# Patient Record
Sex: Male | Born: 1955 | Race: White | Hispanic: No | Smoking: Current every day smoker
Health system: Southern US, Community
[De-identification: ages and names within clinical notes are randomized; demographics above are authoritative.]

## PROBLEM LIST (undated history)

## (undated) DIAGNOSIS — R0600 Dyspnea, unspecified: Secondary | ICD-10-CM

## (undated) DIAGNOSIS — E785 Hyperlipidemia, unspecified: Secondary | ICD-10-CM

## (undated) DIAGNOSIS — R519 Headache, unspecified: Secondary | ICD-10-CM

## (undated) DIAGNOSIS — I10 Essential (primary) hypertension: Secondary | ICD-10-CM

## (undated) DIAGNOSIS — F32A Depression, unspecified: Secondary | ICD-10-CM

## (undated) DIAGNOSIS — N400 Enlarged prostate without lower urinary tract symptoms: Secondary | ICD-10-CM

## (undated) DIAGNOSIS — M6289 Other specified disorders of muscle: Secondary | ICD-10-CM

## (undated) DIAGNOSIS — Z87891 Personal history of nicotine dependence: Secondary | ICD-10-CM

## (undated) DIAGNOSIS — I639 Cerebral infarction, unspecified: Secondary | ICD-10-CM

## (undated) HISTORY — DX: Hyperlipidemia, unspecified: E78.5

## (undated) HISTORY — DX: Personal history of nicotine dependence: Z87.891

## (undated) HISTORY — PX: OTHER SURGICAL HISTORY: SHX169

## (undated) HISTORY — DX: Cerebral infarction, unspecified: I63.9

---

## 2010-03-01 ENCOUNTER — Emergency Department: Payer: Self-pay | Admitting: Unknown Physician Specialty

## 2012-08-20 DIAGNOSIS — I639 Cerebral infarction, unspecified: Secondary | ICD-10-CM

## 2012-08-20 HISTORY — DX: Cerebral infarction, unspecified: I63.9

## 2013-07-08 ENCOUNTER — Observation Stay: Payer: Self-pay | Admitting: Internal Medicine

## 2013-07-08 LAB — CBC WITH DIFFERENTIAL/PLATELET
Basophil #: 0.1 10*3/uL (ref 0.0–0.1)
Basophil %: 0.5 %
Eosinophil #: 0.2 10*3/uL (ref 0.0–0.7)
Eosinophil %: 1.7 %
HCT: 51.8 % (ref 40.0–52.0)
MCH: 33.3 pg (ref 26.0–34.0)
MCHC: 34.5 g/dL (ref 32.0–36.0)
Monocyte #: 1.1 x10 3/mm — ABNORMAL HIGH (ref 0.2–1.0)
Monocyte %: 8.9 %
Neutrophil #: 7.3 10*3/uL — ABNORMAL HIGH (ref 1.4–6.5)
Neutrophil %: 57.6 %
Platelet: 214 10*3/uL (ref 150–440)
RBC: 5.38 10*6/uL (ref 4.40–5.90)
RDW: 13.3 % (ref 11.5–14.5)
WBC: 12.6 10*3/uL — ABNORMAL HIGH (ref 3.8–10.6)

## 2013-07-08 LAB — URINALYSIS, COMPLETE
Bacteria: NONE SEEN
Bilirubin,UR: NEGATIVE
Blood: NEGATIVE
Glucose,UR: NEGATIVE mg/dL (ref 0–75)
Ketone: NEGATIVE
Nitrite: NEGATIVE
Ph: 6 (ref 4.5–8.0)
RBC,UR: <1 /HPF (ref 0–5)
Specific Gravity: 1.014 (ref 1.003–1.030)
Squamous Epithelial: NONE SEEN

## 2013-07-08 LAB — COMPREHENSIVE METABOLIC PANEL
Anion Gap: 5 — ABNORMAL LOW (ref 7–16)
BUN: 9 mg/dL (ref 7–18)
Calcium, Total: 10.4 mg/dL — ABNORMAL HIGH (ref 8.5–10.1)
Co2: 28 mmol/L (ref 21–32)
EGFR (African American): >60
Glucose: 96 mg/dL (ref 65–99)
SGOT(AST): 24 U/L (ref 15–37)
SGPT (ALT): 23 U/L (ref 12–78)
Sodium: 139 mmol/L (ref 136–145)
Total Protein: 7.9 g/dL (ref 6.4–8.2)

## 2013-07-08 LAB — PROTIME-INR: Prothrombin Time: 13.4 secs (ref 11.5–14.7)

## 2013-07-09 DIAGNOSIS — I6789 Other cerebrovascular disease: Secondary | ICD-10-CM

## 2013-07-09 LAB — LIPID PANEL
Cholesterol: 233 mg/dL — ABNORMAL HIGH (ref 0–200)
HDL Cholesterol: 35 mg/dL — ABNORMAL LOW (ref 40–60)
VLDL Cholesterol, Calc: 19 mg/dL (ref 5–40)

## 2013-08-20 HISTORY — PX: COLONOSCOPY: SHX174

## 2014-04-28 ENCOUNTER — Encounter: Payer: Self-pay | Admitting: General Surgery

## 2014-05-14 ENCOUNTER — Ambulatory Visit: Payer: Self-pay | Admitting: Internal Medicine

## 2014-05-14 LAB — CBC CANCER CENTER
Basophil #: 0.1 x10 3/mm (ref 0.0–0.1)
Basophil %: 0.5 %
Eosinophil #: 0.2 x10 3/mm (ref 0.0–0.7)
Eosinophil %: 1.5 %
HCT: 47.9 % (ref 40.0–52.0)
HGB: 15.8 g/dL (ref 13.0–18.0)
Lymphocyte #: 2.8 x10 3/mm (ref 1.0–3.6)
Lymphocyte %: 20.4 %
MCH: 32.9 pg (ref 26.0–34.0)
MCHC: 33 g/dL (ref 32.0–36.0)
MCV: 100 fL (ref 80–100)
MONOS PCT: 9.8 %
Monocyte #: 1.4 x10 3/mm — ABNORMAL HIGH (ref 0.2–1.0)
Neutrophil #: 9.4 x10 3/mm — ABNORMAL HIGH (ref 1.4–6.5)
Neutrophil %: 67.8 %
PLATELETS: 248 x10 3/mm (ref 150–440)
RBC: 4.81 10*6/uL (ref 4.40–5.90)
RDW: 12.9 % (ref 11.5–14.5)
WBC: 13.9 x10 3/mm — ABNORMAL HIGH (ref 3.8–10.6)

## 2014-05-17 ENCOUNTER — Encounter: Payer: Self-pay | Admitting: General Surgery

## 2014-05-17 ENCOUNTER — Ambulatory Visit (INDEPENDENT_AMBULATORY_CARE_PROVIDER_SITE_OTHER): Payer: Medicaid Other | Admitting: General Surgery

## 2014-05-17 VITALS — BP 120/70 | HR 68 | Resp 14 | Ht 68.0 in | Wt 151.0 lb

## 2014-05-17 DIAGNOSIS — Z1211 Encounter for screening for malignant neoplasm of colon: Secondary | ICD-10-CM

## 2014-05-17 DIAGNOSIS — K4090 Unilateral inguinal hernia, without obstruction or gangrene, not specified as recurrent: Secondary | ICD-10-CM

## 2014-05-17 MED ORDER — POLYETHYLENE GLYCOL 3350 17 GM/SCOOP PO POWD: ORAL | Status: DC

## 2014-05-17 NOTE — Progress Notes (Signed)
Patient ID: Alex Bowers, male   DOB: [birth year], 57 y.o.   MRN: [id number]  Chief Complaint  Patient presents with  . Other    right inguinal and discuss colonoscopy    HPI Alex Bowers is a 58 y.o. male here today for a evalaution of a right inguinal hernia and to discuss a colonoscopy screening. Patient states he has no Gi problems at this time. He noticed the knot in his right inguinal area since 2005 . He states it has got bigger in the last ten years. No pain with activity. No family his of colon cancer.  HPI  Past Medical History  Diagnosis Date  . Hyperlipidemia   . Stroke 2014    Past Surgical History  Procedure Laterality Date  . Teeth removal       Family History  Problem Relation Age of Onset  . Cancer - Other Brother     phageal  . Heart attack Father   . Colon polyps Mother     Social History History  Substance Use Topics  . Smoking status: Current Every Day Smoker -- 2.00 packs/day for 37 years    Types: Cigarettes  . Smokeless tobacco: Never Used  . Alcohol Use: Not on file    No Known Allergies  Current Outpatient Prescriptions  Medication Sig Dispense Refill  . amphetamine-dextroamphetamine (ADDERALL XR) 10 MG 24 hr capsule Take 10 mg by mouth daily.      . Ascorbic Acid (VITAMIN C) 1000 MG tablet Take 1,000 mg by mouth 2 (two) times daily.      Marland Kitchen aspirin EC 81 MG tablet Take 81 mg by mouth daily.      . clopidogrel (PLAVIX) 75 MG tablet Take 75 mg by mouth daily.      . Fish Oil-Cholecalciferol (FISH OIL + D3 PO) Take by mouth.      Marland Kitchen lisinopril (PRINIVIL,ZESTRIL) 10 MG tablet Take 10 mg by mouth daily.      . simvastatin (ZOCOR) 40 MG tablet Take 40 mg by mouth daily.      . polyethylene glycol powder (GLYCOLAX/MIRALAX) powder 255 grams one bottle for colonoscopy prep  255 g  0   No current facility-administered medications for this visit.    Review of Systems Review of Systems  Constitutional: Negative.   Respiratory:  Negative.   Cardiovascular: Negative.   Gastrointestinal: Negative.     Blood pressure 120/70, pulse 68, resp. rate 14, height 5\' 8"  (1.727 m), weight 151 lb (68.493 kg).  Physical Exam Physical Exam  Constitutional: He is oriented to person, place, and time. He appears well-developed and well-nourished.  Eyes: Conjunctivae are normal.  Neck: Neck supple.  Cardiovascular: Normal rate, regular rhythm and normal heart sounds.   Pulmonary/Chest: Effort normal and breath sounds normal.  Abdominal: Soft. Normal appearance and bowel sounds are normal. There is no hepatosplenomegaly or hepatomegaly. There is no tenderness. No hernia ( sizeable right inguinal hernia reducing spontaneously with the patient is in the supine position). Hernia confirmed negative in the left inguinal area.  Neurological: He is alert and oriented to person, place, and time.  Skin: Skin is dry.    Data Reviewed PCP Notes.  Laboratory studies is March 23, 2014 showed white blood cell count 11,900 with normal differential, hemoglobin 15.6. Platelet count 227,000. Creatinine 0.7 with an estimated GFR of 103. Normal electrolytes. Normal liver function studies. Normal B12 and folate. Normal serum iron and ferritin.  Assessment    1) large right inguinal hernia,  reducible. 2) candidate for screening colonoscopy.     Plan    Patient has been scheduled for a colonoscopy and right inguinal hernia repair on 06-09-14 at [facility].  This patient has been asked to stop Plavix one week prior to procedure. He will also stop fish oil one week prior. It is okay for patient to continue 81 mg aspirin.   PCP: Lonna Duval 05/18/2014, 8:35 PM

## 2014-05-17 NOTE — Patient Instructions (Addendum)
Inguinal Hernia, Adult Muscles help keep everything in the body in its proper place. But if a weak spot in the muscles develops, something can poke through. That is called a hernia. When this happens in the lower part of the belly (abdomen), it is called an inguinal hernia. (It takes its name from a part of the body in this region called the inguinal canal.) A weak spot in the wall of muscles lets some fat or part of the small intestine bulge through. An inguinal hernia can develop at any age. Men get them more often than women. CAUSES  In adults, an inguinal hernia develops over time.  It can be triggered by:  Suddenly straining the muscles of the lower abdomen.  Lifting heavy objects.  Straining to have a bowel movement. Difficult bowel movements (constipation) can lead to this.  Constant coughing. This may be caused by smoking or lung disease.  Being overweight.  Being pregnant.  Working at a job that requires long periods of standing or heavy lifting.  Having had an inguinal hernia before. One type can be an emergency situation. It is called a strangulated inguinal hernia. It develops if part of the small intestine slips through the weak spot and cannot get back into the abdomen. The blood supply can be cut off. If that happens, part of the intestine may die. This situation requires emergency surgery. SYMPTOMS  Often, a small inguinal hernia has no symptoms. It is found when a healthcare provider does a physical exam. Larger hernias usually have symptoms.   In adults, symptoms may include:  A lump in the groin. This is easier to see when the person is standing. It might disappear when lying down.  In men, a lump in the scrotum.  Pain or burning in the groin. This occurs especially when lifting, straining or coughing.  A dull ache or feeling of pressure in the groin.  Signs of a strangulated hernia can include:  A bulge in the groin that becomes very painful and tender to the  touch.  A bulge that turns red or purple.  Fever, nausea and vomiting.  Inability to have a bowel movement or to pass gas. DIAGNOSIS  To decide if you have an inguinal hernia, a healthcare provider will probably do a physical examination.  This will include asking questions about any symptoms you have noticed.  The healthcare provider might feel the groin area and ask you to cough. If an inguinal hernia is felt, the healthcare provider may try to slide it back into the abdomen.  Usually no other tests are needed. TREATMENT  Treatments can vary. The size of the hernia makes a difference. Options include:  Watchful waiting. This is often suggested if the hernia is small and you have had no symptoms.  No medical procedure will be done unless symptoms develop.  You will need to watch closely for symptoms. If any occur, contact your healthcare provider right away.  Surgery. This is used if the hernia is larger or you have symptoms.  Open surgery. This is usually an outpatient procedure (you will not stay overnight in a hospital). An cut (incision) is made through the skin in the groin. The hernia is put back inside the abdomen. The weak area in the muscles is then repaired by herniorrhaphy or hernioplasty. Herniorrhaphy: in this type of surgery, the weak muscles are sewn back together. Hernioplasty: a patch or mesh is used to close the weak area in the abdominal wall.  Laparoscopy.   In this procedure, a surgeon makes small incisions. A thin tube with a tiny video camera (called a laparoscope) is put into the abdomen. The surgeon repairs the hernia with mesh by looking with the video camera and using two long instruments. HOME CARE INSTRUCTIONS   After surgery to repair an inguinal hernia:  You will need to take pain medicine prescribed by your healthcare provider. Follow all directions carefully.  You will need to take care of the wound from the incision.  Your activity will be  restricted for awhile. This will probably include no heavy lifting for several weeks. You also should not do anything too active for a few weeks. When you can return to work will depend on the type of job that you have.  During "watchful waiting" periods, you should:  Maintain a healthy weight.  Eat a diet high in fiber (fruits, vegetables and whole grains).  Drink plenty of fluids to avoid constipation. This means drinking enough water and other liquids to keep your urine clear or pale yellow.  Do not lift heavy objects.  Do not stand for long periods of time.  Quit smoking. This should keep you from developing a frequent cough. SEEK MEDICAL CARE IF:   A bulge develops in your groin area.  You feel pain, a burning sensation or pressure in the groin. This might be worse if you are lifting or straining.  You develop a fever of more than 100.5 F (38.1 C). SEEK IMMEDIATE MEDICAL CARE IF:   Pain in the groin increases suddenly.  A bulge in the groin gets bigger suddenly and does not go down.  For men, there is sudden pain in the scrotum. Or, the size of the scrotum increases.  A bulge in the groin area becomes red or purple and is painful to touch.  You have nausea or vomiting that does not go away.  You feel your heart beating much faster than normal.  You cannot have a bowel movement or pass gas.  You develop a fever of more than 102.0 F (38.9 C). Document Released: 12/23/2008 Document Revised: 10/29/2011 Document Reviewed: 12/23/2008 [facility] Patient Information 2015 [place], [place]. This information is not intended to replace advice given to you by your health care provider. Make sure you discuss any questions you have with your health care provider. Colonoscopy A colonoscopy is an exam to look at the entire large intestine (colon). This exam can help find problems such as tumors, polyps, inflammation, and areas of bleeding. The exam takes about 1 hour.  LET [facility]  CARE PROVIDER KNOW ABOUT:   Any allergies you have.  All medicines you are taking, including vitamins, herbs, eye drops, creams, and over-the-counter medicines.  Previous problems you or members of your family have had with the use of anesthetics.  Any blood disorders you have.  Previous surgeries you have had.  Medical conditions you have. RISKS AND COMPLICATIONS  Generally, this is a safe procedure. However, as with any procedure, complications can occur. Possible complications include:  Bleeding.  Tearing or rupture of the colon wall.  Reaction to medicines given during the exam.  Infection (rare). BEFORE THE PROCEDURE   Ask your health care provider about changing or stopping your regular medicines.  You may be prescribed an oral bowel prep. This involves drinking a large amount of medicated liquid, starting the day before your procedure. The liquid will cause you to have multiple loose stools until your stool is almost clear or light green.  This cleans out your colon in preparation for the procedure.  Do not eat or drink anything else once you have started the bowel prep, unless your health care provider tells you it is safe to do so.  Arrange for someone to drive you home after the procedure. PROCEDURE   You will be given medicine to help you relax (sedative).  You will lie on your side with your knees bent.  A long, flexible tube with a light and camera on the end (colonoscope) will be inserted through the rectum and into the colon. The camera sends video back to a computer screen as it moves through the colon. The colonoscope also releases carbon dioxide gas to inflate the colon. This helps your health care provider see the area better.  During the exam, your health care provider may take a small tissue sample (biopsy) to be examined under a microscope if any abnormalities are found.  The exam is finished when the entire colon has been viewed. AFTER THE PROCEDURE     Do not drive for 24 hours after the exam.  You may have a small amount of blood in your stool.  You may pass moderate amounts of gas and have mild abdominal cramping or bloating. This is caused by the gas used to inflate your colon during the exam.  Ask when your test results will be ready and how you will get your results. Make sure you get your test results. Document Released: 08/03/2000 Document Revised: 05/27/2013 Document Reviewed: 04/13/2013 [facility] Patient Information 2015 [place], [place]. This information is not intended to replace advice given to you by your health care provider. Make sure you discuss any questions you have with your health care provider.  Patient has been scheduled for a colonoscopy and right inguinal hernia repair on 06-09-14 at [facility].  This patient has been asked to stop Plavix one week prior to procedure. He will also stop fish oil one week prior. It is okay for patient to continue 81 mg aspirin.

## 2014-05-18 ENCOUNTER — Other Ambulatory Visit: Payer: Self-pay | Admitting: General Surgery

## 2014-05-18 DIAGNOSIS — Z0001 Encounter for general adult medical examination with abnormal findings: Secondary | ICD-10-CM | POA: Insufficient documentation

## 2014-05-18 DIAGNOSIS — K4090 Unilateral inguinal hernia, without obstruction or gangrene, not specified as recurrent: Secondary | ICD-10-CM

## 2014-05-18 DIAGNOSIS — Z1211 Encounter for screening for malignant neoplasm of colon: Secondary | ICD-10-CM

## 2014-05-20 ENCOUNTER — Ambulatory Visit: Payer: Self-pay | Admitting: General Surgery

## 2014-05-20 ENCOUNTER — Ambulatory Visit: Payer: Self-pay | Admitting: Internal Medicine

## 2014-05-26 LAB — CBC CANCER CENTER
Basophil #: 0.1 x10 3/mm (ref 0.0–0.1)
Basophil %: 0.6 %
EOS ABS: 0.3 x10 3/mm (ref 0.0–0.7)
EOS PCT: 2.7 %
HCT: 45 % (ref 40.0–52.0)
HGB: 15.1 g/dL (ref 13.0–18.0)
LYMPHS PCT: 28.5 %
Lymphocyte #: 2.9 x10 3/mm (ref 1.0–3.6)
MCH: 32.9 pg (ref 26.0–34.0)
MCHC: 33.5 g/dL (ref 32.0–36.0)
MCV: 98 fL (ref 80–100)
MONOS PCT: 9.5 %
Monocyte #: 1 x10 3/mm (ref 0.2–1.0)
Neutrophil #: 5.9 x10 3/mm (ref 1.4–6.5)
Neutrophil %: 58.7 %
Platelet: 218 x10 3/mm (ref 150–440)
RBC: 4.58 10*6/uL (ref 4.40–5.90)
RDW: 12.1 % (ref 11.5–14.5)
WBC: 10.1 x10 3/mm (ref 3.8–10.6)

## 2014-06-09 ENCOUNTER — Ambulatory Visit: Payer: Self-pay | Admitting: General Surgery

## 2014-06-09 DIAGNOSIS — K4090 Unilateral inguinal hernia, without obstruction or gangrene, not specified as recurrent: Secondary | ICD-10-CM

## 2014-06-09 DIAGNOSIS — D122 Benign neoplasm of ascending colon: Secondary | ICD-10-CM

## 2014-06-09 HISTORY — PX: HERNIA REPAIR: SHX51

## 2014-06-10 ENCOUNTER — Encounter: Payer: Self-pay | Admitting: General Surgery

## 2014-06-14 ENCOUNTER — Encounter: Payer: Self-pay | Admitting: General Surgery

## 2014-06-16 ENCOUNTER — Ambulatory Visit (INDEPENDENT_AMBULATORY_CARE_PROVIDER_SITE_OTHER): Payer: Self-pay | Admitting: General Surgery

## 2014-06-16 ENCOUNTER — Encounter: Payer: Self-pay | Admitting: General Surgery

## 2014-06-16 VITALS — BP 130/78 | HR 74 | Resp 12 | Ht 68.0 in | Wt 150.0 lb

## 2014-06-16 DIAGNOSIS — K4090 Unilateral inguinal hernia, without obstruction or gangrene, not specified as recurrent: Secondary | ICD-10-CM

## 2014-06-16 NOTE — Patient Instructions (Signed)
Proper lifting techniques reviewed. Patient to return as needed.  

## 2014-06-16 NOTE — Progress Notes (Signed)
Patient ID: Alex Bowers, male   DOB: 02/21/1956, 58 y.o.   MRN: [id number]  Chief Complaint  Patient presents with  . Routine Post Op    inguinal hernia repair    HPI Alex Bowers is a 58 y.o. male. here today for his post op right inguinal hernia repair and colonoscopy done on 06/09/14. Patient states he is doing well .HPI  Past Medical History  Diagnosis Date  . Hyperlipidemia   . Stroke 2014    Past Surgical History  Procedure Laterality Date  . Teeth removal     . Hernia repair Right 06/09/14    inguinal   . Colonoscopy  2015    Family History  Problem Relation Age of Onset  . Cancer - Other Brother     phageal  . Heart attack Father   . Colon polyps Mother     Social History History  Substance Use Topics  . Smoking status: Current Every Day Smoker -- 2.00 packs/day for 37 years    Types: Cigarettes  . Smokeless tobacco: Never Used  . Alcohol Use: Not on file    No Known Allergies  Current Outpatient Prescriptions  Medication Sig Dispense Refill  . amphetamine-dextroamphetamine (ADDERALL XR) 10 MG 24 hr capsule Take 10 mg by mouth daily.      . Ascorbic Acid (VITAMIN C) 1000 MG tablet Take 1,000 mg by mouth 2 (two) times daily.      Marland Kitchen aspirin EC 81 MG tablet Take 81 mg by mouth daily.      . clopidogrel (PLAVIX) 75 MG tablet Take 75 mg by mouth daily.      . Fish Oil-Cholecalciferol (FISH OIL + D3 PO) Take by mouth.      Marland Kitchen lisinopril (PRINIVIL,ZESTRIL) 10 MG tablet Take 10 mg by mouth daily.      . polyethylene glycol powder (GLYCOLAX/MIRALAX) powder 255 grams one bottle for colonoscopy prep  255 g  0  . simvastatin (ZOCOR) 40 MG tablet Take 40 mg by mouth daily.       No current facility-administered medications for this visit.    Review of Systems Review of Systems  Constitutional: Negative.   Respiratory: Negative.   Cardiovascular: Negative.     Blood pressure 130/78, pulse 74, resp. rate 12, height 5\' 8"  (1.727 m), weight  150 lb (68.04 kg).  Physical Exam Physical Exam  Constitutional: He is oriented to person, place, and time. He appears well-developed and well-nourished.  Abdominal:  Incision looks clean and healing well.   Neurological: He is alert and oriented to person, place, and time.  Skin: Skin is warm and dry.    Data Reviewed Polyps removed from the descending colon and from the sigmoid at 40 cm were both tubular adenomas. No high-grade dysplasia.    Assessment    Doing well post hernia repair. Tubular adenomas of the colon.      Plan    Patient to return as needed regarding his hernia. A repeat exam in 5 years will be recommended.      PCP: Vanetta Shawl 06/17/2014, 8:12 PM

## 2014-06-20 ENCOUNTER — Ambulatory Visit: Payer: Self-pay | Admitting: Internal Medicine

## 2014-06-23 ENCOUNTER — Ambulatory Visit: Payer: Self-pay | Admitting: Hospice and Palliative Medicine

## 2014-06-23 LAB — CBC CANCER CENTER
BASOS ABS: 0.1 x10 3/mm (ref 0.0–0.1)
Basophil %: 0.5 %
EOS ABS: 0.3 x10 3/mm (ref 0.0–0.7)
Eosinophil %: 2.3 %
HCT: 47.6 % (ref 40.0–52.0)
HGB: 15.9 g/dL (ref 13.0–18.0)
LYMPHS ABS: 3.2 x10 3/mm (ref 1.0–3.6)
LYMPHS PCT: 26.4 %
MCH: 33.2 pg (ref 26.0–34.0)
MCHC: 33.3 g/dL (ref 32.0–36.0)
MCV: 100 fL (ref 80–100)
MONO ABS: 1.1 x10 3/mm — AB (ref 0.2–1.0)
Monocyte %: 9.1 %
Neutrophil #: 7.4 x10 3/mm — ABNORMAL HIGH (ref 1.4–6.5)
Neutrophil %: 61.7 %
PLATELETS: 228 x10 3/mm (ref 150–440)
RBC: 4.78 10*6/uL (ref 4.40–5.90)
RDW: 12.2 % (ref 11.5–14.5)
WBC: 12.1 x10 3/mm — ABNORMAL HIGH (ref 3.8–10.6)

## 2014-07-08 DIAGNOSIS — D239 Other benign neoplasm of skin, unspecified: Secondary | ICD-10-CM

## 2014-07-08 HISTORY — DX: Other benign neoplasm of skin, unspecified: D23.9

## 2014-07-19 LAB — CBC CANCER CENTER
Basophil #: 0 x10 3/mm (ref 0.0–0.1)
Basophil %: 0.3 %
Eosinophil #: 0.2 x10 3/mm (ref 0.0–0.7)
Eosinophil %: 1.4 %
HCT: 49.1 % (ref 40.0–52.0)
HGB: 16.4 g/dL (ref 13.0–18.0)
Lymphocyte #: 3 x10 3/mm (ref 1.0–3.6)
Lymphocyte %: 23.8 %
MCH: 33 pg (ref 26.0–34.0)
MCHC: 33.3 g/dL (ref 32.0–36.0)
MCV: 99 fL (ref 80–100)
MONO ABS: 1 x10 3/mm (ref 0.2–1.0)
MONOS PCT: 8.3 %
Neutrophil #: 8.3 x10 3/mm — ABNORMAL HIGH (ref 1.4–6.5)
Neutrophil %: 66.2 %
PLATELETS: 210 x10 3/mm (ref 150–440)
RBC: 4.96 10*6/uL (ref 4.40–5.90)
RDW: 12.7 % (ref 11.5–14.5)
WBC: 12.6 x10 3/mm — ABNORMAL HIGH (ref 3.8–10.6)

## 2014-07-20 ENCOUNTER — Ambulatory Visit: Payer: Self-pay | Admitting: Internal Medicine

## 2014-12-10 NOTE — Discharge Summary (Signed)
PATIENT NAME:  [name], Alex Bowers MR#:  [id number] DATE OF BIRTH:  1956/07/26  DATE OF ADMISSION:  07/08/2013 DATE OF DISCHARGE:  07/10/2013  PRIMARY CARE PHYSICIAN: Will be at the [facility].   FINAL DIAGNOSES: 1.  Acute cerebrovascular accident affecting the patient's speech.  2.  Malignant hypertension.  3.  Hyperlipidemia.  4.  Tobacco abuse.   MEDICATIONS ON DISCHARGE: Include aspirin 81 mg daily, nicotine patch 14 mg per 24 hours 1 patch daily, Pravachol 40 mg at bedtime, lisinopril 2.5 mg p.o. daily to be started as outpatient.   DIET: Low-sodium diet, regular consistency.   ACTIVITY: As tolerated.   FOLLOWUP: In 2 to 4 weeks at the [facility].   HOSPITAL COURSE: The patient was admitted July 08, 2013. Was discharged July 10, 2013.   LABORATORY AND RADIOLOGICAL DATA: PT 13.4. INR 1.0, PTT 31.5. Urinalysis negative. Glucose 96, BUN 9, creatinine 0.78, sodium 138, potassium 3.7, chloride 106, CO2 of 28, calcium 10.4. Liver function tests: Normal range. White blood cell count 12.6, hemoglobin and hematocrit 17.9 and 51.8, platelet count of 214. CT scan of the head: Atrophy with mild periventricular small vessel disease. Chest x-ray unremarkable. LDL 179. Echo: EF 60% to 65%, no cardiac source of emboli detected. Ultrasound: Bilateral plaque subjectively, left greater than right, no hemodynamic significance. MRI brain: Acute small nonhemorrhagic infarct posterior aspect of the left lenticular nucleus extending into the posterior aspect of the left corona radiata.  HOSPITAL COURSE PER PROBLEM LIST:  1.  For the patient's acute CVA, this only affected his speech. He was still having some speech issues. He was seen in consultation by speech therapy. No problem swallowing. He was advised to slow down his thoughts and his speaking. Can follow up as outpatient if needed, but I no cost is going to be an issue for him. He was started on aspirin and pravastatin.  2.   Malignant hypertension: No blood pressure medications here in the hospital. Will start low-dose lisinopril 2.5 mg as outpatient. Blood pressure in the hospital as high as 190/109, upon discharge 146/95. 3.  For his hyperlipidemia, LDL was 179. We started pravastatin.  4.  For his tobacco abuse, smoking cessation counseling done 3 minutes by me. Nicotine patch applied.  Time spent on discharge on the day of discharge: Less than 30 minutes.   ____________________________ Tana Conch. Leslye Peer, MD rjw:jcm D: 07/10/2013 17:34:20 ET T: 07/10/2013 18:27:46 ET JOB#: [id number]  cc: Tana Conch. Leslye Peer, MD, <Dictator> [facility] MD ELECTRONICALLY SIGNED 07/14/2013 14:19

## 2014-12-10 NOTE — H&P (Signed)
PATIENT NAME:  Alex Bowers, Alex Bowers MR#:  [id number] DATE OF BIRTH:  1956/02/19  DATE OF ADMISSION:  07/08/2013  PRIMARY CARE PHYSICIAN: Does not have one.   CHIEF COMPLAINT: Slurred speech.   HISTORY OF PRESENT ILLNESS: This is a 59 year old male who presents to the hospital complaining of slurred speech that began yesterday around 4:00 in the morning. The patient says that he thought it would improve, although his slurred speech persisted. He also developed some left upper extremity numbness, and therefore came to the ER for further evaluation. The patient was noted to have systolic blood pressures in triage of greater than 190, and he has no history of hypertension. The patient's symptoms are worrisome for a possible stroke, and therefore  hospitalist services were contacted for further treatment and evaluation. The patient denies any headache. He denies any nausea, vomiting, chest pain, shortness of breath or any other associated symptoms presently.   REVIEW OF SYSTEMS:  CONSTITUTIONAL: No documented fever. No weight gain. No weight loss.  EYES: No blurred or double vision.  ENT: No tinnitus. No postnasal drip. No redness of the oropharynx.  RESPIRATORY: No cough. No wheeze. No hemoptysis. No dyspnea.  CARDIOVASCULAR: No chest pain. No orthopnea. No palpitations or syncope.  GASTROINTESTINAL: No nausea. No vomiting. No diarrhea. No abdominal pain. No melena or hematochezia.  GENITOURINARY: No dysuria or hematuria.  ENDOCRINE: No polyuria or nocturia. No heat or cold intolerance.   HEMATOLOGIC: No anemia. No bruising. No bleeding.  INTEGUMENTARY: No rashes or lesions.  MUSCULOSKELETAL: No arthritis. No swelling. No gout.  NEUROLOGIC: No numbness. No tingling. No ataxia. No seizure-type activity. Positive slurred speech.  PSYCHIATRIC: No anxiety. No insomnia. No ADD.   PAST MEDICAL HISTORY:  He has no significant past medical history.   ALLERGIES: No known drug allergies.   SOCIAL  HISTORY: Does smoke about a pack and a half per day, has been smoking for the past 20 to 30 years. Does drink 4 to 6 beers daily. No other illicit drug abuse. Lives at home by himself.   FAMILY HISTORY: The patient's mother is deceased. She died from complications of liver cancer. Father is alive, has high blood pressure and angina.   CURRENT MEDICATIONS: He is on no medications.   PHYSICAL EXAMINATION: Presently is as follows:  VITAL SIGNS:  Noted to be: Temperature is 97.5, pulse 61, respirations 18, blood pressure 166/96, sats 99% on room air.  GENERAL: The patient is a pleasant-appearing male in no apparent distress.  HEAD, EYES, EARS, NOSE, THROAT: He is atraumatic, normocephalic. Extraocular muscles are intact. Pupils are equal and reactive to light. Sclerae anicteric. No conjunctival injection. No pharyngeal erythema.  NECK: Supple. There is no jugular venous distention. No bruits. No lymphadenopathy or thyromegaly.  HEART: Regular rate and rhythm. No murmurs. No rubs. No clicks.  LUNGS: Clear to auscultation bilaterally. No rales or rhonchi. No wheezes.  ABDOMEN: Soft, flat, nontender, nondistended. Has good bowel sounds. No hepatosplenomegaly appreciated.  EXTREMITIES: No evidence of any cyanosis, clubbing or peripheral edema. Has +2 pedal and radial pulses bilaterally.  NEUROLOGIC: The patient is alert, awake and oriented x 3 with no focal motor or sensory deficits appreciated bilaterally.  SKIN: Moist, warm with no rashes appreciated.  LYMPHATIC: There is no cervical or axillary lymphadenopathy.   LABORATORY DATA: Showed a serum glucose of 96, BUN 9, creatinine 0.7, sodium 139, potassium 3.7, chloride 106, bicarb 28. LFTs are within normal limits. White cell count 12.6, hemoglobin 17.9, hematocrit 51.8, platelet count  214. INR is 1.0. Urinalysis within normal limits.   The patient did have a CT of the head done without contrast, which showed mild atrophy with mild periventricular  small vessel disease, and a chest x-ray which showed no evidence of any acute cardiopulmonary disease.   ASSESSMENT AND PLAN: This is a 59 year old male with a long-term history of tobacco abuse, who presents to the hospital with slurred speech that has been ongoing since yesterday.  1.  Slurred speech/cerebrovascular accident. The patient does have significant risk factors given his family history of stroke and also presented with significantly elevated blood pressures, which have not been treated, as he probably has undiagnosed hypertension. Initial CT head is negative. I will observe him overnight, follow q.4 hour neuro checks. I will get an MRI brain, carotid duplex and echocardiogram. Continue him on his aspirin and check a lipid panel in the morning.  2.  Hypertension. The patient has no significant history of high blood pressure. Will tolerate some elevated BP given the possibility of acute CVA.  Systolic blood pressure goal should be 160. Diastolic about 90. I will place on p.r.n. hydralazine for now. The patient may need to be on some meds prior to discharge.  3.  Tobacco abuse. I will place him on a nicotine patch.   The patient is a FULL CODE.   Time spent on admission is 45 minutes.    ____________________________ Belia Heman. Verdell Carmine, MD vjs:[id number] D: 07/08/2013 20:52:34 ET T: 07/08/2013 21:08:36 ET JOB#: [id number]  cc: Belia Heman. Verdell Carmine, MD, <Dictator> Henreitta Leber MD ELECTRONICALLY SIGNED 07/25/2013 18:32

## 2014-12-11 NOTE — Op Note (Signed)
PATIENT NAME:  Alex Bowers, Alex Bowers MR#:  [id number] DATE OF BIRTH:  1956/01/14  DATE OF PROCEDURE:  06/09/2014  PREOPERATIVE DIAGNOSIS: Right inguinal hernia.   POSTOPERATIVE DIAGNOSIS: Right inguinal hernia, direct.   OPERATIVE PROCEDURE: Right inguinal hernia repair with medium Ultrapro mesh.   SURGEON: Robert Bellow, MD.   ANESTHESIA: General by LMA, Marcaine 0.5% with 1:200,000 units of epinephrine, 30 mL local infiltration, Toradol 30 mg.   CLINICAL NOTE: This 59 year old male has developed a symptomatic right inguinal hernia. He was admitted for elective repair. He underwent colonoscopy today with identification of 2 polyps. He received Kefzol prior to the procedure.   OPERATIVE NOTE: With the patient under adequate general anesthesia, the abdomen and groin were prepped with ChloraPrep and draped. A 5-cm skin line incision along the anticipated course of the inguinal canal was carried down through the skin and subcutaneous tissue with hemostasis achieved by electrocautery. The external oblique was opened in the direction of its fibers and the cord mobilized. Dissection at the level of the internal ring showed no evidence of an indirect defect.   There was a large direct defect. This was dissected free of adjacent structures and the hernia sac excised and discarded. The preperitoneal space was cleared and a medium Ultrapro mesh smoothed into position. The external component was anchored to the pubic tubercle with 0-Surgilon and then along the inguinal ligament with interrupted 0-Surgilon sutures. The medial and superior borders were anchored to the transverse abdominis aponeurosis in a similar fashion. A lateral slit was made for cord passage. Toradol was placed into the wound for postoperative analgesia. The external oblique was closed with a running 2-0 Vicryl suture after return of the iliohypogastric nerve to the field. Scarpa fascia was closed with a running 3-0 Vicryl and the skin  closed with running 4-0 Vicryl subcuticular suture.  A dressing of Benzoin, Steri-Strips, Telfa and Tegaderm was then applied.    The patient tolerated the procedure well and was taken to the recovery room in stable condition.    ____________________________ Robert Bellow, MD jwb:lt D: 06/09/2014 21:14:00 ET T: 06/10/2014 09:36:46 ET JOB#: [id number]  cc: Leighton Ruff. Radford Pax, PA-C Robert Bellow, MD, <Dictator>   JEFFREY Amedeo Kinsman MD ELECTRONICALLY SIGNED 06/11/2014 9:09

## 2014-12-13 LAB — SURGICAL PATHOLOGY

## 2015-03-11 ENCOUNTER — Encounter: Payer: Self-pay | Admitting: *Deleted

## 2015-03-11 ENCOUNTER — Telehealth: Payer: Self-pay | Admitting: *Deleted

## 2015-03-11 NOTE — Telephone Encounter (Signed)
Dr. Marylene Land office note from 07/19/2014 and a letter of release faxed to Dr. Clayborn Bigness and Leta Baptist, P.A. With [facility]. Reminders to check pt's CBC every 6 months (due now) and CT of the chest yearly, included.Marland KitchenMarland Kitchen

## 2015-03-29 IMAGING — CT CT CHEST W/O CM SCREENING
2 of 3 series · 15 of 36 positions shown, 18 images · non-contrast
Comparison: None.

CLINICAL DATA: Current smoker, 45 pack-year history, lung cancer
screening.

EXAM:
CT CHEST WITHOUT CONTRAST
TECHNIQUE: Multidetector CT imaging of the chest was performed following the
standard protocol without IV contrast..

[Series 2: routine chest wo · axial · 0.70mm/px · z∈[-625,-360]mm · 12 of 63 slices shown, 15 images]
[im 5/63  mediastinal]
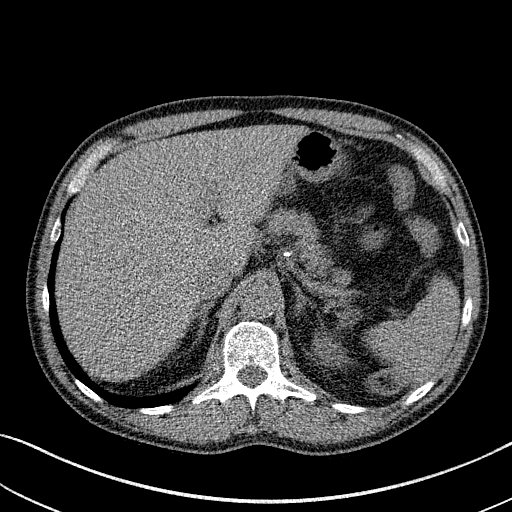
[im 5/63  lung]
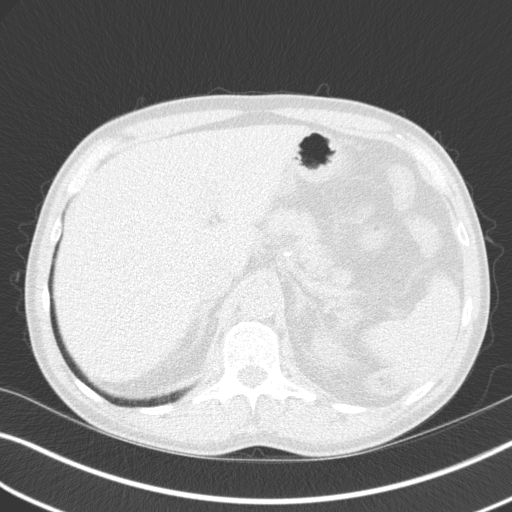
[im 10/63  lung]
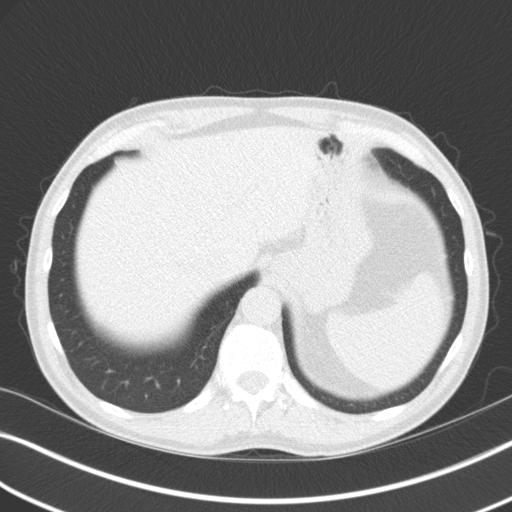
[im 14/63  lung]
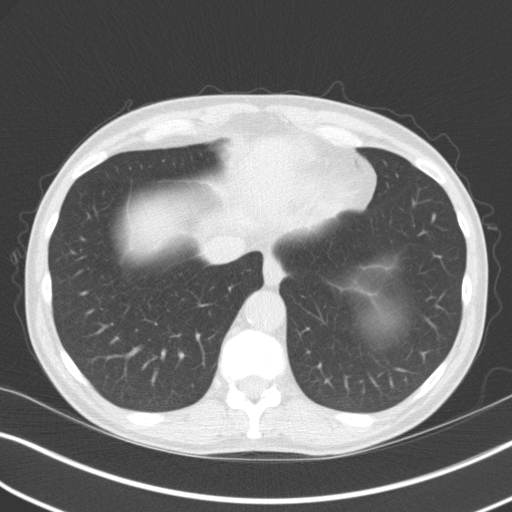
[im 19/63  lung]
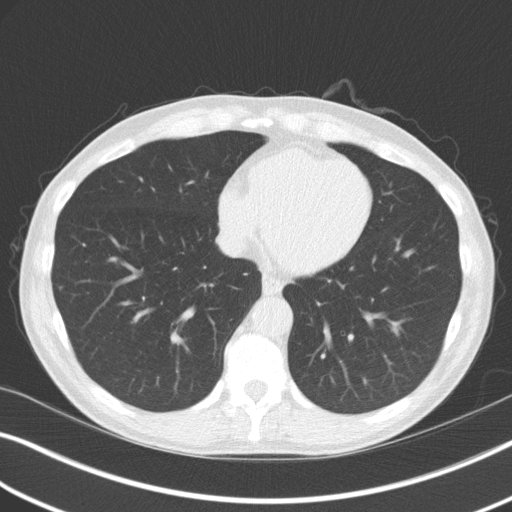
[im 23/63  mediastinal]
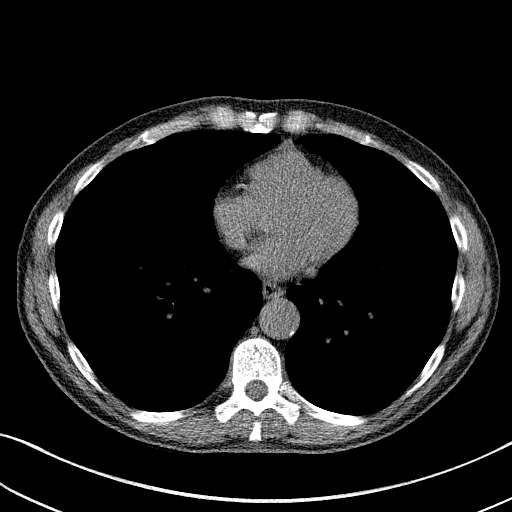
[im 23/63  lung]
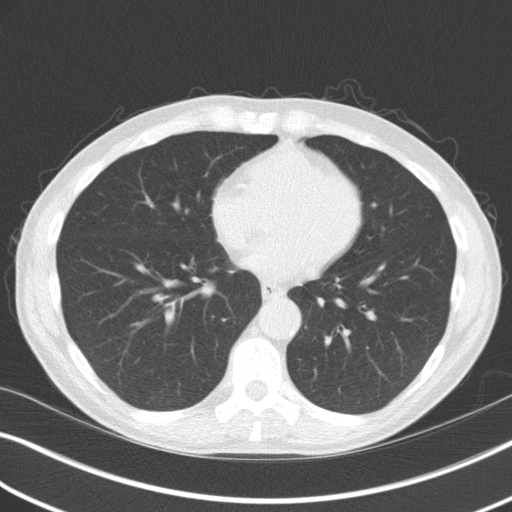
[im 28/63  lung]
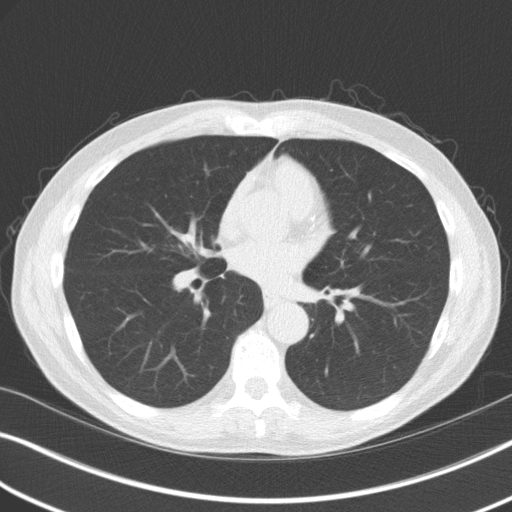
[im 35/63  lung]
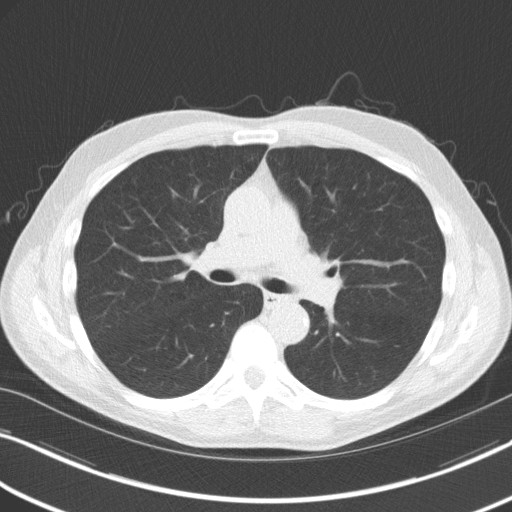
[im 40/63  lung]
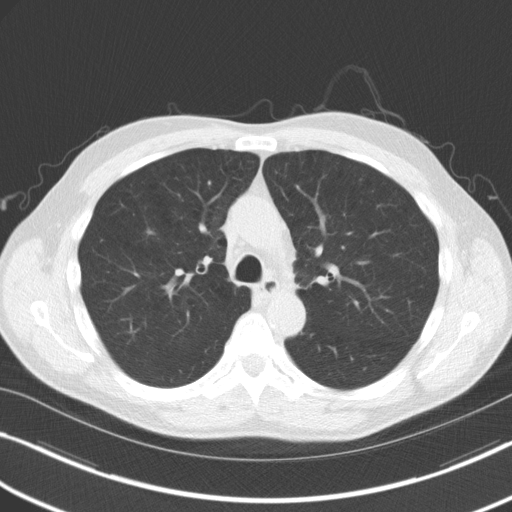
[im 44/63  mediastinal]
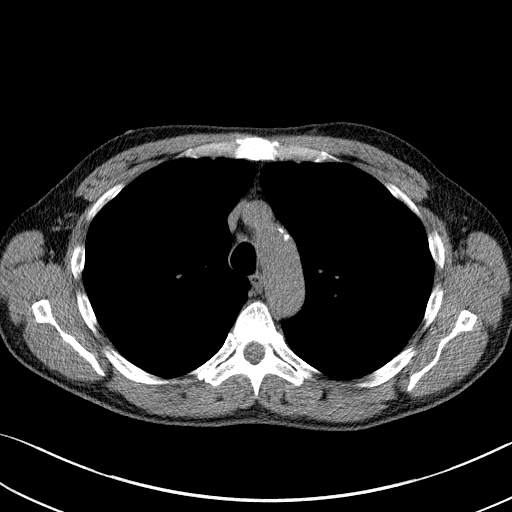
[im 44/63  lung]
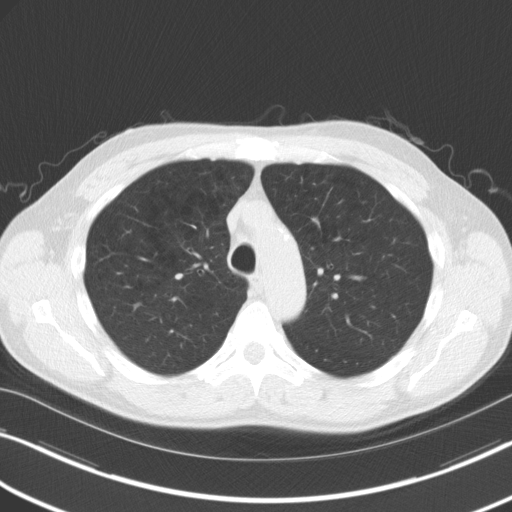
[im 49/63  lung]
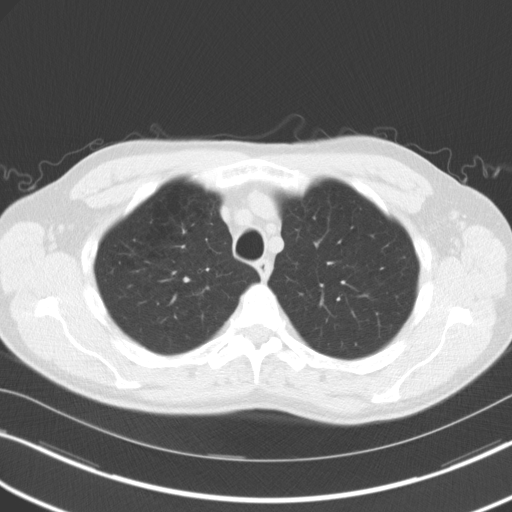
[im 53/63  lung]
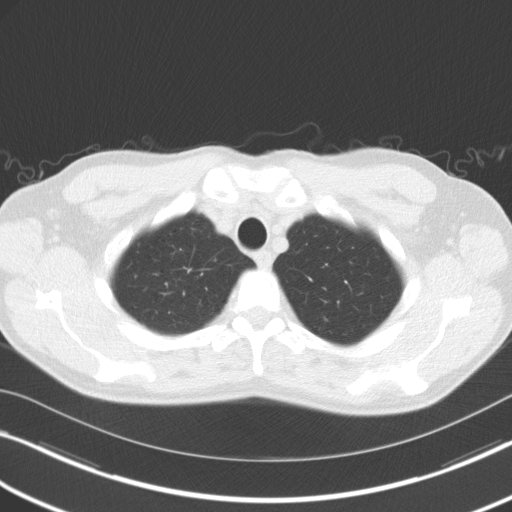
[im 58/63  lung]
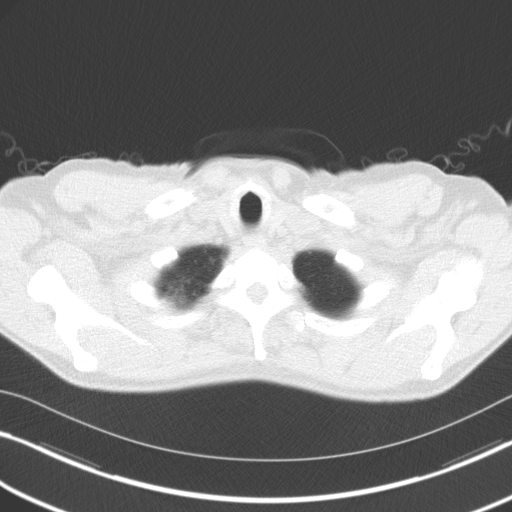

[Series 5: routine chest wo cor · coronal · 0.63mm/px · 3 of 267 slices shown]
[im 54/267  lung]
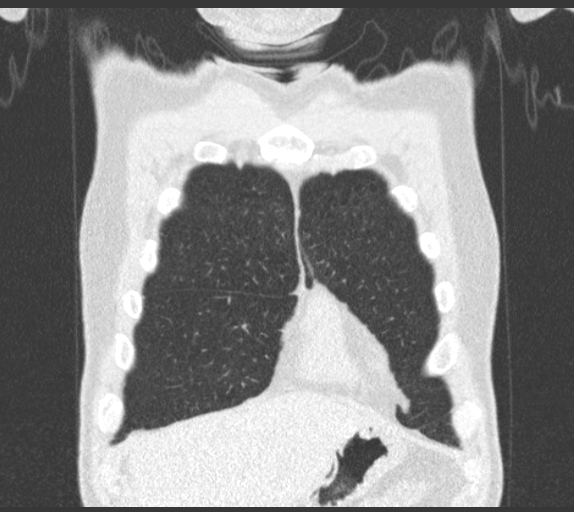
[im 107/267  lung]
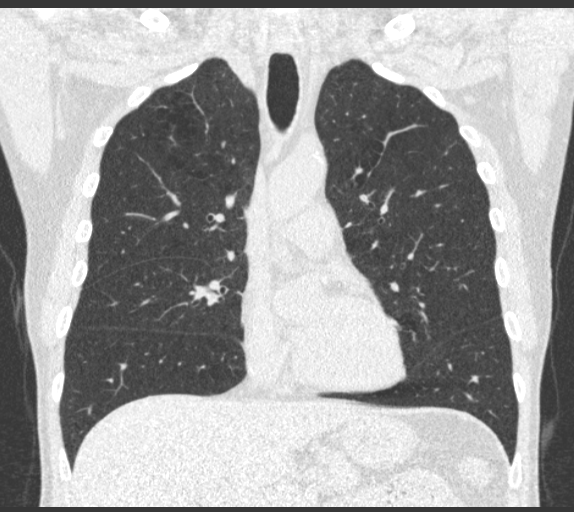
[im 160/267  lung]
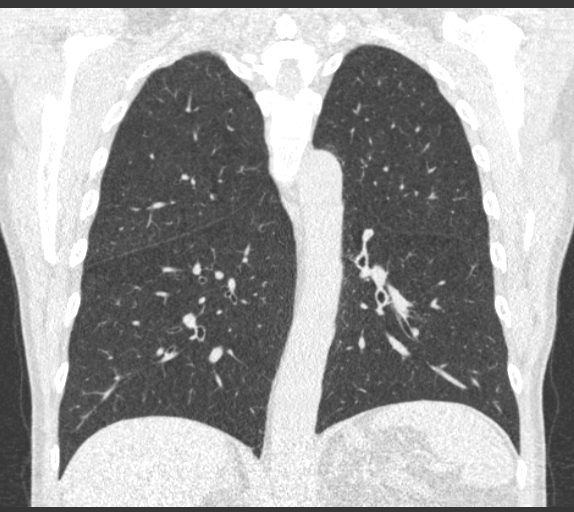

[15 of 36 positions shown; findings below may reference images not displayed]

FINDINGS: No pathologically enlarged mediastinal or axillary lymph nodes.
Hilar regions are difficult to definitively evaluate without IV
contrast but appear grossly unremarkable. Coronary artery
calcification. Heart size normal. No pericardial effusion.

Biapical pleural parenchymal scarring. Mild centrilobular emphysema.
Scattered pulmonary nodular densities measure 4 mm or less in size.
No pleural fluid. Airway is unremarkable.

Incidental imaging of the upper abdomen shows the visualized
portions of the liver, adrenal glands, kidneys, spleen, pancreas,
stomach and bowel to be grossly unremarkable. No upper abdominal
adenopathy.

No worrisome lytic or sclerotic lesions. Degenerative changes are
seen in the spine.
IMPRESSION: 1. Lung-RADS Category 2, benign appearance or behavior. Continue
annual screening with low-dose chest CT without contrast in 12
months. Smoking cessation counseling is also recommended, if not
already performed.
2. Coronary artery calcification.

## 2015-06-16 ENCOUNTER — Ambulatory Visit (INDEPENDENT_AMBULATORY_CARE_PROVIDER_SITE_OTHER): Payer: Medicaid Other | Admitting: Obstetrics and Gynecology

## 2015-06-16 ENCOUNTER — Encounter: Payer: Self-pay | Admitting: Obstetrics and Gynecology

## 2015-06-16 VITALS — BP 117/79 | HR 90 | Resp 16 | Ht 68.0 in | Wt 141.8 lb

## 2015-06-16 DIAGNOSIS — N400 Enlarged prostate without lower urinary tract symptoms: Secondary | ICD-10-CM | POA: Diagnosis not present

## 2015-06-16 LAB — MICROSCOPIC EXAMINATION
Epithelial Cells (non renal): NONE SEEN /hpf (ref 0–10)
RBC, UA: NONE SEEN /hpf (ref 0–?)
Renal Epithel, UA: NONE SEEN /hpf

## 2015-06-16 LAB — URINALYSIS, COMPLETE
BILIRUBIN UA: NEGATIVE
GLUCOSE, UA: NEGATIVE
KETONES UA: NEGATIVE
Nitrite, UA: NEGATIVE
Protein, UA: NEGATIVE
RBC UA: NEGATIVE
Specific Gravity, UA: >1.03 — ABNORMAL HIGH (ref 1.005–1.030)
UUROB: 0.2 mg/dL (ref 0.2–1.0)
pH, UA: 5.5 (ref 5.0–7.5)

## 2015-06-16 LAB — BLADDER SCAN AMB NON-IMAGING: Scan Result: 12

## 2015-06-16 MED ORDER — SILDENAFIL CITRATE 100 MG PO TABS: 100.0000 mg | ORAL_TABLET | Freq: Every day | ORAL | Status: DC | PRN

## 2015-06-16 NOTE — Progress Notes (Signed)
06/16/2015 4:01 PM   Alex Bowers 12/30/1955 [id number]  Referring provider: [name], MD [address] [place], [place] [address]  Chief Complaint  Patient presents with  . Benign Prostatic Hypertrophy  . Erectile Dysfunction  . Establish Care    HPI: Patient is a 59yo male presenting today as a referral from his PCP with c/o ED and BPH with LUTS. Patient self reports that he has severe attention deficit disorder and it was difficult to obtain a detailed history of urologic issues during his visit.    1. BPH with LUTS Most bothersome urinary symptoms are frequency and urgency worsening over the last year.   I-PSS 20 QOL 4  2. Erectile Dysfunction- Patient reports inability to achieve erection over the last year. Denies penile pain or injury. May have been diagnosed with Peyronie's in the past. He states that he does not currently have a sexual partner but that he would like to have a  Medication available if needed.  SHIM 17  Current every day smoker x 30 years approximately 1ppd.  History of hypogonadism treated with androgel by PCP. Last available testosterone drawn by PCP was WNL at 512.   PMH: Past Medical History  Diagnosis Date  . Hyperlipidemia   . Stroke [facility]) 2014    Surgical History: Past Surgical History  Procedure Laterality Date  . Teeth removal     . Hernia repair Right 06/09/14    inguinal   . Colonoscopy  2015    Home Medications:    Medication List       This list is accurate as of: 06/16/15 11:59 PM.  Always use your most recent med list.               amphetamine-dextroamphetamine 10 MG 24 hr capsule  Commonly known as:  ADDERALL XR  Take 10 mg by mouth daily.     ANDROGEL PUMP 20.25 MG/ACT (1.62%) Gel  Generic drug:  Testosterone  APPLY 1 PUMP TO EACH UNDERARM EVERY MORNING     aspirin EC 81 MG tablet  Take 81 mg by mouth daily.     citalopram 20 MG tablet  Commonly known as:  CELEXA  Take 20 mg by mouth daily.     clopidogrel 75 MG tablet  Commonly known as:  PLAVIX  Take 75 mg by mouth daily.     FISH OIL + D3 PO  Take by mouth.     lisinopril 10 MG tablet  Commonly known as:  PRINIVIL,ZESTRIL  Take 10 mg by mouth daily.     sildenafil 100 MG tablet  Commonly known as:  VIAGRA  Take 1 tablet (100 mg total) by mouth daily as needed for erectile dysfunction.     simvastatin 40 MG tablet  Commonly known as:  ZOCOR  Take 40 mg by mouth daily.     vitamin C 1000 MG tablet  Take 1,000 mg by mouth 2 (two) times daily.        Allergies: No Known Allergies  Family History: Family History  Problem Relation Age of Onset  . Cancer - Other Brother     phageal  . Heart attack Father   . Colon polyps Mother     Social History:  reports that he has been smoking Cigarettes.  He has a 74 pack-year smoking history. He has never used smokeless tobacco. He reports that he does not use illicit drugs. His alcohol history is not on file.  ROS: UROLOGY Frequent Urination?:  Yes Hard to postpone urination?: No Burning/pain with urination?: No Get up at night to urinate?: Yes Leakage of urine?: No Urine stream starts and stops?: No Trouble starting stream?: Yes Do you have to strain to urinate?: No Blood in urine?: No Urinary tract infection?: No Sexually transmitted disease?: No Injury to kidneys or bladder?: No Painful intercourse?: No Weak stream?: Yes Erection problems?: Yes Penile pain?: No  Gastrointestinal Nausea?: No Vomiting?: No Indigestion/heartburn?: No Diarrhea?: No Constipation?: No  Constitutional Fever: No Night sweats?: No Weight loss?: No Fatigue?: No  Skin Skin rash/lesions?: No Itching?: No  Eyes Blurred vision?: No Double vision?: No  Ears/Nose/Throat Sore throat?: No Sinus problems?: No  Hematologic/Lymphatic Swollen glands?: No Easy bruising?: Yes  Cardiovascular Leg swelling?: No Chest pain?: No  Respiratory Cough?: Yes Shortness of  breath?: No  Endocrine Excessive thirst?: No  Musculoskeletal Back pain?: No Joint pain?: No  Neurological Headaches?: No Dizziness?: No  Psychologic Depression?: Yes Anxiety?: No  Physical Exam: BP 117/79 mmHg  Pulse 90  Resp 16  Ht 5\' 8"  (1.727 m)  Wt 141 lb 12.8 oz (64.32 kg)  BMI 21.57 kg/m2  Constitutional:  Alert and oriented, No acute distress. HEENT: [place] AT, moist mucus membranes.  Trachea midline, no masses. Cardiovascular: No clubbing, cyanosis, or edema. Respiratory: Normal respiratory effort, no increased work of breathing. GI: Abdomen is soft, nontender, nondistended, no abdominal masses GU: No CVA tenderness, circumcised phallus, subtle left sided calcification mid penile shaft, normal scrotum  DRE: +2 smooth prostate Skin: No rashes, bruises or suspicious lesions. Lymph: No cervical or inguinal adenopathy. Neurologic: Grossly intact, no focal deficits, moving all 4 extremities. Psychiatric: Normal mood and affect.  Laboratory Data:   Urinalysis    Component Value Date/Time   COLORURINE Yellow 07/08/2013 1744   APPEARANCEUR Clear 07/08/2013 1744   LABSPEC 1.014 07/08/2013 1744   PHURINE 6.0 07/08/2013 1744   GLUCOSEU Negative 06/16/2015 1422   GLUCOSEU Negative 07/08/2013 1744   HGBUR Negative 07/08/2013 1744   BILIRUBINUR Negative 06/16/2015 1422   BILIRUBINUR Negative 07/08/2013 1744   KETONESUR Negative 07/08/2013 1744   PROTEINUR Negative 07/08/2013 1744   NITRITE Negative 06/16/2015 1422   NITRITE Negative 07/08/2013 1744   LEUKOCYTESUR 1+* 06/16/2015 1422   LEUKOCYTESUR Negative 07/08/2013 1744    Pertinent Imaging:   Assessment & Plan:    1. BPH (benign prostatic hyperplasia)-  Patient was started on Flomax by his PCP.  Took his first pill today. Will f/u in 2 months and reassess symptoms. - Urinalysis, Complete - BLADDER SCAN AMB NON-IMAGING  2. Erectile Dysfunction- Patient provided samples of Viagra 100mg  and instructed to  take 1/2 tablet and increase to 1 tablet if needed.   Return in about 2 months (around 08/16/2015) for f/u BPH/ED.  These notes generated with voice recognition software. I apologize for typographical errors.  Herbert Moors, [facility] [address], [place], [place] [address] [phone]

## 2015-08-08 ENCOUNTER — Telehealth: Payer: Self-pay | Admitting: *Deleted

## 2015-08-08 NOTE — Telephone Encounter (Signed)
Left voicemail for patient notifyng them that it is time to schedule annual low dose lung cancer screening CT scan. Instructed patient to call back to verify information prior to the scan being scheduled.  

## 2015-08-18 ENCOUNTER — Encounter: Payer: Self-pay | Admitting: Obstetrics and Gynecology

## 2015-08-18 ENCOUNTER — Ambulatory Visit (INDEPENDENT_AMBULATORY_CARE_PROVIDER_SITE_OTHER): Payer: Medicaid Other | Admitting: Obstetrics and Gynecology

## 2015-08-18 VITALS — BP 134/85 | HR 74 | Resp 16 | Ht 68.0 in | Wt 145.7 lb

## 2015-08-18 DIAGNOSIS — N529 Male erectile dysfunction, unspecified: Secondary | ICD-10-CM | POA: Diagnosis not present

## 2015-08-18 DIAGNOSIS — N400 Enlarged prostate without lower urinary tract symptoms: Secondary | ICD-10-CM

## 2015-08-18 DIAGNOSIS — R3129 Other microscopic hematuria: Secondary | ICD-10-CM

## 2015-08-18 LAB — MICROSCOPIC EXAMINATION: Epithelial Cells (non renal): NONE SEEN /hpf (ref 0–10)

## 2015-08-18 LAB — URINALYSIS, COMPLETE
Bilirubin, UA: NEGATIVE
Glucose, UA: NEGATIVE
Ketones, UA: NEGATIVE
LEUKOCYTES UA: NEGATIVE
Nitrite, UA: NEGATIVE
PH UA: 5 (ref 5.0–7.5)
RBC, UA: NEGATIVE
Specific Gravity, UA: >1.03 — ABNORMAL HIGH (ref 1.005–1.030)
Urobilinogen, Ur: 0.2 mg/dL (ref 0.2–1.0)

## 2015-08-18 NOTE — Progress Notes (Signed)
4:45 PM   Alex Bowers February 06, 1956 [id number]  Referring provider: [name], MD [address] [place], [place] [address]  Chief Complaint  Patient presents with  . Benign Prostatic Hypertrophy  . Erectile Dysfunction    HPI: Patient is a 59yo male presenting today as a referral from his PCP with c/o ED and BPH with LUTS. Patient self reports that he has severe attention deficit disorder and it was difficult to obtain a detailed history of urologic issues during his visit.    1. BPH with LUTS- Most bothersome urinary symptoms are frequency and urgency worsening over the last year.   I-PSS 20 QOL 4  2. Erectile Dysfunction- Patient reports inability to achieve erection over the last year. Denies penile pain or injury. May have been diagnosed with Peyronie's in the past. He states that he does not currently have a sexual partner but that he would like to have a  Medication available if needed.  SHIM 17  Current every day smoker x 30 years approximately 1ppd.  History of hypogonadism treated with androgel by PCP. Last available testosterone drawn by PCP was WNL at 512.   Current Status: I-PSS 12 QOL-mostly dissatisfied  Continuing to smoke heavily. States he cannot "get it in his mind to quit". He states that he tried a whole pill of Viagra and experienced sweating and dizziness. He is unsure if it improved his ability to achieve an erection because he felt so poorly. He was instructed to take only half a pill at his last visit but he states that he misunderstood directions. He states that his primary care provider discontinued his Flomax because he was experiencing leg cramping and was thought it was a side effect of his medication. He reports that his creatinine has improved since discontinuing the Flomax. He he has not noticed significant worsening of his urinary symptoms since discontinuing his Flomax.  His IP SS score has improved since his last visit.  PMH: Past  Medical History  Diagnosis Date  . Hyperlipidemia   . Stroke [facility]) 2014    Surgical History: Past Surgical History  Procedure Laterality Date  . Teeth removal     . Hernia repair Right 06/09/14    inguinal   . Colonoscopy  2015    Home Medications:    Medication List       This list is accurate as of: 08/18/15  4:45 PM.  Always use your most recent med list.               amphetamine-dextroamphetamine 10 MG 24 hr capsule  Commonly known as:  ADDERALL XR  Take 10 mg by mouth daily.     ANDROGEL PUMP 20.25 MG/ACT (1.62%) Gel  Generic drug:  Testosterone  APPLY 1 PUMP TO EACH UNDERARM EVERY MORNING     aspirin EC 81 MG tablet  Take 81 mg by mouth daily.     citalopram 20 MG tablet  Commonly known as:  CELEXA  Take 20 mg by mouth daily.     clopidogrel 75 MG tablet  Commonly known as:  PLAVIX  Take 75 mg by mouth daily.     FISH OIL + D3 PO  Take by mouth.     lisinopril 10 MG tablet  Commonly known as:  PRINIVIL,ZESTRIL  Take 10 mg by mouth daily.     sildenafil 100 MG tablet  Commonly known as:  VIAGRA  Take 1 tablet (100 mg total) by mouth daily as needed for  erectile dysfunction.     simvastatin 40 MG tablet  Commonly known as:  ZOCOR  Take 40 mg by mouth daily.     tamsulosin 0.4 MG Caps capsule  Commonly known as:  FLOMAX  Take 0.4 mg by mouth daily. Reported on 08/18/2015     vitamin C 1000 MG tablet  Take 1,000 mg by mouth 2 (two) times daily.        Allergies: No Known Allergies  Family History: Family History  Problem Relation Age of Onset  . Cancer - Other Brother     phageal  . Heart attack Father   . Colon polyps Mother     Social History:  reports that he has been smoking Cigarettes.  He has a 74 pack-year smoking history. He has never used smokeless tobacco. He reports that he does not use illicit drugs. His alcohol history is not on file.  ROS: UROLOGY Frequent Urination?: Yes Hard to postpone urination?:  No Burning/pain with urination?: No Get up at night to urinate?: No Leakage of urine?: No Urine stream starts and stops?: No Trouble starting stream?: No Do you have to strain to urinate?: No Blood in urine?: No Urinary tract infection?: No Sexually transmitted disease?: No Injury to kidneys or bladder?: No Painful intercourse?: No Weak stream?: No Erection problems?: Yes Penile pain?: No  Gastrointestinal Nausea?: No Vomiting?: No Indigestion/heartburn?: No Diarrhea?: No Constipation?: No  Constitutional Fever: No Night sweats?: No Weight loss?: No Fatigue?: No  Skin Skin rash/lesions?: No Itching?: No  Eyes Blurred vision?: No Double vision?: No  Ears/Nose/Throat Sore throat?: No Sinus problems?: No  Hematologic/Lymphatic Swollen glands?: No Easy bruising?: No  Cardiovascular Leg swelling?: No Chest pain?: No  Respiratory Cough?: Yes Shortness of breath?: No  Endocrine Excessive thirst?: No  Musculoskeletal Back pain?: No Joint pain?: No  Neurological Headaches?: No Dizziness?: No  Psychologic Depression?: Yes Anxiety?: No  Physical Exam: BP 134/85 mmHg  Pulse 74  Resp 16  Ht 5\' 8"  (1.727 m)  Wt 145 lb 11.2 oz (66.089 kg)  BMI 22.16 kg/m2  Constitutional:  Alert and oriented, No acute distress. HEENT: [place] AT, moist mucus membranes.  Trachea midline, no masses. Cardiovascular: No clubbing, cyanosis, or edema. Respiratory: Normal respiratory effort, no increased work of breathing. GI: Abdomen is soft, nontender, nondistended, no abdominal masses GU: No CVA tenderness, circumcised phallus, subtle left sided calcification mid penile shaft, normal scrotum  DRE: +2 smooth prostate Skin: No rashes, bruises or suspicious lesions. Lymph: No cervical or inguinal adenopathy. Neurologic: Grossly intact, no focal deficits, moving all 4 extremities. Psychiatric: Normal mood and affect.  Laboratory Data:   Urinalysis Results for orders  placed or performed in visit on 08/18/15  Microscopic Examination  Result Value Ref Range   WBC, UA >30 (H) 0 -  5 /hpf   RBC, UA 3-10 (A) 0 -  2 /hpf   Epithelial Cells (non renal) None seen 0 - 10 /hpf   Mucus, UA Present (A) Not Estab.   Bacteria, UA Many (A) None seen/Few  Urinalysis, Complete  Result Value Ref Range   Specific Gravity, UA >1.030 (H) 1.005 - 1.030   pH, UA 5.0 5.0 - 7.5   Color, UA Yellow Yellow   Appearance Ur Cloudy (A) Clear   Leukocytes, UA Negative Negative   Protein, UA 2+ (A) Negative/Trace   Glucose, UA Negative Negative   Ketones, UA Negative Negative   RBC, UA Negative Negative   Bilirubin, UA Negative Negative  Urobilinogen, Ur 0.2 0.2 - 1.0 mg/dL   Nitrite, UA Negative Negative   Microscopic Examination See below:     Pertinent Imaging:   Assessment & Plan:    1. BPH (benign prostatic hyperplasia)-   Patient states that his PCP discontinued Flomax d/t muscles cramps. Symptoms have improved except on roads trips. He declined further medication at this time. - Urinalysis, Complete - BLADDER SCAN AMB NON-IMAGING  2. Erectile Dysfunction-  Patient stated that he took a whole pill and experienced sweating and dizziness.  Instructed him to try and take 1/2 pill and see if side effects improve. Discussed smoking cessation and smoking's association with worsening erectile dysfunction.  Patient states he is not ready to quick at this time.  3. Microscopic Hematuria- 3-10 RBCs seen today. We discussed the differential diagnosis for microscopic hematuria including nephrolithiasis, renal or upper tract tumors, bladder stones, UTIs, or bladder tumors as well as undetermined etiologies. Per AUA guidelines, I did recommend complete microscopic hematuria evaluation including CTU, possible urine cytology, and office cystoscopy.  Return for CT Urogram results and cystoscopy.  These notes generated with voice recognition software. I apologize for  typographical errors.  Herbert Moors, [facility] [address] [place], [place] [address] [phone]

## 2015-08-18 NOTE — Patient Instructions (Signed)
Cystoscopy Cystoscopy is a procedure that is used to help your caregiver diagnose and sometimes treat conditions that affect your lower urinary tract. Your lower urinary tract includes your bladder and the tube through which urine passes from your bladder out of your body (urethra). Cystoscopy is performed with a thin, tube-shaped instrument (cystoscope). The cystoscope has lenses and a light at the end so that your caregiver can see inside your bladder. The cystoscope is inserted at the entrance of your urethra. Your caregiver guides it through your urethra and into your bladder. There are two main types of cystoscopy:  Flexible cystoscopy (with a flexible cystoscope).  Rigid cystoscopy (with a rigid cystoscope). Cystoscopy may be recommended for many conditions, including:  Urinary tract infections.  Blood in your urine (hematuria).  Loss of bladder control (urinary incontinence) or overactive bladder.  Unusual cells found in a urine sample.  Urinary blockage.  Painful urination. Cystoscopy may also be done to remove a sample of your tissue to be checked under a microscope (biopsy). It may also be done to remove or destroy bladder stones. LET YOUR CAREGIVER KNOW ABOUT:  Allergies to food or medicine.  Medicines taken, including vitamins, herbs, eyedrops, over-the-counter medicines, and creams.  Use of steroids (by mouth or creams).  Previous problems with anesthetics or numbing medicines.  History of bleeding problems or blood clots.  Previous surgery.  Other health problems, including diabetes and kidney problems.  Possibility of pregnancy, if this applies. PROCEDURE The area around the opening to your urethra will be cleaned. A medicine to numb your urethra (local anesthetic) is used. If a tissue sample or stone is removed during the procedure, you may be given a medicine to make you sleep (general anesthetic). Your caregiver will gently insert the tip of the cystoscope  into your urethra. The cystoscope will be slowly glided through your urethra and into your bladder. Sterile fluid will flow through the cystoscope and into your bladder. The fluid will expand and stretch your bladder. This gives your caregiver a better view of your bladder walls. The procedure lasts about 15-20 minutes. AFTER THE PROCEDURE If a local anesthetic is used, you will be allowed to go home as soon as you are ready. If a general anesthetic is used, you will be taken to a recovery area until you are stable. You may have temporary bleeding and burning on urination.   This information is not intended to replace advice given to you by your health care provider. Make sure you discuss any questions you have with your health care provider.   Document Released: 08/03/2000 Document Revised: 08/27/2014 Document Reviewed: 01/28/2012 Elsevier Interactive Patient Education 2016 Elsevier Inc. Hematuria, Adult Hematuria is blood in your urine. It can be caused by a bladder infection, kidney infection, prostate infection, kidney stone, or cancer of your urinary tract. Infections can usually be treated with medicine, and a kidney stone usually will pass through your urine. If neither of these is the cause of your hematuria, further workup to find out the reason may be needed. It is very important that you tell your health care provider about any blood you see in your urine, even if the blood stops without treatment or happens without causing pain. Blood in your urine that happens and then stops and then happens again can be a symptom of a very serious condition. Also, pain is not a symptom in the initial stages of many urinary cancers. HOME CARE INSTRUCTIONS   Drink lots of fluid, 3-4  quarts a day. If you have been diagnosed with an infection, cranberry juice is especially recommended, in addition to large amounts of water.  Avoid caffeine, tea, and carbonated beverages because they tend to irritate the  bladder.  Avoid alcohol because it may irritate the prostate.  Take all medicines as directed by your health care provider.  If you were prescribed an antibiotic medicine, finish it all even if you start to feel better.  If you have been diagnosed with a kidney stone, follow your health care provider's instructions regarding straining your urine to catch the stone.  Empty your bladder often. Avoid holding urine for long periods of time.  After a bowel movement, women should cleanse front to back. Use each tissue only once.  Empty your bladder before and after sexual intercourse if you are a male. SEEK MEDICAL CARE IF:  You develop back pain.  You have a fever.  You have a feeling of sickness in your stomach (nausea) or vomiting.  Your symptoms are not better in 3 days. Return sooner if you are getting worse. SEEK IMMEDIATE MEDICAL CARE IF:   You develop severe vomiting and are unable to keep the medicine down.  You develop severe back or abdominal pain despite taking your medicines.  You begin passing a large amount of blood or clots in your urine.  You feel extremely weak or faint, or you pass out. MAKE SURE YOU:   Understand these instructions.  Will watch your condition.  Will get help right away if you are not doing well or get worse.   This information is not intended to replace advice given to you by your health care provider. Make sure you discuss any questions you have with your health care provider.   Document Released: 08/06/2005 Document Revised: 08/27/2014 Document Reviewed: 04/06/2013 Elsevier Interactive Patient Education [employer].

## 2015-08-31 ENCOUNTER — Ambulatory Visit
Admission: RE | Admit: 2015-08-31 | Discharge: 2015-08-31 | Disposition: A | Discharge status: Home / Self Care | Payer: Medicaid Other | Source: Ambulatory Visit | Attending: Obstetrics and Gynecology | Admitting: Obstetrics and Gynecology

## 2015-08-31 DIAGNOSIS — N400 Enlarged prostate without lower urinary tract symptoms: Secondary | ICD-10-CM | POA: Diagnosis present

## 2015-08-31 DIAGNOSIS — R3129 Other microscopic hematuria: Secondary | ICD-10-CM | POA: Diagnosis present

## 2015-08-31 HISTORY — DX: Essential (primary) hypertension: I10

## 2015-08-31 MED ORDER — IOHEXOL 300 MG/ML  SOLN
125.0000 mL | Freq: Once | INTRAMUSCULAR | Status: AC | PRN
  Administered 2015-08-31: 125 mL via INTRAVENOUS

## 2015-09-05 ENCOUNTER — Ambulatory Visit (INDEPENDENT_AMBULATORY_CARE_PROVIDER_SITE_OTHER): Payer: Medicaid Other | Admitting: Urology

## 2015-09-05 VITALS — BP 148/78 | HR 74 | Ht 68.0 in | Wt 145.7 lb

## 2015-09-05 DIAGNOSIS — R350 Frequency of micturition: Secondary | ICD-10-CM

## 2015-09-05 DIAGNOSIS — N5201 Erectile dysfunction due to arterial insufficiency: Secondary | ICD-10-CM

## 2015-09-05 DIAGNOSIS — R3129 Other microscopic hematuria: Secondary | ICD-10-CM

## 2015-09-05 DIAGNOSIS — E291 Testicular hypofunction: Secondary | ICD-10-CM | POA: Diagnosis not present

## 2015-09-05 DIAGNOSIS — N529 Male erectile dysfunction, unspecified: Secondary | ICD-10-CM | POA: Insufficient documentation

## 2015-09-05 LAB — MICROSCOPIC EXAMINATION
EPITHELIAL CELLS (NON RENAL): NONE SEEN /HPF (ref 0–10)
RBC, UA: NONE SEEN /hpf (ref 0–?)

## 2015-09-05 LAB — URINALYSIS, COMPLETE
Bilirubin, UA: NEGATIVE
Glucose, UA: NEGATIVE
NITRITE UA: NEGATIVE
PH UA: 5 (ref 5.0–7.5)
PROTEIN UA: NEGATIVE
RBC, UA: NEGATIVE
Specific Gravity, UA: 1.02 (ref 1.005–1.030)
UUROB: 0.2 mg/dL (ref 0.2–1.0)

## 2015-09-05 MED ORDER — LIDOCAINE HCL 2 % EX GEL
1.0000 "application " | Freq: Once | CUTANEOUS | Status: AC
  Administered 2015-09-05: 1 via URETHRAL

## 2015-09-05 MED ORDER — CIPROFLOXACIN HCL 500 MG PO TABS
500.0000 mg | ORAL_TABLET | Freq: Once | ORAL | Status: AC
  Administered 2015-09-05: 500 mg via ORAL

## 2015-09-05 NOTE — Progress Notes (Signed)
09/05/2015 3:27 PM   Alex Bowers Jun 08, 1956 [id number]  Referring provider: [name], MD [address] [place], [place] [address]  Chief Complaint  Patient presents with  . Cysto    HPI:  1 - Lower Urinary Tract Symptoms / Increased Urinary Freqeuncy - long h/o modest bother from mix of obsructive and irritative symtptoms.Prostate vol 58mL by imaging calculation. He is on testosterone and adderal (alpha agonist amphetamine). Cysto 1/20117 with impressive intraluminal bilobar hypertrophy.   2 - Erectile Dysfunction - on Sildenefil 50mg  for inability to maintain erection. Libido preserved.  3 - Microscopic Hematuria - blood on UA noted by PCP x several. 70PY smoker. Cysto 08/2015 with large friable prostate likely source.  4 - Hypogonadism - on Androgel per PCP for indicaiton of depression and low libido. Had obstructinve urinary symptoms as per above. No record of baseline testing or current values. He states compliant with Q7mo labs / PSA.   Today "Alex Bowers" is seen in f/u above and cysto.    PMH: Past Medical History  Diagnosis Date  . Hyperlipidemia   . Stroke ([facility]) 2014  . Hypertension     Surgical History: Past Surgical History  Procedure Laterality Date  . Teeth removal     . Hernia repair Right 06/09/14    inguinal   . Colonoscopy  2015    Home Medications:    Medication List       This list is accurate as of: 09/05/15  3:27 PM.  Always use your most recent med list.               amphetamine-dextroamphetamine 10 MG 24 hr capsule  Commonly known as:  ADDERALL XR  Take 10 mg by mouth daily.     ANDROGEL PUMP 20.25 MG/ACT (1.62%) Gel  Generic drug:  Testosterone  Reported on 09/05/2015     aspirin EC 81 MG tablet  Take 81 mg by mouth daily.     citalopram 20 MG tablet  Commonly known as:  CELEXA  Take 20 mg by mouth daily.     FISH OIL + D3 PO  Take by mouth.     lisinopril 10 MG tablet  Commonly known as:  PRINIVIL,ZESTRIL    Take 10 mg by mouth daily.     sildenafil 100 MG tablet  Commonly known as:  VIAGRA  Take 1 tablet (100 mg total) by mouth daily as needed for erectile dysfunction.     simvastatin 40 MG tablet  Commonly known as:  ZOCOR  Take 40 mg by mouth daily.     vitamin C 1000 MG tablet  Take 1,000 mg by mouth 2 (two) times daily.        Allergies: No Known Allergies  Family History: Family History  Problem Relation Age of Onset  . Cancer - Other Brother     phageal  . Heart attack Father   . Colon polyps Mother     Social History:  reports that he has been smoking Cigarettes.  He has a 74 pack-year smoking history. He has never used smokeless tobacco. He reports that he does not use illicit drugs. His alcohol history is not on file.  ROS:     Review of Systems  Gastrointestinal (upper)  : Negative for upper GI symptoms  Gastrointestinal (lower) : Negative for lower GI symptoms  Constitutional : Negative for symptoms  Skin: Negative for skin symptoms  Eyes: Negative for eye symptoms  Ear/Nose/Throat : Negative for Ear/Nose/Throat  symptoms  Hematologic/Lymphatic: Negative for Hematologic/Lymphatic symptoms  Cardiovascular : Negative for cardiovascular symptoms  Respiratory : Negative for respiratory symptoms  Endocrine: Negative for endocrine symptoms  Musculoskeletal: Negative for musculoskeletal symptoms  Neurological: Negative for neurological symptoms  Psychologic: Negative for psychiatric symptoms  Physical Exam: BP 148/78 mmHg  Pulse 74  Ht 5\' 8"  (1.727 m)  Wt 145 lb 11.2 oz (66.089 kg)  BMI 22.16 kg/m2  Constitutional:  Alert and oriented, No acute distress. HEENT: [place] AT, moist mucus membranes.  Trachea midline, no masses. Cardiovascular: No clubbing, cyanosis, or edema. Respiratory: Normal respiratory effort, no increased work of breathing. GI: Abdomen is soft, nontender, nondistended, no abdominal masses GU: No CVA  tenderness.phallus sraight, circ'd. No testes masses.  Skin: No rashes, bruises or suspicious lesions. Lymph: No cervical or inguinal adenopathy. Neurologic: Grossly intact, no focal deficits, moving all 4 extremities. Psychiatric: Normal mood and affect.  Laboratory Data: Lab Results  Component Value Date   WBC 12.6* 07/19/2014   HGB 16.4 07/19/2014   HCT 49.1 07/19/2014   MCV 99 07/19/2014   PLT 210 07/19/2014    Lab Results  Component Value Date   CREATININE 0.78 07/08/2013    No results found for: PSA  No results found for: TESTOSTERONE  No results found for: HGBA1C  Urinalysis    Component Value Date/Time   COLORURINE Yellow 07/08/2013 1744   APPEARANCEUR Clear 07/08/2013 1744   LABSPEC 1.014 07/08/2013 1744   PHURINE 6.0 07/08/2013 1744   GLUCOSEU Negative 08/18/2015 1442   GLUCOSEU Negative 07/08/2013 1744   HGBUR Negative 07/08/2013 1744   BILIRUBINUR Negative 08/18/2015 1442   BILIRUBINUR Negative 07/08/2013 1744   KETONESUR Negative 07/08/2013 1744   PROTEINUR Negative 07/08/2013 1744   NITRITE Negative 08/18/2015 1442   NITRITE Negative 07/08/2013 1744   LEUKOCYTESUR Negative 08/18/2015 1442   LEUKOCYTESUR Negative 07/08/2013 1744    Pertinent Imaging: As per HPI      Cystoscopy Procedure Note  Patient identification was confirmed, informed consent was obtained, and patient was prepped using Betadine solution.  Lidocaine jelly was administered per urethral meatus.    Preoperative abx where received prior to procedure.     Pre-Procedure: - Inspection reveals a normal caliber ureteral meatus.  Procedure: The flexible cystoscope was introduced without difficulty - No urethral strictures/lesions are present. - Enlarged prostate that is moderately friable (bilobar) - Normal bladder neck - Bilateral ureteral orifices identified - Bladder mucosa  reveals no ulcers, tumors, or lesions - No bladder stones - No trabeculation  Retroflexion  shows intraluminal prostatic hypertrophy (not frank median lobe)   Post-Procedure: - Patient tolerated the procedure well   Assessment & Plan:    1 - Lower Urinary Tract Symptoms / Increased Urinary Freqeuncy - Likely from BPH exacerbated by exogenous testosterone and alpha agonists (adderal). He is in process of titrating those back and does not desire additional therapy at this time. Should this change, would consider finasteride given gland size and cysto appearance.   2 - Erectile Dysfunction - likely vascular disease made worse by continued smoking, this was expressed frankly to pt.   3 - Microscopic Hematuria - likley from BPH as per above. Discussed role of finasteride to help with this   4 - Hypogonadism - re-iterated pr VERY risky candidate for therapy for this given smoking, stigmata of vascular disease, and urinary obstruction, Rec'd titrate to off or lowest possible dose and he is in process of this. Also reiterated importanct of Q14mo Hgb, PSA,  T levels.  5 - RTC 1 year for UA / provider visit and then prn if stable.      No Follow-up on file.  Alexis Frock, [facility] [address] [place], [place] [address] [phone]

## 2015-12-12 ENCOUNTER — Telehealth: Payer: Self-pay | Admitting: *Deleted

## 2015-12-12 NOTE — Telephone Encounter (Signed)
Left voicemail for patient notifying them that it is time for recommended follow up lung cancer screening CT scan. Requested patient call back to verify information prior to scan being scheduled.

## 2015-12-19 ENCOUNTER — Telehealth: Payer: Self-pay | Admitting: *Deleted

## 2015-12-19 NOTE — Telephone Encounter (Signed)
Patient returned call stating he wants to have his 1 year follow up  lung cancer screening low dose CT scan is due. Confirmed that patient is within age range of 55-77, asymptomatic of lung cancer, and no other serious disease processes that would make treatment of lung cancer not possible. The patient is a  current/former smokerwith a 46 pack year history. The shared decision making visit was completed 06/23/2104. The patient is agreeable for CT scan to be scheduled.

## 2015-12-29 ENCOUNTER — Other Ambulatory Visit: Payer: Self-pay | Admitting: Family Medicine

## 2015-12-29 ENCOUNTER — Encounter: Payer: Self-pay | Admitting: Family Medicine

## 2015-12-29 DIAGNOSIS — Z87891 Personal history of nicotine dependence: Secondary | ICD-10-CM | POA: Insufficient documentation

## 2015-12-29 HISTORY — DX: Personal history of nicotine dependence: Z87.891

## 2016-01-05 ENCOUNTER — Ambulatory Visit: Admission: RE | Admit: 2016-01-05 | Payer: Medicaid Other | Source: Ambulatory Visit

## 2016-03-27 ENCOUNTER — Other Ambulatory Visit: Payer: Self-pay

## 2016-06-08 ENCOUNTER — Telehealth: Payer: Self-pay | Admitting: *Deleted

## 2016-06-08 NOTE — Telephone Encounter (Signed)
Patient has been due for CT lung cancer screening annual follow up. Despite multiple attempts at all contact numbers available, as well as no show appointment, have not been able to arrange for CT scan. Letter mailed to patient in final attempt to contact patient. I will be happy to assist in the future if patient so desires. Will forward to referring provider/ PCP.

## 2016-09-05 ENCOUNTER — Ambulatory Visit: Payer: Medicaid Other | Admitting: Urology

## 2016-09-12 ENCOUNTER — Ambulatory Visit (INDEPENDENT_AMBULATORY_CARE_PROVIDER_SITE_OTHER): Payer: Medicaid Other | Admitting: Urology

## 2016-09-12 ENCOUNTER — Encounter: Payer: Self-pay | Admitting: Urology

## 2016-09-12 VITALS — BP 135/80 | HR 71 | Ht 68.0 in | Wt 156.9 lb

## 2016-09-12 DIAGNOSIS — N529 Male erectile dysfunction, unspecified: Secondary | ICD-10-CM

## 2016-09-12 DIAGNOSIS — N401 Enlarged prostate with lower urinary tract symptoms: Secondary | ICD-10-CM | POA: Diagnosis not present

## 2016-09-12 DIAGNOSIS — R3129 Other microscopic hematuria: Secondary | ICD-10-CM | POA: Diagnosis not present

## 2016-09-12 DIAGNOSIS — N138 Other obstructive and reflux uropathy: Secondary | ICD-10-CM | POA: Diagnosis not present

## 2016-09-12 MED ORDER — FINASTERIDE 5 MG PO TABS: 5.0000 mg | ORAL_TABLET | Freq: Every day | ORAL | 3 refills | Status: DC

## 2016-09-12 NOTE — Progress Notes (Signed)
09/12/2016 4:21 PM   Alex Bowers 1956-03-30 [id number]  Referring provider: [name], MD [address] [place], [place] [address]  Chief Complaint  Patient presents with  . Benign Prostatic Hypertrophy    HPI: Patient is a 61 year old Caucasian male who presents today for a recheck for microscopic hematuria.    Patient states that he has not seen any gross hematuria. He has seen blood in his semen.  Today his UA is positive for 3-10 rbc's per high-power field.  He completed a hematuria workup in 2017 and was found to have impressive intraluminal bilobar hypertrophy.   He is no longer on testosterone therapy as he found no improvement in his ED on this therapy.    He is also experiencing an increase in his nocturia and daytime urgency and frequency.  He has not had recent fevers, chills, nausea or vomiting.  His PVR today is 12 mL.     PMH: Past Medical History:  Diagnosis Date  . Hyperlipidemia   . Hypertension   . Personal history of tobacco use, presenting hazards to health 12/29/2015  . Stroke [facility]) 2014    Surgical History: Past Surgical History:  Procedure Laterality Date  . COLONOSCOPY  2015  . HERNIA REPAIR Right 06/09/14   inguinal   . teeth removal       Home Medications:  Allergies as of 09/12/2016   No Known Allergies     Medication List       Accurate as of 09/12/16  4:21 PM. Always use your most recent med list.          amphetamine-dextroamphetamine 10 MG 24 hr capsule Commonly known as:  ADDERALL XR Take 10 mg by mouth daily.   ANDROGEL PUMP 20.25 MG/ACT (1.62%) Gel Generic drug:  Testosterone Reported on 09/05/2015   aspirin EC 81 MG tablet Take 81 mg by mouth daily.   citalopram 20 MG tablet Commonly known as:  CELEXA Take 20 mg by mouth daily.   finasteride 5 MG tablet Commonly known as:  PROSCAR Take 1 tablet (5 mg total) by mouth daily.   FISH OIL + D3 PO Take by mouth.   lisinopril 10 MG tablet Commonly  known as:  PRINIVIL,ZESTRIL Take 10 mg by mouth daily.   sildenafil 100 MG tablet Commonly known as:  VIAGRA Take 1 tablet (100 mg total) by mouth daily as needed for erectile dysfunction.   simvastatin 40 MG tablet Commonly known as:  ZOCOR Take 40 mg by mouth daily.   vitamin C 1000 MG tablet Take 1,000 mg by mouth 2 (two) times daily.       Allergies: No Known Allergies  Family History: Family History  Problem Relation Age of Onset  . Cancer - Other Brother     phageal  . Heart attack Father   . Colon polyps Mother     Social History:  reports that he has been smoking Cigarettes.  He has a 74.00 pack-year smoking history. He has never used smokeless tobacco. He reports that he does not use drugs. His alcohol history is not on file.  ROS: UROLOGY Frequent Urination?: No Hard to postpone urination?: No Burning/pain with urination?: No Get up at night to urinate?: Yes Leakage of urine?: No Urine stream starts and stops?: No Trouble starting stream?: No Do you have to strain to urinate?: No Blood in urine?: No Urinary tract infection?: No Sexually transmitted disease?: No Injury to kidneys or bladder?: No Painful  intercourse?: No Weak stream?: No Erection problems?: No Penile pain?: No  Gastrointestinal Nausea?: No Vomiting?: No Indigestion/heartburn?: No Diarrhea?: No Constipation?: No  Constitutional Fever: No Night sweats?: No Weight loss?: No Fatigue?: No  Skin Skin rash/lesions?: No Itching?: No  Eyes Blurred vision?: No Double vision?: No  Ears/Nose/Throat Sore throat?: No Sinus problems?: No  Hematologic/Lymphatic Swollen glands?: No Easy bruising?: No  Cardiovascular Leg swelling?: No Chest pain?: No  Respiratory Cough?: No Shortness of breath?: No  Endocrine Excessive thirst?: No  Musculoskeletal Back pain?: No Joint pain?: No  Neurological Headaches?: No Dizziness?: No  Psychologic Depression?: No Anxiety?:  No  Physical Exam: BP 135/80 (BP Location: Left Arm, Patient Position: Sitting, Cuff Size: Normal)   Pulse 71   Ht 5\' 8"  (1.727 m)   Wt 156 lb 14.4 oz (71.2 kg)   BMI 23.86 kg/m   Constitutional: Well nourished. Alert and oriented, No acute distress. HEENT: [place] AT, moist mucus membranes. Trachea midline, no masses. Cardiovascular: No clubbing, cyanosis, or edema. Respiratory: Normal respiratory effort, no increased work of breathing. GI: Abdomen is soft, non tender, non distended, no abdominal masses. Liver and spleen not palpable.  No hernias appreciated.  Stool sample for occult testing is not indicated.   GU: No CVA tenderness.  No bladder fullness or masses.  Patient with circumcised phallus.  Urethral meatus is patent.  No penile discharge. No penile lesions or rashes. Scrotum without lesions, cysts, rashes and/or edema.  Testicles are located scrotally bilaterally. No masses are appreciated in the testicles. Left and right epididymis are normal.  Peyronies's plaque Rectal: Patient with  normal sphincter tone. Anus and perineum without scarring or rashes. No rectal masses are appreciated. Prostate is approximately 60 grams, no nodules are appreciated. Seminal vesicles are normal. Skin: No rashes, bruises or suspicious lesions. Lymph: No cervical or inguinal adenopathy. Neurologic: Grossly intact, no focal deficits, moving all 4 extremities. Psychiatric: Normal mood and affect.  Laboratory Data: PSA History  1.0 ng/mL on 06/07/2015 Lab Results  Component Value Date   WBC 12.6 (H) 07/19/2014   HGB 16.4 07/19/2014   HCT 49.1 07/19/2014   MCV 99 07/19/2014   PLT 210 07/19/2014    Lab Results  Component Value Date   CREATININE 0.78 07/08/2013       Component Value Date/Time   CHOL 233 (H) 07/09/2013 0407   HDL 35 (L) 07/09/2013 0407   VLDL 19 07/09/2013 0407   LDLCALC 179 (H) 07/09/2013 0407    Lab Results  Component Value Date   AST 24 07/08/2013   Lab Results   Component Value Date   ALT 23 07/08/2013    Urinalysis 6-10 WBCs. 3-10 rbc's.  See EPIC.  Pertinent Imaging: Results for Alex Bowers, Alex Bowers (MRN [id number]) as of 09/16/2016 20:57  Ref. Range 06/16/2015 14:27  Scan Result Unknown 12 mL    Assessment & Plan:    1. Microscopic hematuria  - completed hematuria workup 08/2015 - likely from BPH as he is having hematospermia  - recommended starting finasteride at this time, script send to pharmacy  - Urinalysis, Complete  - RTC in one month to recheck UA  2. BPH with LUTS  - Continue conservative management, avoiding bladder irritants and timed voiding's  - Initiate 5 alpha reductase inhibitor (finasteride), discussed side effects  - RTC in one month for IPS'S and PSA  3. Erectile dysfunction  - found no benefit with testosterone therapy  - will retry the Viagra, taking the medication two  hours prior to intercourse on an empty stomach  - RTC in one month with SHIM  Return in about 1 month (around 10/13/2016) for  IPSS, SHIM and exam.  These notes generated with voice recognition software. I apologize for typographical errors.  Zara Council, [facility] [address], [place], [place] [address] [phone]

## 2016-09-13 ENCOUNTER — Other Ambulatory Visit: Payer: Self-pay

## 2016-09-13 LAB — URINALYSIS, COMPLETE
Bilirubin, UA: NEGATIVE
Glucose, UA: NEGATIVE
Ketones, UA: NEGATIVE
Nitrite, UA: NEGATIVE
PH UA: 7.5 (ref 5.0–7.5)
PROTEIN UA: NEGATIVE
RBC, UA: NEGATIVE
Specific Gravity, UA: 1.015 (ref 1.005–1.030)
UUROB: 0.2 mg/dL (ref 0.2–1.0)

## 2016-09-13 LAB — MICROSCOPIC EXAMINATION

## 2016-10-11 ENCOUNTER — Encounter: Payer: Self-pay | Admitting: Urology

## 2016-10-11 ENCOUNTER — Ambulatory Visit (INDEPENDENT_AMBULATORY_CARE_PROVIDER_SITE_OTHER): Payer: Medicaid Other | Admitting: Urology

## 2016-10-11 VITALS — BP 125/73 | HR 84 | Ht 68.0 in | Wt 155.2 lb

## 2016-10-11 DIAGNOSIS — R3915 Urgency of urination: Secondary | ICD-10-CM

## 2016-10-11 DIAGNOSIS — N138 Other obstructive and reflux uropathy: Secondary | ICD-10-CM

## 2016-10-11 DIAGNOSIS — N529 Male erectile dysfunction, unspecified: Secondary | ICD-10-CM | POA: Diagnosis not present

## 2016-10-11 DIAGNOSIS — N401 Enlarged prostate with lower urinary tract symptoms: Secondary | ICD-10-CM

## 2016-10-11 DIAGNOSIS — R3129 Other microscopic hematuria: Secondary | ICD-10-CM | POA: Diagnosis not present

## 2016-10-11 LAB — URINALYSIS, COMPLETE
Bilirubin, UA: NEGATIVE
GLUCOSE, UA: NEGATIVE
KETONES UA: NEGATIVE
Leukocytes, UA: NEGATIVE
NITRITE UA: NEGATIVE
Protein, UA: NEGATIVE
RBC, UA: NEGATIVE
SPEC GRAV UA: 1.02 (ref 1.005–1.030)
UUROB: 0.2 mg/dL (ref 0.2–1.0)
pH, UA: 5.5 (ref 5.0–7.5)

## 2016-10-11 LAB — MICROSCOPIC EXAMINATION
Bacteria, UA: NONE SEEN
WBC UA: NONE SEEN /HPF (ref 0–?)

## 2016-10-11 MED ORDER — SILDENAFIL CITRATE 20 MG PO TABS
ORAL_TABLET | ORAL | 3 refills | Status: DC
Start: 2016-10-11 — End: 2018-01-30

## 2016-10-11 NOTE — Progress Notes (Addendum)
10/11/2016 3:27 PM   Alex Bowers 1955/12/15 [id number]  Referring provider: [name], MD [address] [place], [place] [address]  Chief Complaint  Patient presents with  . Hematuria    1 month follow up   . Benign Prostatic Hypertrophy    HPI: Patient is a 61 year old Caucasian male who presents today for a recheck for microscopic hematuria, ED and BPH with LU TS after one month trial of finasteride and Viagra samples.    Patient states that he has not seen any gross hematuria.  He has seen blood in his semen.  Today his UA is normal.  He completed a hematuria workup in 2017 and was found to have impressive intraluminal bilobar hypertrophy.   He is no longer on testosterone therapy as he found no improvement in his ED on this therapy.    He is also experiencing an increase in his nocturia and daytime urgency and frequency.  He has not had recent fevers, chills, nausea or vomiting.  His PVR was 12 mL.    His I PSS score today is 13/4.  His main complaints are urinary urgency and nocturia x 2.        IPSS    Row Name 10/11/16 1500         International Prostate Symptom Score   How often have you had the sensation of not emptying your bladder? Less than 1 in 5     How often have you had to urinate less than every two hours? About half the time     How often have you found you stopped and started again several times when you urinated? Less than 1 in 5 times     How often have you found it difficult to postpone urination? About half the time     How often have you had a weak urinary stream? About half the time     How often have you had to strain to start urination? Not at All     How many times did you typically get up at night to urinate? 2 Times     Total IPSS Score 13       Quality of Life due to urinary symptoms   If you were to spend the rest of your life with your urinary condition just the way it is now how would you feel about that? Mostly Disatisfied         Score:  1-7 Mild 8-19 Moderate 20-35 Severe   Erectile dysfunction His SHIM score is 12, which is mild to moderate ED.   He has been having difficulty with erections for several years.   His major complaint is achieving an erections.  His libido is preserved.   His risk factors for ED are age, BPH, HTN, smoking and blood pressure medications.  He denies any painful erections or curvatures with his erections.       SHIM    Row Name 10/11/16 1503         SHIM: Over the last 6 months:   How do you rate your confidence that you could get and keep an erection? Very Low     When you had erections with sexual stimulation, how often were your erections hard enough for penetration (entering your partner)? Most Times (much more than half the time)     During sexual intercourse, how often were you able to maintain your erection after you had penetrated (entered) your partner? Difficult  During sexual intercourse, how difficult was it to maintain your erection to completion of intercourse? Very Difficult     When you attempted sexual intercourse, how often was it satisfactory for you? Very Difficult       SHIM Total Score   SHIM 12        Score: 1-7 Severe ED 8-11 Moderate ED 12-16 Mild-Moderate ED 17-21 Mild ED 22-25 No ED      PMH: Past Medical History:  Diagnosis Date  . Hyperlipidemia   . Hypertension   . Personal history of tobacco use, presenting hazards to health 12/29/2015  . Stroke [facility]) 2014    Surgical History: Past Surgical History:  Procedure Laterality Date  . COLONOSCOPY  2015  . HERNIA REPAIR Right 06/09/14   inguinal   . teeth removal       Home Medications:  Allergies as of 10/11/2016   No Known Allergies     Medication List       Accurate as of 10/11/16  3:27 PM. Always use your most recent med list.          amphetamine-dextroamphetamine 10 MG 24 hr capsule Commonly known as:  ADDERALL XR Take 10 mg by mouth daily.   ANDROGEL  PUMP 20.25 MG/ACT (1.62%) Gel Generic drug:  Testosterone Reported on 09/05/2015   aspirin EC 81 MG tablet Take 81 mg by mouth daily.   citalopram 20 MG tablet Commonly known as:  CELEXA Take 20 mg by mouth daily.   finasteride 5 MG tablet Commonly known as:  PROSCAR Take 1 tablet (5 mg total) by mouth daily.   FISH OIL + D3 PO Take by mouth.   lisinopril 10 MG tablet Commonly known as:  PRINIVIL,ZESTRIL Take 10 mg by mouth daily.   sildenafil 100 MG tablet Commonly known as:  VIAGRA Take 1 tablet (100 mg total) by mouth daily as needed for erectile dysfunction.   sildenafil 20 MG tablet Commonly known as:  REVATIO Take 3 to 5 tablets two hours before intercouse on an empty stomach.  Do not take with nitrates.   simvastatin 40 MG tablet Commonly known as:  ZOCOR Take 40 mg by mouth daily.   vitamin C 1000 MG tablet Take 1,000 mg by mouth 2 (two) times daily.       Allergies: No Known Allergies  Family History: Family History  Problem Relation Age of Onset  . Cancer - Other Brother     phageal  . Heart attack Father   . Colon polyps Mother   . Prostate cancer Neg Hx   . Kidney cancer Neg Hx   . Bladder Cancer Neg Hx     Social History:  reports that he has been smoking Cigarettes.  He has a 74.00 pack-year smoking history. He has never used smokeless tobacco. He reports that he drinks alcohol. He reports that he does not use drugs.  ROS: UROLOGY Frequent Urination?: No Hard to postpone urination?: Yes Burning/pain with urination?: No Get up at night to urinate?: Yes Leakage of urine?: No Urine stream starts and stops?: No Trouble starting stream?: No Do you have to strain to urinate?: No Blood in urine?: No Urinary tract infection?: No Sexually transmitted disease?: No Injury to kidneys or bladder?: No Painful intercourse?: No Weak stream?: No Erection problems?: Yes Penile pain?: No  Gastrointestinal Nausea?: No Vomiting?:  No Indigestion/heartburn?: No Diarrhea?: No Constipation?: No  Constitutional Fever: No Night sweats?: No Weight loss?: No Fatigue?: No  Skin Skin rash/lesions?:  No Itching?: No  Eyes Blurred vision?: No Double vision?: No  Ears/Nose/Throat Sore throat?: No Sinus problems?: No  Hematologic/Lymphatic Swollen glands?: No Easy bruising?: No  Cardiovascular Leg swelling?: No Chest pain?: No  Respiratory Cough?: Yes Shortness of breath?: No  Endocrine Excessive thirst?: No  Musculoskeletal Back pain?: No Joint pain?: No  Neurological Headaches?: No Dizziness?: No  Psychologic Depression?: Yes Anxiety?: No  Physical Exam: BP 125/73   Pulse 84   Ht 5\' 8"  (1.727 m)   Wt 155 lb 3.2 oz (70.4 kg)   BMI 23.60 kg/m   Constitutional: Well nourished. Alert and oriented, No acute distress. HEENT: [place] AT, moist mucus membranes. Trachea midline, no masses. Cardiovascular: No clubbing, cyanosis, or edema. Respiratory: Normal respiratory effort, no increased work of breathing. Skin: No rashes, bruises or suspicious lesions. Lymph: No cervical or inguinal adenopathy. Neurologic: Grossly intact, no focal deficits, moving all 4 extremities. Psychiatric: Normal mood and affect.  Laboratory Data: PSA History  1.0 ng/mL on 06/07/2015 Lab Results  Component Value Date   WBC 12.6 (H) 07/19/2014   HGB 16.4 07/19/2014   HCT 49.1 07/19/2014   MCV 99 07/19/2014   PLT 210 07/19/2014    Lab Results  Component Value Date   CREATININE 0.78 07/08/2013       Component Value Date/Time   CHOL 233 (H) 07/09/2013 0407   HDL 35 (L) 07/09/2013 0407   VLDL 19 07/09/2013 0407   LDLCALC 179 (H) 07/09/2013 0407    Lab Results  Component Value Date   AST 24 07/08/2013   Lab Results  Component Value Date   ALT 23 07/08/2013    Urinalysis Unremarkable.  See EPIC.   Assessment & Plan:    1. Microscopic hematuria  - completed hematuria workup 08/2015 - likely  from BPH as he is having hematospermia  - started finasteride at last visit - continue finasteride  - Urinalysis, Complete  - UA unremarkable today  2. BPH with LU TS  - I PSS 13/4  - Continue conservative management, avoiding bladder irritants and timed voiding's  - urgency is most bothersome symptom   - Continue finasteride 5 mg daily  - RTC pending PSA  3. Urgency  - trial of Myrbetriq 50 mg daily, # 28 samples given - I have advised the patient of the side effects of Myrbetriq, such as: elevation in BP, urinary retention and/or HA.- patient already has dry mouth due to his other medications  - RTC in 3 weeks for PVR and OAB questionnaire   4. Erectile dysfunction  - found no benefit with testosterone therapy  - Viagra 100 mg was too expensive; will try sildenafil 20 mg, 3 to 5 tablets prior to intercourse   - RTC in 3 weeks with SHIM  Return in about 3 weeks (around 11/01/2016) for I PSS, PVR and SHIM.  These notes generated with voice recognition software. I apologize for typographical errors.  Zara Council, [facility] [address] [place], [place] [address] [phone]

## 2016-10-12 ENCOUNTER — Telehealth: Payer: Self-pay

## 2016-10-12 LAB — PSA: PROSTATE SPECIFIC AG, SERUM: 0.6 ng/mL (ref 0.0–4.0)

## 2016-10-12 NOTE — Telephone Encounter (Signed)
[name]-most recent labs are normal.  

## 2016-10-12 NOTE — Telephone Encounter (Signed)
-----   Message from Nori Riis, PA-C sent at 10/12/2016 10:29 AM EST ----- Please notify the patient that his PSA was normal.

## 2016-11-01 ENCOUNTER — Encounter: Payer: Self-pay | Admitting: Urology

## 2016-11-01 ENCOUNTER — Ambulatory Visit (INDEPENDENT_AMBULATORY_CARE_PROVIDER_SITE_OTHER): Payer: Medicaid Other | Admitting: Urology

## 2016-11-01 VITALS — BP 154/88 | HR 72 | Ht 68.0 in | Wt 160.0 lb

## 2016-11-01 DIAGNOSIS — N529 Male erectile dysfunction, unspecified: Secondary | ICD-10-CM | POA: Diagnosis not present

## 2016-11-01 DIAGNOSIS — R3915 Urgency of urination: Secondary | ICD-10-CM | POA: Diagnosis not present

## 2016-11-01 DIAGNOSIS — N138 Other obstructive and reflux uropathy: Secondary | ICD-10-CM | POA: Diagnosis not present

## 2016-11-01 DIAGNOSIS — N401 Enlarged prostate with lower urinary tract symptoms: Secondary | ICD-10-CM | POA: Diagnosis not present

## 2016-11-01 LAB — BLADDER SCAN AMB NON-IMAGING: SCAN RESULT: 0

## 2016-11-01 NOTE — Progress Notes (Signed)
11/01/2016 5:24 PM   Alex Bowers Nov 25, 1955 [id number]  Referring provider: [name], MD [address] [place], [place] [address]  Chief Complaint  Patient presents with  . Erectile Dysfunction    3 week follow up  . Urinary Urgency    HPI: 61 yo WM who presents today for a 3 week follow up after a trial of Myrbetriq 50 mg daily for urgency and sildenafil 20 mg, 3 to 5 tablets for ED.  Background history Patient is a 61 year old Caucasian male who presents today for a recheck for microscopic hematuria, ED and BPH with LU TS after one month trial of finasteride and Viagra samples.  Patient states that he has not seen any gross hematuria.  He has seen blood in his semen.  Today his UA is normal.  He completed a hematuria workup in 2017 and was found to have impressive intraluminal bilobar hypertrophy.   He is no longer on testosterone therapy as he found no improvement in his ED on this therapy.  He is also experiencing an increase in his nocturia and daytime urgency and frequency.  He has not had recent fevers, chills, nausea or vomiting.  His PVR was 12 mL.    His I PSS score today is 14/3.  His previous I PSS score was 13/4.  He was given Myrbetriq 50 mg samples at his last visit.  He stopped the medication after one week due to severe diarrhea.  His main complaints are urinary urgency and nocturia x 2.        IPSS    Row Name 10/11/16 1500 11/01/16 1500       International Prostate Symptom Score   How often have you had the sensation of not emptying your bladder? Less than 1 in 5 Less than half the time    How often have you had to urinate less than every two hours? About half the time About half the time    How often have you found you stopped and started again several times when you urinated? Less than 1 in 5 times Less than half the time    How often have you found it difficult to postpone urination? About half the time Less than half the time    How often  have you had a weak urinary stream? About half the time About half the time    How often have you had to strain to start urination? Not at All Not at All    How many times did you typically get up at night to urinate? 2 Times 2 Times    Total IPSS Score 13 14      Quality of Life due to urinary symptoms   If you were to spend the rest of your life with your urinary condition just the way it is now how would you feel about that? Mostly Disatisfied Mixed       Score:  1-7 Mild 8-19 Moderate 20-35 Severe   Erectile dysfunction His SHIM score is 14, which is mild to moderate ED.   His previous SHIM score was 12.  He has been having difficulty with erections for several years.   His major complaint is achieving an erections.  His libido is preserved.   His risk factors for ED are age, BPH, HTN, smoking and blood pressure medications.  He denies any painful erections or curvatures with his erections.       SHIM    Row  Name 10/11/16 1503 11/01/16 1504       SHIM: Over the last 6 months:   How do you rate your confidence that you could get and keep an erection? Very Low Low    When you had erections with sexual stimulation, how often were your erections hard enough for penetration (entering your partner)? Most Times (much more than half the time) Sometimes (about half the time)    During sexual intercourse, how often were you able to maintain your erection after you had penetrated (entered) your partner? Difficult Difficult    During sexual intercourse, how difficult was it to maintain your erection to completion of intercourse? Very Difficult Difficult    When you attempted sexual intercourse, how often was it satisfactory for you? Very Difficult Difficult      SHIM Total Score   SHIM 12 14       Score: 1-7 Severe ED 8-11 Moderate ED 12-16 Mild-Moderate ED 17-21 Mild ED 22-25 No ED    PMH: Past Medical History:  Diagnosis Date  . Hyperlipidemia   . Hypertension   . Personal  history of tobacco use, presenting hazards to health 12/29/2015  . Stroke [facility]) 2014    Surgical History: Past Surgical History:  Procedure Laterality Date  . COLONOSCOPY  2015  . HERNIA REPAIR Right 06/09/14   inguinal   . teeth removal       Home Medications:  Allergies as of 11/01/2016   No Known Allergies     Medication List       Accurate as of 11/01/16  5:24 PM. Always use your most recent med list.          amphetamine-dextroamphetamine 10 MG 24 hr capsule Commonly known as:  ADDERALL XR Take 10 mg by mouth daily.   ANDROGEL PUMP 20.25 MG/ACT (1.62%) Gel Generic drug:  Testosterone Reported on 09/05/2015   aspirin EC 81 MG tablet Take 81 mg by mouth daily.   citalopram 20 MG tablet Commonly known as:  CELEXA Take 20 mg by mouth daily.   finasteride 5 MG tablet Commonly known as:  PROSCAR Take 1 tablet (5 mg total) by mouth daily.   FISH OIL + D3 PO Take by mouth.   lisinopril 10 MG tablet Commonly known as:  PRINIVIL,ZESTRIL Take 10 mg by mouth daily.   sildenafil 100 MG tablet Commonly known as:  VIAGRA Take 1 tablet (100 mg total) by mouth daily as needed for erectile dysfunction.   sildenafil 20 MG tablet Commonly known as:  REVATIO Take 3 to 5 tablets two hours before intercouse on an empty stomach.  Do not take with nitrates.   simvastatin 40 MG tablet Commonly known as:  ZOCOR Take 40 mg by mouth daily.   vitamin C 1000 MG tablet Take 1,000 mg by mouth 2 (two) times daily.       Allergies: No Known Allergies  Family History: Family History  Problem Relation Age of Onset  . Cancer - Other Brother     phageal  . Heart attack Father   . Colon polyps Mother   . Prostate cancer Neg Hx   . Kidney cancer Neg Hx   . Bladder Cancer Neg Hx     Social History:  reports that he has been smoking Cigarettes.  He has a 74.00 pack-year smoking history. He has never used smokeless tobacco. He reports that he drinks alcohol. He reports that  he does not use drugs.  ROS: UROLOGY Frequent Urination?: No  Hard to postpone urination?: No Burning/pain with urination?: No Get up at night to urinate?: Yes Leakage of urine?: No Urine stream starts and stops?: Yes Trouble starting stream?: No Do you have to strain to urinate?: No Blood in urine?: No Urinary tract infection?: No Sexually transmitted disease?: No Injury to kidneys or bladder?: No Painful intercourse?: No Weak stream?: No Erection problems?: Yes Penile pain?: No  Gastrointestinal Nausea?: No Vomiting?: No Indigestion/heartburn?: No Diarrhea?: No Constipation?: No  Constitutional Fever: No Night sweats?: No Weight loss?: No Fatigue?: No  Skin Skin rash/lesions?: No Itching?: No  Eyes Blurred vision?: No Double vision?: No  Ears/Nose/Throat Sore throat?: No Sinus problems?: No  Hematologic/Lymphatic Swollen glands?: No Easy bruising?: No  Cardiovascular Leg swelling?: No Chest pain?: No  Respiratory Cough?: Yes Shortness of breath?: No  Endocrine Excessive thirst?: No  Musculoskeletal Back pain?: No Joint pain?: No  Neurological Headaches?: No Dizziness?: No  Psychologic Depression?: Yes Anxiety?: No  Physical Exam: BP (!) 154/88   Pulse 72   Ht 5\' 8"  (1.727 m)   Wt 160 lb (72.6 kg)   BMI 24.33 kg/m   Constitutional: Well nourished. Alert and oriented, No acute distress. HEENT: [place] AT, moist mucus membranes. Trachea midline, no masses. Cardiovascular: No clubbing, cyanosis, or edema. Respiratory: Normal respiratory effort, no increased work of breathing. Skin: No rashes, bruises or suspicious lesions. Lymph: No cervical or inguinal adenopathy. Neurologic: Grossly intact, no focal deficits, moving all 4 extremities. Psychiatric: Normal mood and affect.  Laboratory Data: PSA History  1.0 ng/mL on 06/07/2015  0.6 ng/mL on 10/11/2016  Lab Results  Component Value Date   WBC 12.6 (H) 07/19/2014   HGB 16.4  07/19/2014   HCT 49.1 07/19/2014   MCV 99 07/19/2014   PLT 210 07/19/2014    Lab Results  Component Value Date   CREATININE 0.78 07/08/2013       Component Value Date/Time   CHOL 233 (H) 07/09/2013 0407   HDL 35 (L) 07/09/2013 0407   VLDL 19 07/09/2013 0407   LDLCALC 179 (H) 07/09/2013 0407    Lab Results  Component Value Date   AST 24 07/08/2013   Lab Results  Component Value Date   ALT 23 07/08/2013     Assessment & Plan:    1. Microscopic hematuria  - completed hematuria workup 08/2015 - likely from BPH as he is having hematospermia  - started finasteride at last visit - continue finasteride  - UA on 10/11/2016 was negative for [facility]  - check UA yearly  2. BPH with LU TS  - I PSS 13/4  - Continue conservative management, avoiding bladder irritants and timed voiding's  - urgency is most bothersome symptom   - Continue finasteride 5 mg daily  - RTC in  3 weeks for I PSS and PVR  3. Urgency  - failed Myrbetriq - ADR of diarrhea  - does not want to try anticholinergics - due to already having dry mouth  - discussed PT and PTNS - insurance will not cover   - will try Toviaz 8 mg daily  - RTC in 3 weeks for I PSS and PVR  4. Erectile dysfunction  - found no benefit with testosterone therapy  - Viagra 100 mg was too expensive; will try sildenafil 20 mg, 3 to 5 tablets prior to intercourse - hasn't tried yet due to not having a partner    Return in about 3 weeks (around 11/22/2016) for IPSS and PVR.  These notes  generated with voice recognition software. I apologize for typographical errors.  Zara Council, [facility] [address] [place], [place] [address] [phone]

## 2016-11-26 NOTE — Progress Notes (Signed)
11/27/2016 8:30 AM   Alex Bowers 1955/12/23 [id number]  Referring provider: [name], MD [address] [place], [place] [address]  Chief Complaint  Patient presents with  . Benign Prostatic Hypertrophy    3 week follow up     HPI: 61 yo WM who presents today for a 3 week follow up after a trial of Toviaz 8 mg daily for urgency.  Background history Patient is a 61 year old Caucasian male who presents today for a recheck for microscopic hematuria, ED and BPH with LU TS after one month trial of finasteride and Viagra samples.  Patient states that he has not seen any gross hematuria.  He has seen blood in his semen.   UA was unremarkable on 10/11/2016.  He completed a hematuria workup in 2017 and was found to have impressive intraluminal bilobar hypertrophy.   He is no longer on testosterone therapy as he found no improvement in his ED on this therapy.  He is also experiencing an increase in his nocturia and daytime urgency and frequency.  He has not had recent fevers, chills, nausea or vomiting.  His PVR was 12 mL.    His I PSS score today is 12/3.  His previous I PSS score was 14/3.  He was initially tried on Myrbetriq, but he had discontinued the medication due to diarrhea.  He was given Toviaz 8 mg samples at his last visit.  He did not continue the Toviaz sample due to constipation.  His main complaints are urinary urgency and nocturia x 2.  His PVR was 0 mL.        IPSS    Row Name 10/11/16 1500 11/01/16 1500 11/27/16 1400     International Prostate Symptom Score   How often have you had the sensation of not emptying your bladder? Less than 1 in 5 Less than half the time Less than 1 in 5   How often have you had to urinate less than every two hours? About half the time About half the time Less than half the time   How often have you found you stopped and started again several times when you urinated? Less than 1 in 5 times Less than half the time Less than half the  time   How often have you found it difficult to postpone urination? About half the time Less than half the time Less than half the time   How often have you had a weak urinary stream? About half the time About half the time Less than half the time   How often have you had to strain to start urination? Not at All Not at All Less than 1 in 5 times   How many times did you typically get up at night to urinate? 2 Times 2 Times 2 Times   Total IPSS Score 13 14 12      Quality of Life due to urinary symptoms   If you were to spend the rest of your life with your urinary condition just the way it is now how would you feel about that? Mostly Disatisfied Mixed Mixed      Score:  1-7 Mild 8-19 Moderate 20-35 Severe   Erectile dysfunction His SHIM score is 14, which is mild to moderate ED.   His previous SHIM score was 12.  He has been having difficulty with erections for several years.   His major complaint is achieving an erections.  His libido is preserved.  His risk factors for ED are age, BPH, HTN, smoking and blood pressure medications.  He denies any painful erections or curvatures with his erections.       SHIM    Row Name 10/11/16 1503 11/01/16 1504       SHIM: Over the last 6 months:   How do you rate your confidence that you could get and keep an erection? Very Low Low    When you had erections with sexual stimulation, how often were your erections hard enough for penetration (entering your partner)? Most Times (much more than half the time) Sometimes (about half the time)    During sexual intercourse, how often were you able to maintain your erection after you had penetrated (entered) your partner? Difficult Difficult    During sexual intercourse, how difficult was it to maintain your erection to completion of intercourse? Very Difficult Difficult    When you attempted sexual intercourse, how often was it satisfactory for you? Very Difficult Difficult      SHIM Total Score   SHIM  12 14       Score: 1-7 Severe ED 8-11 Moderate ED 12-16 Mild-Moderate ED 17-21 Mild ED 22-25 No ED    PMH: Past Medical History:  Diagnosis Date  . Hyperlipidemia   . Hypertension   . Personal history of tobacco use, presenting hazards to health 12/29/2015  . Stroke [facility]) 2014    Surgical History: Past Surgical History:  Procedure Laterality Date  . COLONOSCOPY  2015  . HERNIA REPAIR Right 06/09/14   inguinal   . teeth removal       Home Medications:  Allergies as of 11/27/2016   No Known Allergies     Medication List       Accurate as of 11/27/16 11:59 PM. Always use your most recent med list.          amphetamine-dextroamphetamine 10 MG 24 hr capsule Commonly known as:  ADDERALL XR Take 10 mg by mouth daily.   ANDROGEL PUMP 20.25 MG/ACT (1.62%) Gel Generic drug:  Testosterone Reported on 09/05/2015   aspirin EC 81 MG tablet Take 81 mg by mouth daily.   citalopram 20 MG tablet Commonly known as:  CELEXA Take 20 mg by mouth daily.   finasteride 5 MG tablet Commonly known as:  PROSCAR Take 1 tablet (5 mg total) by mouth daily.   FISH OIL + D3 PO Take by mouth.   lisinopril 10 MG tablet Commonly known as:  PRINIVIL,ZESTRIL Take 10 mg by mouth daily.   MULTI VITAMIN MENS PO Take by mouth.   sildenafil 100 MG tablet Commonly known as:  VIAGRA Take 1 tablet (100 mg total) by mouth daily as needed for erectile dysfunction.   sildenafil 20 MG tablet Commonly known as:  REVATIO Take 3 to 5 tablets two hours before intercouse on an empty stomach.  Do not take with nitrates.   simvastatin 40 MG tablet Commonly known as:  ZOCOR Take 40 mg by mouth daily.   vitamin C 1000 MG tablet Take 1,000 mg by mouth 2 (two) times daily.       Allergies: No Known Allergies  Family History: Family History  Problem Relation Age of Onset  . Cancer - Other Brother     phageal  . Heart attack Father   . Colon polyps Mother   . Prostate cancer Neg Hx     . Kidney cancer Neg Hx   . Bladder Cancer Neg Hx  Social History:  reports that he has been smoking Cigarettes.  He has a 74.00 pack-year smoking history. He has never used smokeless tobacco. He reports that he drinks alcohol. He reports that he does not use drugs.  ROS: UROLOGY Frequent Urination?: Yes Hard to postpone urination?: Yes Burning/pain with urination?: No Get up at night to urinate?: Yes Leakage of urine?: No Urine stream starts and stops?: No Trouble starting stream?: No Do you have to strain to urinate?: No Blood in urine?: No Urinary tract infection?: No Sexually transmitted disease?: No Injury to kidneys or bladder?: No Painful intercourse?: No Weak stream?: No Erection problems?: No Penile pain?: No  Gastrointestinal Nausea?: No Vomiting?: No Indigestion/heartburn?: No Diarrhea?: No Constipation?: No  Constitutional Fever: No Night sweats?: No Weight loss?: No Fatigue?: No  Skin Skin rash/lesions?: No Itching?: No  Eyes Blurred vision?: No Double vision?: No  Ears/Nose/Throat Sore throat?: No Sinus problems?: No  Hematologic/Lymphatic Swollen glands?: No Easy bruising?: No  Cardiovascular Leg swelling?: No Chest pain?: No  Respiratory Cough?: Yes Shortness of breath?: No  Endocrine Excessive thirst?: No  Musculoskeletal Back pain?: No Joint pain?: No  Neurological Headaches?: No Dizziness?: No  Psychologic Depression?: Yes Anxiety?: No  Physical Exam: BP 130/81 (BP Location: Left Arm, Patient Position: Sitting, Cuff Size: Normal)   Pulse 83   Ht 5\' 8"  (1.727 m)   Wt 155 lb 8 oz (70.5 kg)   BMI 23.64 kg/m   Constitutional: Well nourished. Alert and oriented, No acute distress. HEENT: [place] AT, moist mucus membranes. Trachea midline, no masses. Cardiovascular: No clubbing, cyanosis, or edema. Respiratory: Normal respiratory effort, no increased work of breathing. Skin: No rashes, bruises or suspicious  lesions. Lymph: No cervical or inguinal adenopathy. Neurologic: Grossly intact, no focal deficits, moving all 4 extremities. Psychiatric: Normal mood and affect.  Laboratory Data: PSA History  1.0 ng/mL on 06/07/2015  0.6 ng/mL on 10/11/2016  Lab Results  Component Value Date   WBC 12.6 (H) 07/19/2014   HGB 16.4 07/19/2014   HCT 49.1 07/19/2014   MCV 99 07/19/2014   PLT 210 07/19/2014    Lab Results  Component Value Date   CREATININE 0.78 07/08/2013       Component Value Date/Time   CHOL 233 (H) 07/09/2013 0407   HDL 35 (L) 07/09/2013 0407   VLDL 19 07/09/2013 0407   LDLCALC 179 (H) 07/09/2013 0407    Lab Results  Component Value Date   AST 24 07/08/2013   Lab Results  Component Value Date   ALT 23 07/08/2013     Assessment & Plan:    1. Urgency  - failed Myrbetriq - ADR of diarrhea and failed Toviaz 8 mg daily - ADR of constipation  - discussed PT and PTNS - insurance will not cover   - failed pharmacologic therapy - will arrange an appointment with Dr. Matilde Sprang for further assessment   2. BPH with LU TS  - I PSS 12/3, it is improving  - Continue conservative management, avoiding bladder irritants and timed voiding's  - urgency is most bothersome symptom   - Continue finasteride 5 mg daily  3. Erectile dysfunction (Not addressed at this visit)  - found no benefit with testosterone therapy  - Viagra 100 mg was too expensive; will try sildenafil 20 mg, 3 to 5 tablets prior to intercourse - hasn't tried yet due to not having a partner  4. Microscopic hematuria (Not addressed at this visit)  - completed hematuria workup 08/2015 -  likely from BPH as he is having hematospermia  - started finasteride at last visit - continue finasteride  - UA on 10/11/2016 was negative for [facility]  - check UA yearly  Return for appointment with Dr. Matilde Sprang.  These notes generated with voice recognition software. I apologize for typographical errors.  Zara Council,  [facility] [address] [place], [place] [address] [phone]

## 2016-11-27 ENCOUNTER — Ambulatory Visit (INDEPENDENT_AMBULATORY_CARE_PROVIDER_SITE_OTHER): Payer: Medicaid Other | Admitting: Urology

## 2016-11-27 ENCOUNTER — Encounter: Payer: Self-pay | Admitting: Urology

## 2016-11-27 VITALS — BP 130/81 | HR 83 | Ht 68.0 in | Wt 155.5 lb

## 2016-11-27 DIAGNOSIS — R3915 Urgency of urination: Secondary | ICD-10-CM | POA: Diagnosis not present

## 2016-11-27 DIAGNOSIS — N138 Other obstructive and reflux uropathy: Secondary | ICD-10-CM | POA: Diagnosis not present

## 2016-11-27 DIAGNOSIS — N400 Enlarged prostate without lower urinary tract symptoms: Secondary | ICD-10-CM | POA: Diagnosis not present

## 2016-11-27 DIAGNOSIS — N401 Enlarged prostate with lower urinary tract symptoms: Secondary | ICD-10-CM | POA: Diagnosis not present

## 2016-11-27 LAB — BLADDER SCAN AMB NON-IMAGING: SCAN RESULT: 0

## 2016-11-30 ENCOUNTER — Telehealth: Payer: Self-pay | Admitting: Urology

## 2016-11-30 NOTE — Telephone Encounter (Signed)
[name]- will need an appt with Dr. Matilde Sprang. Call back to make appt.

## 2016-11-30 NOTE — Telephone Encounter (Signed)
Would you please call the patient and let him know that he will need to see Dr. Ila Mcgill for further evaluation and make him an appointment?

## 2016-12-11 ENCOUNTER — Ambulatory Visit (INDEPENDENT_AMBULATORY_CARE_PROVIDER_SITE_OTHER): Payer: Medicaid Other | Admitting: Urology

## 2016-12-11 DIAGNOSIS — R351 Nocturia: Secondary | ICD-10-CM

## 2016-12-11 MED ORDER — TAMSULOSIN HCL 0.4 MG PO CAPS: 0.4000 mg | ORAL_CAPSULE | Freq: Every day | ORAL | 11 refills | Status: DC

## 2016-12-11 NOTE — Progress Notes (Signed)
12/11/2016 2:52 PM   Alex Bowers February 09, 1956 [id number]  Referring provider: [name], MD [address] [place], [place] [address]  Chief Complaint  Patient presents with  . Follow-up    urinary urgency    HPI: The patient has been followed by [name]. He has a history of microscopic hematuria and has been on finasteride and Viagra for BPH and erectile dysfunction. He is noted to have impressive bilobar enlargement of the prostate and has been cleared for blood in the urine. He is known to have a low residual. The patient has failed Toviaz and the beta 3 agonists.  Today The patient voids every 2 hours or longer. He gets up twice at night. Perhaps one time per week if he holds it too long he can have urge incontinence but does not wear a pad. His flow is weak and he does not hesitate or strain. He has had a stroke.  Generic Viagra has been prescribed.    PMH: Past Medical History:  Diagnosis Date  . Hyperlipidemia   . Hypertension   . Personal history of tobacco use, presenting hazards to health 12/29/2015  . Stroke [facility]) 2014    Surgical History: Past Surgical History:  Procedure Laterality Date  . COLONOSCOPY  2015  . HERNIA REPAIR Right 06/09/14   inguinal   . teeth removal       Home Medications:  Allergies as of 12/11/2016   No Known Allergies     Medication List       Accurate as of 12/11/16  2:52 PM. Always use your most recent med list.          amphetamine-dextroamphetamine 10 MG 24 hr capsule Commonly known as:  ADDERALL XR Take 10 mg by mouth daily.   amphetamine-dextroamphetamine 30 MG tablet Commonly known as:  ADDERALL Take 1 tablet by mouth 2 (two) times daily.   ANDROGEL PUMP 20.25 MG/ACT (1.62%) Gel Generic drug:  Testosterone Reported on 09/05/2015   aspirin EC 81 MG tablet Take 81 mg by mouth daily.   citalopram 20 MG tablet Commonly known as:  CELEXA Take 20 mg by mouth daily.   finasteride 5 MG tablet Commonly  known as:  PROSCAR Take 1 tablet (5 mg total) by mouth daily.   FISH OIL + D3 PO Take by mouth.   lisinopril 10 MG tablet Commonly known as:  PRINIVIL,ZESTRIL Take 10 mg by mouth daily.   MULTI VITAMIN MENS PO Take by mouth.   sildenafil 100 MG tablet Commonly known as:  VIAGRA Take 1 tablet (100 mg total) by mouth daily as needed for erectile dysfunction.   sildenafil 20 MG tablet Commonly known as:  REVATIO Take 3 to 5 tablets two hours before intercouse on an empty stomach.  Do not take with nitrates.   simvastatin 40 MG tablet Commonly known as:  ZOCOR Take 40 mg by mouth daily.   tamsulosin 0.4 MG Caps capsule Commonly known as:  FLOMAX Take 1 capsule (0.4 mg total) by mouth daily.   vitamin C 1000 MG tablet Take 1,000 mg by mouth 2 (two) times daily.       Allergies: No Known Allergies  Family History: Family History  Problem Relation Age of Onset  . Cancer - Other Brother     phageal  . Heart attack Father   . Colon polyps Mother   . Prostate cancer Neg Hx   . Kidney cancer Neg Hx   . Bladder Cancer Neg Hx  Social History:  reports that he has been smoking Cigarettes.  He has a 74.00 pack-year smoking history. He has never used smokeless tobacco. He reports that he drinks alcohol. He reports that he does not use drugs.  ROS: UROLOGY Frequent Urination?: Yes Hard to postpone urination?: No Burning/pain with urination?: No Get up at night to urinate?: Yes Leakage of urine?: No Urine stream starts and stops?: No Trouble starting stream?: No Do you have to strain to urinate?: No Blood in urine?: No Urinary tract infection?: No Sexually transmitted disease?: No Injury to kidneys or bladder?: No Painful intercourse?: No Weak stream?: No Erection problems?: Yes Penile pain?: No  Gastrointestinal Nausea?: No Vomiting?: No Indigestion/heartburn?: No Diarrhea?: No Constipation?: No  Constitutional Fever: No Night sweats?: No Weight  loss?: No Fatigue?: No  Skin Skin rash/lesions?: No Itching?: No  Eyes Blurred vision?: No Double vision?: No  Ears/Nose/Throat Sore throat?: No Sinus problems?: No  Hematologic/Lymphatic Swollen glands?: No Easy bruising?: No  Cardiovascular Leg swelling?: No Chest pain?: No  Respiratory Cough?: Yes Shortness of breath?: No  Endocrine Excessive thirst?: No  Musculoskeletal Back pain?: No Joint pain?: No  Neurological Headaches?: No Dizziness?: No  Psychologic Depression?: Yes Anxiety?: No  Physical Exam: There were no vitals taken for this visit.  Constitutional:  Alert and oriented, No acute distress.  Laboratory Data: Lab Results  Component Value Date   WBC 12.6 (H) 07/19/2014   HGB 16.4 07/19/2014   HCT 49.1 07/19/2014   MCV 99 07/19/2014   PLT 210 07/19/2014    Lab Results  Component Value Date   CREATININE 0.78 07/08/2013    No results found for: PSA  No results found for: TESTOSTERONE  No results found for: HGBA1C  Urinalysis    Component Value Date/Time   COLORURINE Yellow 07/08/2013 1744   APPEARANCEUR Clear 10/11/2016 1452   LABSPEC 1.014 07/08/2013 1744   PHURINE 6.0 07/08/2013 1744   GLUCOSEU Negative 10/11/2016 1452   GLUCOSEU Negative 07/08/2013 1744   HGBUR Negative 07/08/2013 1744   BILIRUBINUR Negative 10/11/2016 1452   BILIRUBINUR Negative 07/08/2013 1744   KETONESUR Negative 07/08/2013 1744   PROTEINUR Negative 10/11/2016 1452   PROTEINUR Negative 07/08/2013 1744   NITRITE Negative 10/11/2016 1452   NITRITE Negative 07/08/2013 1744   LEUKOCYTESUR Negative 10/11/2016 1452   LEUKOCYTESUR Negative 07/08/2013 1744    Pertinent Imaging: none  Assessment & Plan:  The patient and I spoke about his lower urinary tract symptoms. There are either from an overactive bladder or bladder outlet obstruction. He finasteride. He will try Flomax and I will reassess him in 6 weeks. He understands that if this is not reaches  goal the next step would be urodynamics. We would not order this test if he did not think he would proceed with surgical treatments. Based upon the severity of his symptoms I would not be surprised that he did not pursue more aggressive options.  He understands that I do not have new treatment options for his erectile dysfunction  There are no diagnoses linked to this encounter.  Return in about 6 weeks (around 01/22/2017).  Reece Packer, MD  [facility] [address] [place], [place] [address] [phone]

## 2017-01-21 ENCOUNTER — Ambulatory Visit: Payer: Medicaid Other

## 2017-02-04 ENCOUNTER — Encounter: Payer: Self-pay | Admitting: Urology

## 2017-02-04 ENCOUNTER — Ambulatory Visit (INDEPENDENT_AMBULATORY_CARE_PROVIDER_SITE_OTHER): Payer: Medicaid Other | Admitting: Urology

## 2017-02-04 VITALS — BP 138/78 | HR 88 | Ht 68.0 in | Wt 150.0 lb

## 2017-02-04 DIAGNOSIS — R350 Frequency of micturition: Secondary | ICD-10-CM | POA: Diagnosis not present

## 2017-02-04 MED ORDER — SOLIFENACIN SUCCINATE 5 MG PO TABS: 5.0000 mg | ORAL_TABLET | Freq: Every day | ORAL | 11 refills | Status: DC

## 2017-02-04 MED ORDER — FESOTERODINE FUMARATE ER 8 MG PO TB24: 8.0000 mg | ORAL_TABLET | Freq: Every day | ORAL | 11 refills | Status: DC

## 2017-02-04 NOTE — Progress Notes (Signed)
02/04/2017 3:32 PM   Alex Bowers 04-06-1956 [id number]  Referring provider: [name], [place], [place] [address]  Chief Complaint  Patient presents with  . Nocturia    6wk    HPI: The patient has been followed by [name]. He has a history of microscopic hematuria and has been on finasteride and Viagra for BPH and erectile dysfunction. He is noted to have impressive bilobar enlargement of the prostate and has been cleared for blood in the urine. He is known to have a low residual. The patient has failed Toviaz and the beta 3 agonists.  The patient voids every 2 hours or longer. He gets up twice at night. Perhaps one time per week if he holds it too long he can have urge incontinence but does not wear a pad. His flow is weak and he does not hesitate or strain. He has had a stroke.  Generic Viagra has been prescribed. He has failed the beta 3 agonists  Today 6 weeks ago I prescribed Flomax. The role of possible urodynamics was discussed last time but his symptoms were not that severe. Last time he understood I did not have new treatment options for erectile dysfunction. In the past Dr. [name] noted a 65 mL prostate. In January 2007 he was noted to have impressive bilobar enlargement of the prostate and no frank middle lobe.   PMH: Past Medical History:  Diagnosis Date  . Hyperlipidemia   . Hypertension   . Personal history of tobacco use, presenting hazards to health 12/29/2015  . Stroke [facility]) 2014    Surgical History: Past Surgical History:  Procedure Laterality Date  . COLONOSCOPY  2015  . HERNIA REPAIR Right 06/09/14   inguinal   . teeth removal       Home Medications:  Allergies as of 02/04/2017   No Known Allergies     Medication List       Accurate as of 02/04/17  3:32 PM. Always use your most recent med list.          amphetamine-dextroamphetamine 30 MG tablet Commonly known as:  ADDERALL Take 1 tablet by mouth 2 (two)  times daily.   aspirin EC 81 MG tablet Take 81 mg by mouth daily.   citalopram 20 MG tablet Commonly known as:  CELEXA Take 20 mg by mouth daily.   fesoterodine 8 MG Tb24 tablet Commonly known as:  TOVIAZ Take 1 tablet (8 mg total) by mouth daily.   finasteride 5 MG tablet Commonly known as:  PROSCAR Take 1 tablet (5 mg total) by mouth daily.   FISH OIL + D3 PO Take by mouth.   lisinopril 10 MG tablet Commonly known as:  PRINIVIL,ZESTRIL Take 10 mg by mouth daily.   MULTI VITAMIN MENS PO Take by mouth.   sildenafil 20 MG tablet Commonly known as:  REVATIO Take 3 to 5 tablets two hours before intercouse on an empty stomach.  Do not take with nitrates.   simvastatin 40 MG tablet Commonly known as:  ZOCOR Take 40 mg by mouth daily.   solifenacin 5 MG tablet Commonly known as:  VESICARE Take 1 tablet (5 mg total) by mouth daily.   tamsulosin 0.4 MG Caps capsule Commonly known as:  FLOMAX Take 1 capsule (0.4 mg total) by mouth daily.   vitamin C 1000 MG tablet Take 1,000 mg by mouth 2 (two) times daily.       Allergies: No Known Allergies  Family History:  Family History  Problem Relation Age of Onset  . Cancer - Other Brother        phageal  . Heart attack Father   . Colon polyps Mother   . Prostate cancer Neg Hx   . Kidney cancer Neg Hx   . Bladder Cancer Neg Hx     Social History:  reports that he has been smoking Cigarettes.  He has a 74.00 pack-year smoking history. He has never used smokeless tobacco. He reports that he drinks alcohol. He reports that he does not use drugs.  ROS: UROLOGY Frequent Urination?: No Hard to postpone urination?: Yes Burning/pain with urination?: No Get up at night to urinate?: Yes Leakage of urine?: No Urine stream starts and stops?: Yes Trouble starting stream?: No Do you have to strain to urinate?: No Blood in urine?: No Urinary tract infection?: No Sexually transmitted disease?: No Injury to kidneys or  bladder?: No Painful intercourse?: No Weak stream?: No Erection problems?: Yes Penile pain?: No  Gastrointestinal Nausea?: No Vomiting?: No Indigestion/heartburn?: No Diarrhea?: Yes Constipation?: No  Constitutional Fever: No Night sweats?: No Weight loss?: No Fatigue?: No  Skin Skin rash/lesions?: No Itching?: No  Eyes Blurred vision?: No Double vision?: No  Ears/Nose/Throat Sore throat?: No Sinus problems?: No  Hematologic/Lymphatic Swollen glands?: No Easy bruising?: No  Cardiovascular Leg swelling?: No Chest pain?: No  Respiratory Cough?: Yes Shortness of breath?: No  Endocrine Excessive thirst?: No  Musculoskeletal Back pain?: No Joint pain?: No  Neurological Headaches?: No Dizziness?: No  Psychologic Depression?: Yes Anxiety?: No  Physical Exam: BP 138/78   Pulse 88   Ht 5\' 8"  (1.727 m)   Wt 68 kg (150 lb)   BMI 22.81 kg/m   Constitutional:  Alert and oriented, No acute distress.   Laboratory Data: Lab Results  Component Value Date   WBC 12.6 (H) 07/19/2014   HGB 16.4 07/19/2014   HCT 49.1 07/19/2014   MCV 99 07/19/2014   PLT 210 07/19/2014    Lab Results  Component Value Date   CREATININE 0.78 07/08/2013    No results found for: PSA  No results found for: TESTOSTERONE  No results found for: HGBA1C  Urinalysis    Component Value Date/Time   COLORURINE Yellow 07/08/2013 1744   APPEARANCEUR Clear 10/11/2016 1452   LABSPEC 1.014 07/08/2013 1744   PHURINE 6.0 07/08/2013 1744   GLUCOSEU Negative 10/11/2016 1452   GLUCOSEU Negative 07/08/2013 1744   HGBUR Negative 07/08/2013 1744   BILIRUBINUR Negative 10/11/2016 1452   BILIRUBINUR Negative 07/08/2013 1744   KETONESUR Negative 07/08/2013 1744   PROTEINUR Negative 10/11/2016 1452   PROTEINUR Negative 07/08/2013 1744   NITRITE Negative 10/11/2016 1452   NITRITE Negative 07/08/2013 1744   LEUKOCYTESUR Negative 10/11/2016 1452   LEUKOCYTESUR Negative 07/08/2013  1744    Pertinent Imaging:   Assessment & Plan: The patient's decreased flow is stable but he had diarrhea and possibly dry ejaculation and stop the Flomax. He still is on finasteride. He reports he can live with the decreased flow and does not want urodynamics her surgery. He does report that is now are really frequency is what is affecting his quality of life the most. He thinks he had constipation from the beta 3 agonist. Recognizing he does not tolerate medication well I will see him in 2 months. I gave him samples and prescription of Toviaz 8 mg not having a lower dose samples and Vesicare 5 mg.  There are no diagnoses linked to this  encounter.  No Follow-up on file.  Reece Packer, MD  [facility] [address] [place], [place] [address] [phone]

## 2017-04-01 ENCOUNTER — Encounter: Payer: Self-pay | Admitting: Urology

## 2017-04-01 ENCOUNTER — Ambulatory Visit (INDEPENDENT_AMBULATORY_CARE_PROVIDER_SITE_OTHER): Payer: Medicaid Other | Admitting: Urology

## 2017-04-01 VITALS — BP 144/87 | HR 79 | Ht 68.0 in | Wt 156.4 lb

## 2017-04-01 DIAGNOSIS — N3941 Urge incontinence: Secondary | ICD-10-CM

## 2017-04-01 NOTE — Progress Notes (Signed)
04/01/2017 2:34 PM   Alex Bowers September 16, 1955 [id number]  Referring provider: Lavera Bowers, Alex Bowers, Alex Bowers [id number]  Chief Complaint  Patient presents with  . Follow-up    2 month    HPI: The patient has been followed by Alex Bowers. He has a history of microscopic hematuria and has been on finasteride and Viagra for BPH and erectile dysfunction. He is noted to have impressive bilobar enlargement of the prostate and has been cleared for blood in the urine. He is known to have a low residual. The patient has failed Toviaz and the beta 3 agonists.  6 weeks ago I prescribed Flomax. The role of possible urodynamics was discussed last time but his symptoms were not that severe. Last time he understood I did not have new treatment options for erectile dysfunction. In the past Dr. Tresa Bowers noted a 65 mL prostate. In January 2007 he was noted to have impressive bilobar enlargement of the prostate and no frank middle lobe.  Today The patient voids every 2 hours or longer. He gets up twice at night. Perhaps one time per week if he holds it too long he can have urge incontinence but does not wear a pad. His flow is weak and he does not hesitate or strain. He has had a stroke.  Generic Viagra has been prescribed.  patient and I spoke about his lower urinary tract symptoms. There are either from an overactive bladder or bladder outlet obstruction. He finasteride. He will try Flomax and I will reassess him in 6 weeks. He understands that if this is not reaches goal the next step would be urodynamics. We would not order this test if he did not think he would proceed with surgical treatments. Based upon the severity of his symptoms I would not be surprised that he did not pursue more aggressive options.  He understands that I do not have new treatment options for his erectile dysfunction  TodayThe patient may have had ejaculatory side effects from Flomax. He had reported that  he could live with the slower flow and that he did not want urodynamics her surgery. The beta 3 agonists cause constipation perhaps. Both Toviaz and Vesicare helped urgency and frequency but his flow became slower and he had a lot of dry mouth issues.    PMH: Past Medical History:  Diagnosis Date  . Hyperlipidemia   . Hypertension   . Personal history of tobacco use, presenting hazards to health 12/29/2015  . Stroke [facility]) 2014    Surgical History: Past Surgical History:  Procedure Laterality Date  . COLONOSCOPY  2015  . HERNIA REPAIR Right 06/09/14   inguinal   . teeth removal       Home Medications:  Allergies as of 04/01/2017   No Known Allergies     Medication List       Accurate as of 04/01/17  2:34 PM. Always use your most recent med list.          amphetamine-dextroamphetamine 30 MG tablet Commonly known as:  ADDERALL Take 1 tablet by mouth 2 (two) times daily.   aspirin EC 81 MG tablet Take 81 mg by mouth daily.   citalopram 20 MG tablet Commonly known as:  CELEXA Take 20 mg by mouth daily.   fesoterodine 8 MG Tb24 tablet Commonly known as:  TOVIAZ Take 1 tablet (8 mg total) by mouth daily.   finasteride 5 MG tablet Commonly known as:  PROSCAR Take 1 tablet (5  mg total) by mouth daily.   FISH OIL + D3 PO Take by mouth.   lisinopril 10 MG tablet Commonly known as:  PRINIVIL,ZESTRIL Take 10 mg by mouth daily.   MULTI VITAMIN MENS PO Take by mouth.   sildenafil 20 MG tablet Commonly known as:  REVATIO Take 3 to 5 tablets two hours before intercouse on an empty stomach.  Do not take with nitrates.   simvastatin 40 MG tablet Commonly known as:  ZOCOR Take 40 mg by mouth daily.   solifenacin 5 MG tablet Commonly known as:  VESICARE Take 1 tablet (5 mg total) by mouth daily.   tamsulosin 0.4 MG Caps capsule Commonly known as:  FLOMAX Take 1 capsule (0.4 mg total) by mouth daily.   vitamin C 1000 MG tablet Take 1,000 mg by mouth 2 (two)  times daily.       Allergies: No Known Allergies  Family History: Family History  Problem Relation Age of Onset  . Cancer - Other Brother        phageal  . Heart attack Father   . Colon polyps Mother   . Prostate cancer Neg Hx   . Kidney cancer Neg Hx   . Bladder Cancer Neg Hx     Social History:  reports that he has been smoking Cigarettes.  He has a 74.00 pack-year smoking history. He has never used smokeless tobacco. He reports that he drinks alcohol. He reports that he does not use drugs.  ROS: UROLOGY Frequent Urination?: Yes Hard to postpone urination?: No Burning/pain with urination?: No Get up at night to urinate?: Yes Leakage of urine?: No Urine stream starts and stops?: No Trouble starting stream?: No Do you have to strain to urinate?: No Blood in urine?: No Urinary tract infection?: No Sexually transmitted disease?: No Injury to kidneys or bladder?: No Painful intercourse?: No Weak stream?: No Erection problems?: No Penile pain?: No  Gastrointestinal Nausea?: No Vomiting?: No Indigestion/heartburn?: No Diarrhea?: No Constipation?: No  Constitutional Fever: No Night sweats?: Yes Weight loss?: No Fatigue?: No  Skin Skin rash/lesions?: No Itching?: No  Eyes Blurred vision?: No Double vision?: No  Ears/Nose/Throat Sore throat?: No Sinus problems?: No  Hematologic/Lymphatic Swollen glands?: No Easy bruising?: No  Cardiovascular Leg swelling?: No Chest pain?: No  Respiratory Cough?: No Shortness of breath?: No  Endocrine Excessive thirst?: No  Musculoskeletal Back pain?: No Joint pain?: No  Neurological Headaches?: No Dizziness?: No  Psychologic Depression?: Yes Anxiety?: No  Physical Exam: BP (!) 144/87 (BP Location: Left Arm, Patient Position: Sitting, Cuff Size: Normal)   Pulse 79   Ht 5\' 8"  (1.727 m)   Wt 156 lb 6.4 oz (70.9 kg)   BMI 23.78 kg/m   Constitutional:  Alert and oriented, No acute  distress.  Laboratory Data: Lab Results  Component Value Date   WBC 12.6 (H) 07/19/2014   HGB 16.4 07/19/2014   HCT 49.1 07/19/2014   MCV 99 07/19/2014   PLT 210 07/19/2014    Lab Results  Component Value Date   CREATININE 0.78 07/08/2013     Urinalysis    Component Value Date/Time   COLORURINE Yellow 07/08/2013 1744   APPEARANCEUR Clear 10/11/2016 1452   LABSPEC 1.014 07/08/2013 1744   PHURINE 6.0 07/08/2013 1744   GLUCOSEU Negative 10/11/2016 1452   GLUCOSEU Negative 07/08/2013 1744   HGBUR Negative 07/08/2013 1744   BILIRUBINUR Negative 10/11/2016 1452   BILIRUBINUR Negative 07/08/2013 1744   KETONESUR Negative 07/08/2013 1744   PROTEINUR  Negative 10/11/2016 1452   PROTEINUR Negative 07/08/2013 1744   NITRITE Negative 10/11/2016 1452   NITRITE Negative 07/08/2013 1744   LEUKOCYTESUR Negative 10/11/2016 1452   LEUKOCYTESUR Negative 07/08/2013 1744    Pertinent Imaging:   Assessment & Plan:  The patient has pretty much exhausted medical therapy for his lower urinary tract symptoms. They are milder but bothersome. I do not believe he is a candidate for Botox or InterStim. He does not wish to have urodynamics or prostate surgery at this stage. He was hoping a pill would work. We talked about percutaneous to ulnar stimulation and we will call him to see if his insurance will cover it.  There are no diagnoses linked to this encounter.  No Follow-up on file.  Reece Packer, MD  [facility] [address], [place], [place] [address] [phone]

## 2017-04-03 ENCOUNTER — Telehealth: Payer: Self-pay | Admitting: Family Medicine

## 2017-04-03 NOTE — Telephone Encounter (Signed)
No other  options

## 2017-04-03 NOTE — Telephone Encounter (Signed)
I spoke to the insurance company. Medicaid does not cover PTNS. He would have to pay for the treatments out of pocket. Patient was notified and he is not able to financially cover the expense. Please advise if you have any other suggestions that he may be able to try at this time.

## 2017-04-04 NOTE — Telephone Encounter (Signed)
Patient notified

## 2017-04-29 ENCOUNTER — Other Ambulatory Visit: Payer: Self-pay | Admitting: Urology

## 2017-04-29 DIAGNOSIS — N401 Enlarged prostate with lower urinary tract symptoms: Principal | ICD-10-CM

## 2017-04-29 DIAGNOSIS — N138 Other obstructive and reflux uropathy: Secondary | ICD-10-CM

## 2017-10-07 ENCOUNTER — Other Ambulatory Visit: Payer: Self-pay | Admitting: Nurse Practitioner

## 2017-10-13 ENCOUNTER — Other Ambulatory Visit: Payer: Self-pay | Admitting: Internal Medicine

## 2017-10-14 ENCOUNTER — Encounter: Payer: Self-pay | Admitting: Nurse Practitioner

## 2017-10-14 ENCOUNTER — Ambulatory Visit: Payer: Medicaid Other | Admitting: Nurse Practitioner

## 2017-10-14 VITALS — BP 128/86 | HR 73 | Resp 16 | Ht 68.0 in | Wt 159.0 lb

## 2017-10-14 DIAGNOSIS — I10 Essential (primary) hypertension: Secondary | ICD-10-CM | POA: Diagnosis not present

## 2017-10-14 DIAGNOSIS — R41840 Attention and concentration deficit: Secondary | ICD-10-CM

## 2017-10-14 DIAGNOSIS — L209 Atopic dermatitis, unspecified: Secondary | ICD-10-CM

## 2017-10-14 DIAGNOSIS — Z122 Encounter for screening for malignant neoplasm of respiratory organs: Secondary | ICD-10-CM

## 2017-10-14 DIAGNOSIS — E782 Mixed hyperlipidemia: Secondary | ICD-10-CM

## 2017-10-14 DIAGNOSIS — Z0001 Encounter for general adult medical examination with abnormal findings: Secondary | ICD-10-CM

## 2017-10-14 DIAGNOSIS — F331 Major depressive disorder, recurrent, moderate: Secondary | ICD-10-CM | POA: Diagnosis not present

## 2017-10-14 MED ORDER — TRIAMCINOLONE ACETONIDE 0.025 % EX CREA: 1.0000 "application " | TOPICAL_CREAM | Freq: Two times a day (BID) | CUTANEOUS | 1 refills | Status: DC

## 2017-10-14 MED ORDER — SIMVASTATIN 40 MG PO TABS: 40.0000 mg | ORAL_TABLET | Freq: Every day | ORAL | 6 refills | Status: DC

## 2017-10-14 MED ORDER — LISINOPRIL 10 MG PO TABS: 10.0000 mg | ORAL_TABLET | Freq: Every day | ORAL | 6 refills | Status: DC

## 2017-10-14 NOTE — Progress Notes (Signed)
uri

## 2017-10-14 NOTE — Progress Notes (Signed)
[facility] [place], [place] [address]  Internal MEDICINE  Office Visit Note  Patient Name: Alex Bowers  [id number]  [id number]  Date of Service: 10/27/2017  Chief Complaint  Patient presents with  . Rash    chest     Rash  This is a new problem. The current episode started 1 to 4 weeks ago. The problem is unchanged. The affected locations include the chest. The rash is characterized by itchiness, peeling and scaling. He was exposed to nothing. Pertinent negatives include no shortness of breath. Past treatments include anti-itch cream. The treatment provided no relief. His past medical history is significant for eczema.    Pt is here for routine follow up.    Current Medication: Outpatient Encounter Medications as of 10/14/2017  Medication Sig Note  . ADDERALL XR 15 MG 24 hr capsule TAKE 1 TABLET IN AFTERNOON   . amphetamine-dextroamphetamine (ADDERALL) 5 MG tablet Take 5 mg by mouth daily.   . Ascorbic Acid (VITAMIN C) 1000 MG tablet Take 1,000 mg by mouth 2 (two) times daily.   Marland Kitchen aspirin EC 81 MG tablet Take 81 mg by mouth daily.   . citalopram (CELEXA) 40 MG tablet Take 40 mg by mouth daily.   . finasteride (PROSCAR) 5 MG tablet TAKE 1 TABLET (5 MG TOTAL) BY MOUTH DAILY.   Marland Kitchen Fish Oil-Cholecalciferol (FISH OIL + D3 PO) Take by mouth.   Marland Kitchen lisinopril (PRINIVIL,ZESTRIL) 10 MG tablet Take 1 tablet (10 mg total) by mouth daily.   . Multiple Vitamin (MULTI VITAMIN MENS PO) Take by mouth.   . sildenafil (REVATIO) 20 MG tablet Take 3 to 5 tablets two hours before intercouse on an empty stomach.  Do not take with nitrates.   . simvastatin (ZOCOR) 40 MG tablet Take 1 tablet (40 mg total) by mouth daily at 6 PM.   . [DISCONTINUED] lisinopril (PRINIVIL,ZESTRIL) 10 MG tablet Take 10 mg by mouth daily.   . [DISCONTINUED] simvastatin (ZOCOR) 40 MG tablet TAKE 1 DAILY   . triamcinolone (KENALOG) 0.025 % cream Apply 1 application topically 2 (two) times  daily.   . [DISCONTINUED] amphetamine-dextroamphetamine (ADDERALL) 30 MG tablet Take 1 tablet by mouth 2 (two) times daily.   . [DISCONTINUED] citalopram (CELEXA) 20 MG tablet Take 20 mg by mouth daily. 06/16/2015: Received from: External Pharmacy  . [DISCONTINUED] fesoterodine (TOVIAZ) 8 MG TB24 tablet Take 1 tablet (8 mg total) by mouth daily. (Patient not taking: Reported on 04/01/2017)   . [DISCONTINUED] solifenacin (VESICARE) 5 MG tablet Take 1 tablet (5 mg total) by mouth daily. (Patient not taking: Reported on 04/01/2017)   . [DISCONTINUED] tamsulosin (FLOMAX) 0.4 MG CAPS capsule Take 1 capsule (0.4 mg total) by mouth daily. (Patient not taking: Reported on 04/01/2017)    No facility-administered encounter medications on file as of 10/14/2017.     Surgical History: Past Surgical History:  Procedure Laterality Date  . COLONOSCOPY  2015  . HERNIA REPAIR Right 06/09/14   inguinal   . teeth removal       Medical History: Past Medical History:  Diagnosis Date  . Hyperlipidemia   . Hypertension   . Personal history of tobacco use, presenting hazards to health 12/29/2015  . Stroke [facility]) 2014    Family History: Family History  Problem Relation Age of Onset  . Cancer - Other Brother        phageal  . Heart attack Father   . Colon polyps Mother   .  Prostate cancer Neg Hx   . Kidney cancer Neg Hx   . Bladder Cancer Neg Hx     Social History   Socioeconomic History  . Marital status: Divorced    Spouse name: Not on file  . Number of children: Not on file  . Years of education: Not on file  . Highest education level: Not on file  Social Needs  . Financial resource strain: Not on file  . Food insecurity - worry: Not on file  . Food insecurity - inability: Not on file  . Transportation needs - medical: Not on file  . Transportation needs - non-medical: Not on file  Occupational History  . Not on file  Tobacco Use  . Smoking status: Current Every Day Smoker    Packs/day:  2.00    Years: 37.00    Pack years: 74.00    Types: Cigarettes  . Smokeless tobacco: Never Used  Substance and Sexual Activity  . Alcohol use: Yes    Comment: occ  . Drug use: No  . Sexual activity: Not on file  Other Topics Concern  . Not on file  Social History Narrative  . Not on file      Review of Systems  Constitutional: Negative.   HENT: Negative.   Respiratory: Negative for shortness of breath and wheezing.        Long history of smoking   Cardiovascular:       Elevated blood pressure today.  Gastrointestinal: Negative.   Endocrine: Negative.   Genitourinary: Positive for frequency.       Generally sees urology for flow'bph issues  Musculoskeletal: Negative.   Skin: Positive for rash.  Allergic/Immunologic: Negative for environmental allergies, food allergies and immunocompromised state.  Neurological: Negative.  Syncope: chest ct.  Psychiatric/Behavioral: Negative.     Today's Vitals   10/14/17 1537 10/14/17 1627  BP: (!) 149/94 128/86  Pulse: 73   Resp: 16   SpO2: 97%   Weight: 159 lb (72.1 kg)   Height: 5\' 8"  (1.727 m)      Physical Exam  Constitutional: He is oriented to person, place, and time. He appears well-developed and well-nourished. No distress.  HENT:  Head: Normocephalic and atraumatic.  Mouth/Throat: Oropharynx is clear and moist. No oropharyngeal exudate.  Eyes: EOM are normal. Pupils are equal, round, and reactive to light.  Neck: Normal range of motion. Neck supple. No JVD present. Carotid bruit is not present. No tracheal deviation present. No thyromegaly present.  Soft bruit on right side of the neck.   Cardiovascular: Normal rate, regular rhythm and normal heart sounds. Exam reveals no gallop and no friction rub.  No murmur heard. Pulmonary/Chest: Effort normal and breath sounds normal. No respiratory distress. He has no wheezes. He has no rales. He exhibits no tenderness.  Abdominal: Soft. Bowel sounds are normal. There is no  tenderness.  Musculoskeletal: Normal range of motion.  Lymphadenopathy:    He has no cervical adenopathy.  Neurological: He is alert and oriented to person, place, and time. No cranial nerve deficit.  Skin: Skin is warm and dry. Rash noted. He is not diaphoretic.  Fine, red, raised rash, on the chest, arms, neck, and abdomen. Irritation present without evidence of infection.  Psychiatric: He has a normal mood and affect. His behavior is normal. Judgment and thought content normal.  Nursing note and vitals reviewed.   Assessment/Plan: 1. Encounter for general adult medical examination with abnormal findings Annual wellness visit today - Urinalysis, Routine  w reflex microscopic  2. Atopic dermatitis and related condition - triamcinolone (KENALOG) 0.025 % cream; Apply 1 application topically 2 (two) times daily.  Dispense: 80 g; Refill: 1  3. Essential hypertension Stable on current medication. - lisinopril (PRINIVIL,ZESTRIL) 10 MG tablet; Take 1 tablet (10 mg total) by mouth daily.  Dispense: 30 tablet; Refill: 6  4. Mixed hyperlipidemia Cholesterol panel stable.  - simvastatin (ZOCOR) 40 MG tablet; Take 1 tablet (40 mg total) by mouth daily at 6 PM.  Dispense: 30 tablet; Refill: 6  5. Encounter for screening for malignant neoplasm of respiratory organs Long history of smoking. - CT CHEST LUNG CA SCREEN LOW DOSE W/O CM; Future  General Counseling: Deovion verbalizes understanding of the findings of todays visit and agrees with plan of treatment. I have discussed any further diagnostic evaluation that may be needed or ordered today. We also reviewed his medications today. he has been encouraged to call the office with any questions or concerns that should arise related to todays visit.   This patient was seen by Leretha Pol, FNP- C in Collaboration with Dr [name] as a part of collaborative care agreement    Orders Placed This Encounter  Procedures  . Microscopic  Examination  . CT CHEST LUNG CA SCREEN LOW DOSE W/O CM  . Urinalysis, Routine w reflex microscopic      Time spent: 27 Minutes    Dr [name] Internal medicine

## 2017-10-15 LAB — MICROSCOPIC EXAMINATION
BACTERIA UA: NONE SEEN
CASTS: NONE SEEN /LPF
EPITHELIAL CELLS (NON RENAL): NONE SEEN /HPF (ref 0–10)

## 2017-10-15 LAB — URINALYSIS, ROUTINE W REFLEX MICROSCOPIC
BILIRUBIN UA: NEGATIVE
Glucose, UA: NEGATIVE
NITRITE UA: NEGATIVE
PH UA: 5 (ref 5.0–7.5)
Protein, UA: NEGATIVE
RBC UA: NEGATIVE
SPEC GRAV UA: 1.021 (ref 1.005–1.030)
Urobilinogen, Ur: 0.2 mg/dL (ref 0.2–1.0)

## 2017-10-16 ENCOUNTER — Telehealth: Payer: Self-pay

## 2017-10-16 NOTE — Telephone Encounter (Signed)
lmom to  Pt about labs where he did at [place] or [facility]

## 2017-10-17 LAB — CBC
HEMATOCRIT: 45.5 % (ref 37.5–51.0)
HEMOGLOBIN: 16.1 g/dL (ref 13.0–17.7)
MCH: 34.5 pg — AB (ref 26.6–33.0)
MCHC: 35.4 g/dL (ref 31.5–35.7)
MCV: 98 fL — ABNORMAL HIGH (ref 79–97)
Platelets: 245 10*3/uL (ref 150–379)
RBC: 4.66 x10E6/uL (ref 4.14–5.80)
RDW: 13.1 % (ref 12.3–15.4)
WBC: 10.6 10*3/uL (ref 3.4–10.8)

## 2017-10-17 LAB — FERRITIN: FERRITIN: 153 ng/mL (ref 30–400)

## 2017-10-17 LAB — VITAMIN D 25 HYDROXY (VIT D DEFICIENCY, FRACTURES): Vit D, 25-Hydroxy: 76.7 ng/mL (ref 30.0–100.0)

## 2017-10-17 LAB — IRON AND TIBC
IRON SATURATION: 27 % (ref 15–55)
IRON: 92 ug/dL (ref 38–169)
TIBC: 342 ug/dL (ref 250–450)
UIBC: 250 ug/dL (ref 111–343)

## 2017-10-17 LAB — COMPREHENSIVE METABOLIC PANEL
ALT: 17 IU/L (ref 0–44)
AST: 20 IU/L (ref 0–40)
Albumin/Globulin Ratio: 2.2 (ref 1.2–2.2)
Albumin: 4.9 g/dL — ABNORMAL HIGH (ref 3.6–4.8)
Alkaline Phosphatase: 69 IU/L (ref 39–117)
BUN/Creatinine Ratio: 11 (ref 10–24)
BUN: 9 mg/dL (ref 8–27)
Bilirubin Total: 0.5 mg/dL (ref 0.0–1.2)
CALCIUM: 9.8 mg/dL (ref 8.6–10.2)
CO2: 24 mmol/L (ref 20–29)
CREATININE: 0.82 mg/dL (ref 0.76–1.27)
Chloride: 99 mmol/L (ref 96–106)
GFR calc non Af Amer: 96 mL/min/{1.73_m2} (ref >59)
GFR, EST AFRICAN AMERICAN: 111 mL/min/{1.73_m2} (ref >59)
Globulin, Total: 2.2 g/dL (ref 1.5–4.5)
Glucose: 104 mg/dL — ABNORMAL HIGH (ref 65–99)
Potassium: 4.7 mmol/L (ref 3.5–5.2)
Sodium: 138 mmol/L (ref 134–144)
TOTAL PROTEIN: 7.1 g/dL (ref 6.0–8.5)

## 2017-10-17 LAB — T3 UPTAKE: T3 Uptake Ratio: 28 % (ref 24–39)

## 2017-10-17 LAB — LIPID PANEL W/O CHOL/HDL RATIO
Cholesterol, Total: 148 mg/dL (ref 100–199)
HDL: 56 mg/dL (ref >39)
LDL CALC: 83 mg/dL (ref 0–99)
Triglycerides: 47 mg/dL (ref 0–149)
VLDL CHOLESTEROL CAL: 9 mg/dL (ref 5–40)

## 2017-10-17 LAB — HGB A1C W/O EAG: HEMOGLOBIN A1C: 5.5 % (ref 4.8–5.6)

## 2017-10-17 LAB — T4, FREE: FREE T4: 1.3 ng/dL (ref 0.82–1.77)

## 2017-10-17 LAB — B12 AND FOLATE PANEL
Folate: >20 ng/mL (ref >3.0)
Vitamin B-12: 1643 pg/mL — ABNORMAL HIGH (ref 232–1245)

## 2017-10-23 ENCOUNTER — Telehealth: Payer: Self-pay

## 2017-10-23 NOTE — Telephone Encounter (Signed)
lmom pt all labs are normal

## 2017-10-27 DIAGNOSIS — I10 Essential (primary) hypertension: Secondary | ICD-10-CM | POA: Insufficient documentation

## 2017-10-27 DIAGNOSIS — L209 Atopic dermatitis, unspecified: Secondary | ICD-10-CM | POA: Insufficient documentation

## 2017-10-27 DIAGNOSIS — E782 Mixed hyperlipidemia: Secondary | ICD-10-CM | POA: Insufficient documentation

## 2017-10-27 DIAGNOSIS — F331 Major depressive disorder, recurrent, moderate: Secondary | ICD-10-CM | POA: Insufficient documentation

## 2017-10-27 DIAGNOSIS — R41840 Attention and concentration deficit: Secondary | ICD-10-CM | POA: Insufficient documentation

## 2018-01-06 ENCOUNTER — Other Ambulatory Visit: Payer: Self-pay | Admitting: Urology

## 2018-01-11 ENCOUNTER — Other Ambulatory Visit: Payer: Self-pay | Admitting: Urology

## 2018-01-11 DIAGNOSIS — N138 Other obstructive and reflux uropathy: Secondary | ICD-10-CM

## 2018-01-11 DIAGNOSIS — N401 Enlarged prostate with lower urinary tract symptoms: Principal | ICD-10-CM

## 2018-01-26 ENCOUNTER — Other Ambulatory Visit: Payer: Self-pay | Admitting: Urology

## 2018-01-26 DIAGNOSIS — N401 Enlarged prostate with lower urinary tract symptoms: Principal | ICD-10-CM

## 2018-01-26 DIAGNOSIS — N138 Other obstructive and reflux uropathy: Secondary | ICD-10-CM

## 2018-01-29 NOTE — Progress Notes (Signed)
01/30/2018 2:20 PM   Alex Bowers November 15, 1955 [id number]  Referring provider: [name], [place], [place] [id number]  Chief Complaint  Patient presents with  . Urinary Urgency    HPI: 62 yo WM with a history of urgency, BPH with LU TS, ED and history of hematuria who presents today for follow up.    Urgency He could not tolerate Myrbetriq due to diarrhea.  He could not tolerate Toviaz 8mg  due to constipation.  His insurance would not cover the PTNS.  He was not a good candidate for Botox or Interstim.    BPH WITH LUTS  (prostate and/or bladder) IPSS score: 12/3        Previous score: 12/3  Previous PVR: 0 mL  Major complaint(s):  Urgency x several months. Denies any dysuria, hematuria or suprapubic pain.   Currently taking: finasteride 5 mg daily.    His has had cystoscopy in 2017 he was found to have impressive intraluminal bilobar hypertrophy.     Denies any recent fevers, chills, nausea or vomiting.  He does not have a family history of PCa.  IPSS    Row Name 01/30/18 1300         International Prostate Symptom Score   How often have you had the sensation of not emptying your bladder?  Less than half the time     How often have you had to urinate less than every two hours?  Less than half the time     How often have you found you stopped and started again several times when you urinated?  Less than half the time     How often have you found it difficult to postpone urination?  Less than half the time     How often have you had a weak urinary stream?  Less than 1 in 5 times     How often have you had to strain to start urination?  Less than 1 in 5 times     How many times did you typically get up at night to urinate?  2 Times     Total IPSS Score  12       Quality of Life due to urinary symptoms   If you were to spend the rest of your life with your urinary condition just the way it is now how would you feel about that?  Mixed         Score:  1-7 Mild 8-19 Moderate 20-35 Severe   Erectile dysfunction His SHIM score is 10, which is moderate ED.   His previous SHIM score was 14.  He has been having difficulty with erections for several years.   His major complaint is achieving an erections.  His libido is preserved.   His risk factors for ED are age, BPH, HTN, smoking and blood pressure medications.  He denies any painful erections or curvatures with his erections.  He states that the sildenafil 20 mg, three tablets, is mostly effective.   [place] Name 01/30/18 1348         SHIM: Over the last 6 months:   How do you rate your confidence that you could get and keep an erection?  Low     When you had erections with sexual stimulation, how often were your erections hard enough for penetration (entering your partner)?  A Few Times (much less than half the time)     During sexual intercourse, how  often were you able to maintain your erection after you had penetrated (entered) your partner?  A Few Times (much less than half the time)     During sexual intercourse, how difficult was it to maintain your erection to completion of intercourse?  Very Difficult     When you attempted sexual intercourse, how often was it satisfactory for you?  A Few Times (much less than half the time)       SHIM Total Score   SHIM  10        Score: 1-7 Severe ED 8-11 Moderate ED 12-16 Mild-Moderate ED 17-21 Mild ED 22-25 No ED  History of hematuria  He completed a hematuria workup in 2017 and was found to have impressive intraluminal bilobar hypertrophy.  He has not seen any gross hematuria.  His UA is negative.    PMH: Past Medical History:  Diagnosis Date  . Hyperlipidemia   . Hypertension   . Personal history of tobacco use, presenting hazards to health 12/29/2015  . Stroke [facility] 2014    Surgical History: Past Surgical History:  Procedure Laterality Date  . COLONOSCOPY  2015  . HERNIA REPAIR Right 06/09/14    inguinal   . teeth removal       Home Medications:  Allergies as of 01/30/2018   No Known Allergies     Medication List        Accurate as of 01/30/18  2:20 PM. Always use your most recent med list.          amphetamine-dextroamphetamine 5 MG tablet Commonly known as:  ADDERALL Take 5 mg by mouth daily.   ADDERALL XR 15 MG 24 hr capsule Generic drug:  amphetamine-dextroamphetamine TAKE 1 TABLET IN AFTERNOON   aspirin EC 81 MG tablet Take 81 mg by mouth daily.   citalopram 40 MG tablet Commonly known as:  CELEXA Take 40 mg by mouth daily.   finasteride 5 MG tablet Commonly known as:  PROSCAR Take 1 tablet (5 mg total) by mouth daily.   FISH OIL + D3 PO Take by mouth.   lisinopril 10 MG tablet Commonly known as:  PRINIVIL,ZESTRIL Take 1 tablet (10 mg total) by mouth daily.   MULTI VITAMIN MENS PO Take by mouth.   sildenafil 20 MG tablet Commonly known as:  REVATIO Take 3 to 5 tablets two hours before intercouse on an empty stomach.  Do not take with nitrates.   simvastatin 40 MG tablet Commonly known as:  ZOCOR Take 1 tablet (40 mg total) by mouth daily at 6 PM.   triamcinolone 0.025 % cream Commonly known as:  KENALOG Apply 1 application topically 2 (two) times daily.   vitamin C 1000 MG tablet Take 1,000 mg by mouth 2 (two) times daily.       Allergies: No Known Allergies  Family History: Family History  Problem Relation Age of Onset  . Cancer - Other Brother        phageal  . Heart attack Father   . Colon polyps Mother   . Prostate cancer Neg Hx   . Kidney cancer Neg Hx   . Bladder Cancer Neg Hx     Social History:  reports that he has been smoking cigarettes.  He has a 74.00 pack-year smoking history. He has never used smokeless tobacco. He reports that he drinks alcohol. He reports that he does not use drugs.  ROS: UROLOGY Frequent Urination?: No Hard to postpone urination?: Yes Burning/pain with urination?: No Get  up at night to  urinate?: No Leakage of urine?: No Urine stream starts and stops?: No Trouble starting stream?: No Do you have to strain to urinate?: No Blood in urine?: No Urinary tract infection?: No Sexually transmitted disease?: No Injury to kidneys or bladder?: No Painful intercourse?: No Weak stream?: No Erection problems?: Yes Penile pain?: No  Gastrointestinal Nausea?: No Vomiting?: No Indigestion/heartburn?: No Diarrhea?: No Constipation?: No  Constitutional Fever: No Night sweats?: No Weight loss?: No Fatigue?: No  Skin Skin rash/lesions?: No Itching?: No  Eyes Blurred vision?: No Double vision?: No  Ears/Nose/Throat Sore throat?: No Sinus problems?: No  Hematologic/Lymphatic Swollen glands?: No Easy bruising?: No  Cardiovascular Leg swelling?: No Chest pain?: No  Respiratory Cough?: No Shortness of breath?: No  Endocrine Excessive thirst?: No  Musculoskeletal Back pain?: No Joint pain?: No  Neurological Headaches?: No Dizziness?: No  Psychologic Depression?: Yes Anxiety?: No  Physical Exam: BP 135/85 (BP Location: Right Arm, Patient Position: Sitting, Cuff Size: Normal)   Pulse 68   Ht 5\' 8"  (1.727 m)   Wt 155 lb 3.2 oz (70.4 kg)   BMI 23.60 kg/m   Constitutional: Well nourished. Alert and oriented, No acute distress. HEENT: [place] AT, moist mucus membranes. Trachea midline, no masses. Cardiovascular: No clubbing, cyanosis, or edema. Respiratory: Normal respiratory effort, no increased work of breathing. GI: Abdomen is soft, non tender, non distended, no abdominal masses. Liver and spleen not palpable.  Left reducible inguinal hernia.  No right hernias appreciated.  Stool sample for occult testing is not indicated.   GU: No CVA tenderness.  No bladder fullness or masses.  Patient with circumcised phallus.  Urethral meatus is patent.  No penile discharge. No penile lesions or rashes. Scrotum without lesions, cysts, rashes and/or edema.  Testicles  are located scrotally bilaterally. No masses are appreciated in the testicles. Left and right epididymis are normal. Rectal: Patient with  normal sphincter tone. Anus and perineum without scarring or rashes. No rectal masses are appreciated. Prostate is approximately 55 grams, no nodules are appreciated. Seminal vesicles are normal. Skin: No rashes, bruises or suspicious lesions. Lymph: No cervical or inguinal adenopathy. Neurologic: Grossly intact, no focal deficits, moving all 4 extremities. Psychiatric: Normal mood and affect.   Laboratory Data: PSA History  1.0 ng/mL on 06/07/2015 - started on finasteride  0.6 ng/mL on 10/11/2016 (1.2)  Lab Results  Component Value Date   WBC 10.6 10/07/2017   HGB 16.1 10/07/2017   HCT 45.5 10/07/2017   MCV 98 (H) 10/07/2017   PLT 245 10/07/2017    Lab Results  Component Value Date   CREATININE 0.82 10/07/2017       Component Value Date/Time   CHOL 148 10/07/2017 1607   CHOL 233 (H) 07/09/2013 0407   HDL 56 10/07/2017 1607   HDL 35 (L) 07/09/2013 0407   VLDL 19 07/09/2013 0407   LDLCALC 83 10/07/2017 1607   LDLCALC 179 (H) 07/09/2013 0407    Lab Results  Component Value Date   AST 20 10/07/2017   Lab Results  Component Value Date   ALT 17 10/07/2017   I have reviewed the labs.  Assessment & Plan:    1. Urgency Failed Myrbetriq - ADR of diarrhea and failed Toviaz 8 mg daily - ADR of constipation Discussed PT and PTNS - insurance will not cover  Not a candidate for Botox or Interstim  2. BPH with LU TS  - I PSS 12/3, it is stable  - Continue conservative  management, avoiding bladder irritants and timed voiding's  - urgency is most bothersome symptom   - Continue finasteride 5 mg daily  - RTC in 6 months for I PSS, PSA and exam   3. Erectile dysfunction  SHIM score is 10, it is worsening Found no benefit with testosterone therapy Refill sildenafil 20 mg, 3 to 5 tablets prior to intercourse   4. Microscopic hematuria    - completed hematuria workup 08/2015 - likely from BPH as he is having hematospermia   - continue finasteride  - UA is negative  - check UA yearly  Return in about 6 months (around 08/01/2018) for IPSS, SHIM, PSA , UA and exam.  These notes generated with voice recognition software. I apologize for typographical errors.  Zara Council, PA-C  [facility] [address] [place], [place] [address] [phone]

## 2018-01-30 ENCOUNTER — Ambulatory Visit (INDEPENDENT_AMBULATORY_CARE_PROVIDER_SITE_OTHER): Payer: Medicaid Other | Admitting: Urology

## 2018-01-30 ENCOUNTER — Encounter: Payer: Self-pay | Admitting: Urology

## 2018-01-30 VITALS — BP 135/85 | HR 68 | Ht 68.0 in | Wt 155.2 lb

## 2018-01-30 DIAGNOSIS — N529 Male erectile dysfunction, unspecified: Secondary | ICD-10-CM

## 2018-01-30 DIAGNOSIS — N3941 Urge incontinence: Secondary | ICD-10-CM | POA: Diagnosis not present

## 2018-01-30 DIAGNOSIS — N138 Other obstructive and reflux uropathy: Secondary | ICD-10-CM

## 2018-01-30 DIAGNOSIS — N401 Enlarged prostate with lower urinary tract symptoms: Secondary | ICD-10-CM | POA: Diagnosis not present

## 2018-01-30 DIAGNOSIS — Z87448 Personal history of other diseases of urinary system: Secondary | ICD-10-CM

## 2018-01-30 LAB — URINALYSIS, COMPLETE
Bilirubin, UA: NEGATIVE
GLUCOSE, UA: NEGATIVE
Ketones, UA: NEGATIVE
Nitrite, UA: NEGATIVE
PROTEIN UA: NEGATIVE
RBC, UA: NEGATIVE
Specific Gravity, UA: 1.02 (ref 1.005–1.030)
Urobilinogen, Ur: 0.2 mg/dL (ref 0.2–1.0)
pH, UA: 5 (ref 5.0–7.5)

## 2018-01-30 LAB — MICROSCOPIC EXAMINATION
Bacteria, UA: NONE SEEN
Epithelial Cells (non renal): NONE SEEN /hpf (ref 0–10)
RBC MICROSCOPIC, UA: NONE SEEN /HPF (ref 0–2)

## 2018-01-30 MED ORDER — SILDENAFIL CITRATE 20 MG PO TABS: ORAL_TABLET | ORAL | 3 refills | Status: DC

## 2018-01-30 MED ORDER — FINASTERIDE 5 MG PO TABS: 5.0000 mg | ORAL_TABLET | Freq: Every day | ORAL | 3 refills | Status: DC

## 2018-01-31 ENCOUNTER — Other Ambulatory Visit: Payer: Self-pay | Admitting: Urology

## 2018-01-31 ENCOUNTER — Telehealth: Payer: Self-pay

## 2018-01-31 DIAGNOSIS — N138 Other obstructive and reflux uropathy: Secondary | ICD-10-CM

## 2018-01-31 DIAGNOSIS — N401 Enlarged prostate with lower urinary tract symptoms: Principal | ICD-10-CM

## 2018-01-31 LAB — PSA: Prostate Specific Ag, Serum: 0.7 ng/mL (ref 0.0–4.0)

## 2018-01-31 NOTE — Progress Notes (Signed)
PSA order in for 6 month follow up.

## 2018-01-31 NOTE — Telephone Encounter (Signed)
-----   Message from Nori Riis, PA-C sent at 01/31/2018  7:41 AM EDT ----- Please let [name] know that his PSA is stable at 0.7.   We will see him in 6 months.  PSA to be drawn before his next appointment.

## 2018-01-31 NOTE — Telephone Encounter (Signed)
Left detailed message.   

## 2018-02-05 ENCOUNTER — Other Ambulatory Visit: Payer: Self-pay | Admitting: Nurse Practitioner

## 2018-02-05 ENCOUNTER — Telehealth: Payer: Self-pay | Admitting: *Deleted

## 2018-02-05 DIAGNOSIS — Z87891 Personal history of nicotine dependence: Secondary | ICD-10-CM

## 2018-02-05 DIAGNOSIS — Z122 Encounter for screening for malignant neoplasm of respiratory organs: Secondary | ICD-10-CM

## 2018-02-05 NOTE — Telephone Encounter (Signed)
Patient is scheduled for lung screening scan. Confirmed that patient is within the age range of 55-77, and asymptomatic, (no signs or symptoms of lung cancer). Patient denies illness that would prevent curative treatment for lung cancer if found. Verified smoking history, (current, 48 pack year). The shared decision making visit was done 06/23/14. Patient is agreeable for CT scan being scheduled.

## 2018-02-07 ENCOUNTER — Ambulatory Visit
Admission: RE | Admit: 2018-02-07 | Discharge: 2018-02-07 | Disposition: A | Discharge status: Home / Self Care | Payer: Medicaid Other | Source: Ambulatory Visit | Attending: Nurse Practitioner | Admitting: Nurse Practitioner

## 2018-02-07 DIAGNOSIS — I700 Atherosclerosis of aorta: Secondary | ICD-10-CM | POA: Diagnosis not present

## 2018-02-07 DIAGNOSIS — Z122 Encounter for screening for malignant neoplasm of respiratory organs: Secondary | ICD-10-CM | POA: Diagnosis not present

## 2018-02-07 DIAGNOSIS — J439 Emphysema, unspecified: Secondary | ICD-10-CM | POA: Insufficient documentation

## 2018-02-07 DIAGNOSIS — I2510 Atherosclerotic heart disease of native coronary artery without angina pectoris: Secondary | ICD-10-CM | POA: Diagnosis not present

## 2018-02-07 DIAGNOSIS — Z87891 Personal history of nicotine dependence: Secondary | ICD-10-CM | POA: Diagnosis not present

## 2018-02-10 ENCOUNTER — Encounter: Payer: Self-pay | Admitting: *Deleted

## 2018-04-14 ENCOUNTER — Ambulatory Visit: Payer: Medicaid Other | Admitting: Nurse Practitioner

## 2018-04-14 ENCOUNTER — Encounter: Payer: Self-pay | Admitting: Nurse Practitioner

## 2018-04-14 VITALS — BP 138/82 | HR 58 | Resp 16 | Ht 68.0 in | Wt 154.4 lb

## 2018-04-14 DIAGNOSIS — E782 Mixed hyperlipidemia: Secondary | ICD-10-CM | POA: Diagnosis not present

## 2018-04-14 DIAGNOSIS — I10 Essential (primary) hypertension: Secondary | ICD-10-CM | POA: Diagnosis not present

## 2018-04-14 DIAGNOSIS — I700 Atherosclerosis of aorta: Secondary | ICD-10-CM | POA: Diagnosis not present

## 2018-04-14 DIAGNOSIS — H547 Unspecified visual loss: Secondary | ICD-10-CM | POA: Diagnosis not present

## 2018-04-14 NOTE — Progress Notes (Signed)
[facility] [place], [place] [address]  Internal MEDICINE  Office Visit Note  Patient Name: Alex Bowers  [id number]  [id number]  Date of Service: 04/14/2018  Chief Complaint  Patient presents with  . Hypertension    6 month follow up  . Hyperlipidemia  . Depression    Hypertension  This is a chronic problem. The current episode started more than 1 year ago. The problem is unchanged. The problem is resistant. Associated symptoms include orthopnea and shortness of breath. Pertinent negatives include no chest pain, headaches or palpitations. Agents associated with hypertension include amphetamines. Risk factors for coronary artery disease include dyslipidemia, male gender and stress. Past treatments include ACE inhibitors. The current treatment provides moderate improvement. Hypertensive end-organ damage includes CVA.       Current Medication: Outpatient Encounter Medications as of 04/14/2018  Medication Sig  . ADDERALL XR 15 MG 24 hr capsule TAKE 1 TABLET IN AFTERNOON  . amphetamine-dextroamphetamine (ADDERALL) 5 MG tablet Take 5 mg by mouth daily.  . Ascorbic Acid (VITAMIN C) 1000 MG tablet Take 1,000 mg by mouth 2 (two) times daily.  Marland Kitchen aspirin EC 81 MG tablet Take 81 mg by mouth daily.  . citalopram (CELEXA) 40 MG tablet Take 20 mg by mouth daily.   . finasteride (PROSCAR) 5 MG tablet Take 1 tablet (5 mg total) by mouth daily.  . Fish Oil-Cholecalciferol (FISH OIL + D3 PO) Take by mouth.  Marland Kitchen lisinopril (PRINIVIL,ZESTRIL) 10 MG tablet Take 1 tablet (10 mg total) by mouth daily.  . Multiple Vitamin (MULTI VITAMIN MENS PO) Take by mouth.  . sildenafil (REVATIO) 20 MG tablet Take 3 to 5 tablets two hours before intercouse on an empty stomach.  Do not take with nitrates.  . simvastatin (ZOCOR) 40 MG tablet Take 1 tablet (40 mg total) by mouth daily at 6 PM.  . triamcinolone (KENALOG) 0.025 % cream Apply 1 application topically 2 (two) times daily.    No facility-administered encounter medications on file as of 04/14/2018.     Surgical History: Past Surgical History:  Procedure Laterality Date  . COLONOSCOPY  2015  . HERNIA REPAIR Right 06/09/14   inguinal   . teeth removal       Medical History: Past Medical History:  Diagnosis Date  . Hyperlipidemia   . Hypertension   . Personal history of tobacco use, presenting hazards to health 12/29/2015  . Stroke [facility]) 2014    Family History: Family History  Problem Relation Age of Onset  . Cancer - Other Brother        phageal  . Heart attack Father   . Colon polyps Mother   . Prostate cancer Neg Hx   . Kidney cancer Neg Hx   . Bladder Cancer Neg Hx     Social History   Socioeconomic History  . Marital status: Divorced    Spouse name: Not on file  . Number of children: Not on file  . Years of education: Not on file  . Highest education level: Not on file  Occupational History  . Not on file  Social Needs  . Financial resource strain: Not on file  . Food insecurity:    Worry: Not on file    Inability: Not on file  . Transportation needs:    Medical: Not on file    Non-medical: Not on file  Tobacco Use  . Smoking status: Current Every Day Smoker    Packs/day: 2.00  Years: 37.00    Pack years: 74.00    Types: Cigarettes  . Smokeless tobacco: Never Used  Substance and Sexual Activity  . Alcohol use: Yes    Comment: occ  . Drug use: No  . Sexual activity: Not on file  Lifestyle  . Physical activity:    Days per week: Not on file    Minutes per session: Not on file  . Stress: Not on file  Relationships  . Social connections:    Talks on phone: Not on file    Gets together: Not on file    Attends religious service: Not on file    Active member of club or organization: Not on file    Attends meetings of clubs or organizations: Not on file    Relationship status: Not on file  . Intimate partner violence:    Fear of current or ex partner: Not on file     Emotionally abused: Not on file    Physically abused: Not on file    Forced sexual activity: Not on file  Other Topics Concern  . Not on file  Social History Narrative  . Not on file      Review of Systems  Constitutional: Negative for activity change, chills, fatigue, fever and unexpected weight change.  HENT: Negative for congestion, postnasal drip, rhinorrhea, sneezing and sore throat.   Eyes: Negative.  Negative for redness.  Respiratory: Positive for shortness of breath and wheezing. Negative for cough and chest tightness.        Long history of smoking   Cardiovascular: Positive for orthopnea. Negative for chest pain and palpitations.       Elevated blood pressure today.  Gastrointestinal: Negative for constipation, diarrhea, nausea and vomiting.  Endocrine: Negative for cold intolerance, heat intolerance, polydipsia, polyphagia and polyuria.  Genitourinary: Positive for frequency. Negative for dysuria.       Generally sees urology for flow'bph issues  Musculoskeletal: Negative for arthralgias and myalgias.  Skin: Negative for rash.  Allergic/Immunologic: Negative for environmental allergies, food allergies and immunocompromised state.  Neurological: Negative for dizziness and headaches. Syncope: chest ct.  Hematological: Negative for adenopathy.  Psychiatric/Behavioral: Positive for decreased concentration and dysphoric mood. The patient is nervous/anxious.        Sees psychiatry and medication management    Today's Vitals   04/14/18 1532  BP: 138/82  Pulse: (!) 58  Resp: 16  SpO2: 95%  Weight: 154 lb 6.4 oz (70 kg)  Height: 5\' 8"  (1.727 m)   Physical Exam  Constitutional: He is oriented to person, place, and time. He appears well-developed and well-nourished. No distress.  HENT:  Head: Normocephalic and atraumatic.  Mouth/Throat: Oropharynx is clear and moist. No oropharyngeal exudate.  Eyes: Pupils are equal, round, and reactive to light. EOM are normal.   Neck: Normal range of motion. Neck supple. No JVD present. Carotid bruit is not present. No tracheal deviation present. No thyromegaly present.  Soft bruit on right side of the neck.   Cardiovascular: Normal rate, regular rhythm and normal heart sounds. Exam reveals no gallop and no friction rub.  No murmur heard. Pulmonary/Chest: Effort normal and breath sounds normal. No respiratory distress. He has no wheezes. He has no rales. He exhibits no tenderness.  Abdominal: Soft. Bowel sounds are normal. There is no tenderness.  Musculoskeletal: Normal range of motion.  Lymphadenopathy:    He has no cervical adenopathy.  Neurological: He is alert and oriented to person, place, and time. No cranial  nerve deficit.  Skin: Skin is warm and dry. No rash noted. He is not diaphoretic.  Psychiatric: His speech is normal and behavior is normal. Judgment and thought content normal. His mood appears anxious. Cognition and memory are normal. He exhibits a depressed mood.  Nursing note and vitals reviewed.  Assessment/Plan: 1. Essential hypertension Stable. Continue bp medication as prescribed.   2. Mixed hyperlipidemia Stable. Continue simvastatin as prescribed   3. Atherosclerosis of aorta ([place]) aoritc atherosclerosis seen on chest CT. Will get carotid doppler study for further evaluation.  - US Carotid Duplex Bilateral; Future  4. Decreased visual acuity Refer to  Eye center for further evaluation.  - Ambulatory referral to Ophthalmology  General Counseling: Hardin Negus understanding of the findings of todays visit and agrees with plan of treatment. I have discussed any further diagnostic evaluation that may be needed or ordered today. We also reviewed his medications today. he has been encouraged to call the office with any questions or concerns that should arise related to todays visit.  Hypertension Counseling:   The following hypertensive lifestyle modification were  recommended and discussed:  1. Limiting alcohol intake to less than 1 oz/day of ethanol:(24 oz of beer or 8 oz of wine or 2 oz of 100-proof whiskey). 2. Take baby ASA 81 mg daily. 3. Importance of regular aerobic exercise and losing weight. 4. Reduce dietary saturated fat and cholesterol intake for overall cardiovascular health. 5. Maintaining adequate dietary potassium, calcium, and magnesium intake. 6. Regular monitoring of the blood pressure. 7. Reduce sodium intake to less than 100 mmol/day (less than 2.3 gm of sodium or less than 6 gm of sodium choride)   This patient was seen by [facility] [place] with Dr [name] as a part of collaborative care agreement  Orders Placed This Encounter  Procedures  . US Carotid Duplex Bilateral  . Ambulatory referral to Ophthalmology     Time spent: [facility]

## 2018-04-17 ENCOUNTER — Other Ambulatory Visit: Payer: Self-pay

## 2018-05-01 ENCOUNTER — Other Ambulatory Visit: Payer: Self-pay

## 2018-05-01 DIAGNOSIS — E782 Mixed hyperlipidemia: Secondary | ICD-10-CM

## 2018-05-01 MED ORDER — SIMVASTATIN 40 MG PO TABS: 40.0000 mg | ORAL_TABLET | Freq: Every day | ORAL | 6 refills | Status: DC

## 2018-05-09 ENCOUNTER — Ambulatory Visit: Payer: Medicaid Other

## 2018-05-09 DIAGNOSIS — I700 Atherosclerosis of aorta: Secondary | ICD-10-CM | POA: Diagnosis not present

## 2018-05-16 NOTE — Procedures (Signed)
[place], [place] [address]  DATE OF SERVICE: May 09, 2018  CAROTID DOPPLER INTERPRETATION:  Bilateral Carotid Ultrsasound and Color Doppler Examination was performed. The RIGHT CCA shows mild plaque in the vessel. The LEFT CCA shows mild plaque in the vessel. There was no intimal thickening noted in the RIGHT carotid artery. There was no intimal thickening in the LEFT carotid artery.  The RIGHT CCA shows peak systolic velocity of 73 cm per second. The end diastolic velocity is 23 cm per second on the RIGHT side. The RIGHT ICA shows peak systolic velocity of 77 per second. RIGHT sided ICA end diastolic velocity is 32 cm per second. The RIGHT ECA shows a peak systolic velocity of 83 cm per second. The ICA/CCA ratio is calculated to be 0.8. This suggests no significant stenosis in the vessel. The Vertebral Artery shows antegrade flow.  The LEFT CCA shows peak systolic velocity of 71 cm per second. The end diastolic velocity is 21 cm per second on the LEFT side. The LEFT ICA shows peak systolic velocity of 78 per second. LEFT sided ICA end diastolic velocity is 30 cm per second. The LEFT ECA shows a peak systolic velocity of 74 cm per second. The ICA/CCA ratio is calculated to be 0.8. This suggests no significant stenosis in the vessel. The Vertebral Artery shows antegrade flow.   Impression:    The RIGHT CAROTID shows no significant stenosis in the vessel. The LEFT CAROTID shows no significant stenosis in the vessel.  There is mild plaque formation noted on the LEFT and mild plaque formation on the RIGHT  side. Consider a repeat Carotid doppler if clinical situation and symptoms warrant in 6-12 months. Patient should be encouraged to change lifestyles such as smoking cessation, regular exercise and dietary modification. Use of statins in the right clinical setting and ASA is encouraged.  Allyne Gee, MD [facility] Pulmonary Critical Care Medicine

## 2018-06-04 ENCOUNTER — Telehealth: Payer: Self-pay

## 2018-06-04 NOTE — Telephone Encounter (Signed)
Left a message on pt voicemail informing him to give me a call back for his test results

## 2018-06-04 NOTE — Telephone Encounter (Signed)
-----   Message from Ronnell Freshwater, NP sent at 06/02/2018  8:10 AM EDT ----- Please let the patient know that his carotid doppler looked good. Does show some plaque in both carotid arteries, however, there is no significant stenosis. This is something we will monitor. Repeat study in one year. Thanks.

## 2018-06-04 NOTE — Telephone Encounter (Signed)
Pt returned called and informed him of his results.

## 2018-06-28 ENCOUNTER — Other Ambulatory Visit: Payer: Self-pay | Admitting: Nurse Practitioner

## 2018-06-28 DIAGNOSIS — I10 Essential (primary) hypertension: Secondary | ICD-10-CM

## 2018-08-05 ENCOUNTER — Other Ambulatory Visit: Payer: Medicaid Other

## 2018-08-05 ENCOUNTER — Encounter: Payer: Self-pay | Admitting: Urology

## 2018-08-07 ENCOUNTER — Other Ambulatory Visit: Payer: Medicaid Other

## 2018-08-07 ENCOUNTER — Ambulatory Visit: Payer: Medicaid Other | Admitting: Urology

## 2018-08-07 DIAGNOSIS — N401 Enlarged prostate with lower urinary tract symptoms: Principal | ICD-10-CM

## 2018-08-07 DIAGNOSIS — R3915 Urgency of urination: Secondary | ICD-10-CM

## 2018-08-07 DIAGNOSIS — N138 Other obstructive and reflux uropathy: Secondary | ICD-10-CM

## 2018-08-07 DIAGNOSIS — R350 Frequency of micturition: Secondary | ICD-10-CM

## 2018-08-07 DIAGNOSIS — N3941 Urge incontinence: Secondary | ICD-10-CM

## 2018-08-07 LAB — URINALYSIS, COMPLETE
Bilirubin, UA: NEGATIVE
Glucose, UA: NEGATIVE
Ketones, UA: NEGATIVE
Nitrite, UA: NEGATIVE
Protein, UA: NEGATIVE
RBC, UA: NEGATIVE
Specific Gravity, UA: 1.025 (ref 1.005–1.030)
Urobilinogen, Ur: 0.2 mg/dL (ref 0.2–1.0)
pH, UA: 5.5 (ref 5.0–7.5)

## 2018-08-07 LAB — MICROSCOPIC EXAMINATION
Epithelial Cells (non renal): NONE SEEN /hpf (ref 0–10)
RBC, UA: NONE SEEN /hpf (ref 0–2)

## 2018-08-08 ENCOUNTER — Encounter: Payer: Self-pay | Admitting: Urology

## 2018-08-08 ENCOUNTER — Ambulatory Visit (INDEPENDENT_AMBULATORY_CARE_PROVIDER_SITE_OTHER): Payer: Medicaid Other | Admitting: Urology

## 2018-08-08 VITALS — BP 131/84 | HR 78 | Ht 68.0 in | Wt 159.4 lb

## 2018-08-08 DIAGNOSIS — N401 Enlarged prostate with lower urinary tract symptoms: Secondary | ICD-10-CM | POA: Diagnosis not present

## 2018-08-08 DIAGNOSIS — R3915 Urgency of urination: Secondary | ICD-10-CM | POA: Diagnosis not present

## 2018-08-08 DIAGNOSIS — Z87448 Personal history of other diseases of urinary system: Secondary | ICD-10-CM | POA: Diagnosis not present

## 2018-08-08 DIAGNOSIS — N529 Male erectile dysfunction, unspecified: Secondary | ICD-10-CM

## 2018-08-08 DIAGNOSIS — N138 Other obstructive and reflux uropathy: Secondary | ICD-10-CM

## 2018-08-08 LAB — PSA: Prostate Specific Ag, Serum: 0.5 ng/mL (ref 0.0–4.0)

## 2018-08-08 MED ORDER — TADALAFIL 5 MG PO TABS: 5.0000 mg | ORAL_TABLET | Freq: Every day | ORAL | 6 refills | Status: DC | PRN

## 2018-08-08 NOTE — Progress Notes (Addendum)
08/08/2018 12:16 PM   Alex Bowers 09-10-1955 [id number]  Referring provider: [name], [place], [place] [address]  Chief Complaint  Patient presents with  . Urinary Incontinence    HPI: 62 yo WM with a history of urgency, BPH with LUTS, ED and history of hematuria  presents today for a 6 month f/u.    Urgency He could not tolerate Myrbetriq due to diarrhea.  He could not tolerate Toviaz 8mg  due to constipation.  His insurance would not cover the PTNS.  He was not a good candidate for Botox or Interstim.    He reports that urgency worsens when he is out of the house.   BPH WITH LUTS  (prostate and/or bladder)  His has had cystoscopy in 2017 he was found to have impressive intraluminal bilobar hypertrophy.    Today he c/o of difficulties with postponing urination, intermittent stream and weak stream. He is currently taking finasteride 5 mg daily. His I PSS score today is 18/4 and his previous score was 12/3.   He drinks water, beer at night and one coke a day. He drinks 2 cups of coffee a day, no juice, no energy drinks, 1-2 glasses of sweet tea. He does not drink anything when driving.   Patient denies any gross hematuria, dysuria or suprapubic/flank pain.  Patient denies any fevers, chills, nausea or vomiting.   He does not have a family history of PCa.  IPSS    Row Name 08/08/18 1100         International Prostate Symptom Score   How often have you had the sensation of not emptying your bladder?  Less than half the time     How often have you had to urinate less than every two hours?  About half the time     How often have you found you stopped and started again several times when you urinated?  Less than half the time     How often have you found it difficult to postpone urination?  Less than half the time     How often have you had a weak urinary stream?  About half the time     How often have you had to strain to start urination?   Less than half the time     How many times did you typically get up at night to urinate?  4 Times     Total IPSS Score  18       Quality of Life due to urinary symptoms   If you were to spend the rest of your life with your urinary condition just the way it is now how would you feel about that?  Mostly Disatisfied         Score:  1-7 Mild 8-19 Moderate 20-35 Severe   Erectile dysfunction His SHIM score is 10, which is moderate ED.   His previous SHIM score was 10.  He has been having difficulty with erections for several years.   His major complaint is achieving an erections.  His libido is preserved.   His risk factors for ED are age, BPH, HTN, smoking and blood pressure medications. He denies any painful erections or curvatures with his erections.     SHIM    Row Name 08/08/18 1135         SHIM: Over the last 6 months:   How do you rate your confidence that you could get and keep an erection?  Low     When you had erections with sexual stimulation, how often were your erections hard enough for penetration (entering your partner)?  A Few Times (much less than half the time)     During sexual intercourse, how often were you able to maintain your erection after you had penetrated (entered) your partner?  A Few Times (much less than half the time)     During sexual intercourse, how difficult was it to maintain your erection to completion of intercourse?  Very Difficult     When you attempted sexual intercourse, how often was it satisfactory for you?  A Few Times (much less than half the time)       SHIM Total Score   SHIM  10        He c/o that erections are not firm and he takes 5 tablets of sildenafil 20 mg on an empty stomach.   Score: 1-7 Severe ED 8-11 Moderate ED 12-16 Mild-Moderate ED 17-21 Mild ED 22-25 No ED  History of hematuria  He completed a hematuria workup in 2017 and was found to have impressive intraluminal bilobar hypertrophy.  He has not seen any gross  hematuria.  UA from yesterday was negative.   PMH: Past Medical History:  Diagnosis Date  . Hyperlipidemia   . Hypertension   . Personal history of tobacco use, presenting hazards to health 12/29/2015  . Stroke [facility]) 2014    Surgical History: Past Surgical History:  Procedure Laterality Date  . COLONOSCOPY  2015  . HERNIA REPAIR Right 06/09/14   inguinal   . teeth removal       Home Medications:  Allergies as of 08/08/2018   No Known Allergies     Medication List       Accurate as of August 08, 2018 12:16 PM. Always use your most recent med list.        amphetamine-dextroamphetamine 5 MG tablet Commonly known as:  ADDERALL Take 5 mg by mouth daily.   ADDERALL XR 15 MG 24 hr capsule Generic drug:  amphetamine-dextroamphetamine TAKE 1 TABLET IN AFTERNOON   aspirin EC 81 MG tablet Take 81 mg by mouth daily.   buPROPion 150 MG 24 hr tablet Commonly known as:  WELLBUTRIN XL Take 150 mg by mouth daily.   citalopram 40 MG tablet Commonly known as:  CELEXA Take 20 mg by mouth daily.   finasteride 5 MG tablet Commonly known as:  PROSCAR Take 1 tablet (5 mg total) by mouth daily.   FISH OIL + D3 PO Take by mouth.   lisinopril 10 MG tablet Commonly known as:  PRINIVIL,ZESTRIL TAKE 1 TABLET BY MOUTH EVERY DAY   MULTI VITAMIN MENS PO Take by mouth.   sildenafil 20 MG tablet Commonly known as:  REVATIO Take 3 to 5 tablets two hours before intercouse on an empty stomach.  Do not take with nitrates.   simvastatin 40 MG tablet Commonly known as:  ZOCOR Take 1 tablet (40 mg total) by mouth daily at 6 PM.   tadalafil 5 MG tablet Commonly known as:  CIALIS Take 1 tablet (5 mg total) by mouth daily as needed for erectile dysfunction.   triamcinolone 0.025 % cream Commonly known as:  KENALOG Apply 1 application topically 2 (two) times daily.   vitamin C 1000 MG tablet Take 1,000 mg by mouth 2 (two) times daily.       Allergies: No Known  Allergies  Family History: Family History  Problem Relation Age  of Onset  . Cancer - Other Brother        phageal  . Heart attack Father   . Colon polyps Mother   . Prostate cancer Neg Hx   . Kidney cancer Neg Hx   . Bladder Cancer Neg Hx     Social History:  reports that he has been smoking cigarettes. He has a 74.00 pack-year smoking history. He has never used smokeless tobacco. He reports current alcohol use. He reports that he does not use drugs.  ROS: UROLOGY Frequent Urination?: No Hard to postpone urination?: Yes Burning/pain with urination?: No Get up at night to urinate?: No Leakage of urine?: No Urine stream starts and stops?: Yes Trouble starting stream?: No Do you have to strain to urinate?: No Blood in urine?: No Urinary tract infection?: No Sexually transmitted disease?: No Injury to kidneys or bladder?: No Painful intercourse?: No Weak stream?: Yes Erection problems?: Yes Penile pain?: No  Gastrointestinal Nausea?: No Vomiting?: No Indigestion/heartburn?: No Diarrhea?: No Constipation?: No  Constitutional Fever: No Night sweats?: No Weight loss?: No Fatigue?: No  Skin Skin rash/lesions?: No Itching?: No  Eyes Blurred vision?: No Double vision?: No  Ears/Nose/Throat Sore throat?: No Sinus problems?: No  Hematologic/Lymphatic Swollen glands?: No Easy bruising?: No  Cardiovascular Leg swelling?: No Chest pain?: No  Respiratory Cough?: No Shortness of breath?: No  Endocrine Excessive thirst?: No  Musculoskeletal Back pain?: No Joint pain?: No  Neurological Headaches?: No Dizziness?: No  Psychologic Depression?: Yes Anxiety?: No  Physical Exam: BP 131/84 (BP Location: Left Arm, Patient Position: Sitting, Cuff Size: Normal)   Pulse 78   Ht 5\' 8"  (1.727 m)   Wt 159 lb 6.4 oz (72.3 kg)   BMI 24.24 kg/m   Constitutional: Well nourished. Alert and oriented, No acute distress. HEENT: [place] AT, moist mucus membranes.  Trachea midline, no masses. Cardiovascular: No clubbing, cyanosis, or edema. Respiratory: Normal respiratory effort, no increased work of breathing. GU: No CVA tenderness.  No bladder fullness or masses.  Patient with circumcised.  Urethral meatus is patent.  No penile discharge. No penile lesions or rashes. Scrotum without lesions, cysts, rashes and/or edema.  Testicles are located scrotally bilaterally. No masses are appreciated in the testicles. Left and right epididymis are normal.  Rectal: Patient with  normal sphincter tone. Anus and perineum without scarring or rashes. No rectal masses are appreciated. Prostate is approximately 45 grams, no nodules are appreciated. Seminal vesicles are normal. Skin: No rashes, bruises or suspicious lesions. Lymph: No cervical adenopathy. Left sided inguinal hernia.  Neurologic: Grossly intact, no focal deficits, moving all 4 extremities. Psychiatric: Normal mood and affect.  Laboratory Data: PSA History  1.0 ng/mL on 06/07/2015 - started on finasteride  0.6 ng/mL on 10/11/2016 (1.2)  0.7 ng/mL on 01/30/2018  0.5 ng/mL on 08/07/2018  I have reviewed the labs.  Assessment & Plan:    1. Urgency Failed Myrbetriq - ADR of diarrhea and failed Toviaz 8 mg daily - ADR of constipation Discussed PT and PTNS - insurance will not cover  Not a candidate for Botox or Interstim  2. BPH with LU TS  - I PSS 18/4, increased  - Continue conservative management, avoiding bladder irritants and timed voiding's  - urgency is most bothersome symptom   - Continue finasteride 5 mg daily  - RTC in 6 months for I PSS, PSA and exam   3. Erectile dysfunction  SHIM score is 10, it is worsening Found no benefit with testosterone therapy  Refill sildenafil 20 mg, 3 to 5 tablets prior to intercourse; not helpful Discussed testosterone injections with pt; he was interested in medication; Rx of Tadalafil was given RTC in 6 months for SHIM    4. History of  hematuria   -  continue finasteride 5 mg   - UA is negative   - check UA yearly (07/2019)  Return in about 6 months (around 02/07/2019) for ipss/shim/psa (psa before appt).  These notes generated with voice recognition software. I apologize for typographical errors.  Zara Council, PA-C  [place] [address] [place], [place] [address] [phone]  I, Lucas Mallow, am acting as a Education administrator for [employer],  I have reviewed the above documentation for accuracy and completeness, and I agree with the above.    Zara Council, PA-C

## 2018-08-11 ENCOUNTER — Telehealth: Payer: Self-pay | Admitting: Urology

## 2018-08-11 LAB — CULTURE, URINE COMPREHENSIVE

## 2018-08-11 NOTE — Telephone Encounter (Signed)
Left vm to call back  McGowan, Hunt Oris, PA-C  [facility]        Please let Mr. Mikesell know that his urine culture was negative.

## 2018-08-14 NOTE — Telephone Encounter (Signed)
Called and advised pt that urine culture was negative.

## 2018-10-16 ENCOUNTER — Encounter: Payer: Self-pay | Admitting: Nurse Practitioner

## 2018-10-16 ENCOUNTER — Ambulatory Visit: Payer: Medicaid Other | Admitting: Nurse Practitioner

## 2018-10-16 VITALS — BP 136/88 | HR 78 | Resp 16 | Ht 68.0 in | Wt 166.4 lb

## 2018-10-16 DIAGNOSIS — E782 Mixed hyperlipidemia: Secondary | ICD-10-CM | POA: Diagnosis not present

## 2018-10-16 DIAGNOSIS — I10 Essential (primary) hypertension: Secondary | ICD-10-CM | POA: Diagnosis not present

## 2018-10-16 DIAGNOSIS — R300 Dysuria: Secondary | ICD-10-CM

## 2018-10-16 DIAGNOSIS — Z0001 Encounter for general adult medical examination with abnormal findings: Secondary | ICD-10-CM | POA: Diagnosis not present

## 2018-10-16 NOTE — Progress Notes (Signed)
[facility] [place], [place] [address]  Internal MEDICINE  Office Visit Note  Patient Name: Alex Bowers  [id number]  [id number]  Date of Service: 11/02/2018   Pt is here for routine health maintenance examination  Chief Complaint  Patient presents with  . Annual Exam    medicaid physical  . Quality Metric Gaps    pt did not get the flu vaccine     Hypertension  This is a chronic problem. The current episode started more than 1 year ago. The problem is unchanged. The problem is resistant. Pertinent negatives include no chest pain, headaches, neck pain, palpitations or shortness of breath. Agents associated with hypertension include amphetamines. Risk factors for coronary artery disease include dyslipidemia, male gender and stress. Past treatments include ACE inhibitors. The current treatment provides moderate improvement. Hypertensive end-organ damage includes CVA.     Current Medication: Outpatient Encounter Medications as of 10/16/2018  Medication Sig  . ADDERALL XR 15 MG 24 hr capsule TAKE 1 TABLET IN AFTERNOON  . amphetamine-dextroamphetamine (ADDERALL) 5 MG tablet Take 5 mg by mouth daily.  . Ascorbic Acid (VITAMIN C) 1000 MG tablet Take 1,000 mg by mouth 2 (two) times daily.  Marland Kitchen aspirin EC 81 MG tablet Take 81 mg by mouth daily.  Marland Kitchen buPROPion (WELLBUTRIN XL) 150 MG 24 hr tablet Take 150 mg by mouth daily.  . citalopram (CELEXA) 40 MG tablet Take 20 mg by mouth daily.   . finasteride (PROSCAR) 5 MG tablet Take 1 tablet (5 mg total) by mouth daily.  . Fish Oil-Cholecalciferol (FISH OIL + D3 PO) Take by mouth.  Marland Kitchen lisinopril (PRINIVIL,ZESTRIL) 10 MG tablet TAKE 1 TABLET BY MOUTH EVERY DAY  . Multiple Vitamin (MULTI VITAMIN MENS PO) Take by mouth.  . sildenafil (REVATIO) 20 MG tablet Take 3 to 5 tablets two hours before intercouse on an empty stomach.  Do not take with nitrates.  . simvastatin (ZOCOR) 40 MG tablet Take 1 tablet (40 mg total)  by mouth daily at 6 PM.  . tadalafil (CIALIS) 5 MG tablet Take 1 tablet (5 mg total) by mouth daily as needed for erectile dysfunction.  . triamcinolone (KENALOG) 0.025 % cream Apply 1 application topically 2 (two) times daily.   No facility-administered encounter medications on file as of 10/16/2018.     Surgical History: Past Surgical History:  Procedure Laterality Date  . COLONOSCOPY  2015  . HERNIA REPAIR Right 06/09/14   inguinal   . teeth removal       Medical History: Past Medical History:  Diagnosis Date  . Hyperlipidemia   . Hypertension   . Personal history of tobacco use, presenting hazards to health 12/29/2015  . Stroke [facility]) 2014    Family History: Family History  Problem Relation Age of Onset  . Cancer - Other Brother        phageal  . Heart attack Father   . Colon polyps Mother   . Prostate cancer Neg Hx   . Kidney cancer Neg Hx   . Bladder Cancer Neg Hx       Review of Systems  Constitutional: Negative for chills, fatigue and unexpected weight change.  HENT: Negative for congestion, postnasal drip, rhinorrhea, sneezing and sore throat.   Respiratory: Negative for cough, chest tightness, shortness of breath and wheezing.   Cardiovascular: Negative for chest pain and palpitations.  Gastrointestinal: Negative for abdominal pain, constipation, diarrhea, nausea and vomiting.  Endocrine: Negative for cold intolerance, heat intolerance,  polydipsia and polyuria.  Genitourinary: Positive for frequency.       The patient sees urology for problems with BPH  Musculoskeletal: Negative for arthralgias, back pain, joint swelling and neck pain.       Low back pain with some development of sciatica.   Skin: Negative for rash.  Allergic/Immunologic: Negative for environmental allergies.  Neurological: Negative for dizziness, tremors, numbness and headaches.  Hematological: Negative for adenopathy. Does not bruise/bleed easily.  Psychiatric/Behavioral: Positive for  decreased concentration. Negative for behavioral problems (Depression), sleep disturbance and suicidal ideas.       Patient sees psychiatry for severe ADHD.    Today's Vitals   10/16/18 1439  BP: 136/88  Pulse: 78  Resp: 16  SpO2: 95%  Weight: 166 lb 6.4 oz (75.5 kg)  Height: 5\' 8"  (1.727 m)   Body mass index is 25.3 kg/m.  Physical Exam Vitals signs and nursing note reviewed.  Constitutional:      General: He is not in acute distress.    Appearance: Normal appearance. He is well-developed. He is not diaphoretic.  HENT:     Head: Normocephalic and atraumatic.     Mouth/Throat:     Pharynx: No oropharyngeal exudate.  Eyes:     Conjunctiva/sclera: Conjunctivae normal.     Pupils: Pupils are equal, round, and reactive to light.  Neck:     Musculoskeletal: Normal range of motion and neck supple.     Thyroid: No thyromegaly.     Vascular: No carotid bruit or JVD.     Trachea: No tracheal deviation.     Comments: Soft bruit on right side of the neck.  Cardiovascular:     Rate and Rhythm: Normal rate. Rhythm irregular.     Pulses: Normal pulses.     Heart sounds: Normal heart sounds. No murmur. No friction rub. No gallop.   Pulmonary:     Effort: Pulmonary effort is normal. No respiratory distress.     Breath sounds: Normal breath sounds. No wheezing or rales.  Chest:     Chest wall: No tenderness.  Abdominal:     General: Bowel sounds are normal.     Palpations: Abdomen is soft.     Tenderness: There is no abdominal tenderness.  Musculoskeletal: Normal range of motion.  Lymphadenopathy:     Cervical: No cervical adenopathy.  Skin:    General: Skin is warm and dry.     Findings: No rash.  Neurological:     General: No focal deficit present.     Mental Status: He is alert and oriented to person, place, and time. Mental status is at baseline.     Cranial Nerves: No cranial nerve deficit.  Psychiatric:        Mood and Affect: Mood is anxious and depressed.         Speech: Speech normal.        Behavior: Behavior normal.        Thought Content: Thought content normal.        Judgment: Judgment normal.      LABS: Recent Results (from the past 2160 hour(s))  Urinalysis, Complete     Status: Abnormal   Collection Time: 08/07/18  2:26 PM  Result Value Ref Range   Specific Gravity, UA 1.025 1.005 - 1.030   pH, UA 5.5 5.0 - 7.5   Color, UA Yellow Yellow   Appearance Ur Clear Clear   Leukocytes, UA Trace (A) Negative   Protein, UA Negative Negative/Trace  Glucose, UA Negative Negative   Ketones, UA Negative Negative   RBC, UA Negative Negative   Bilirubin, UA Negative Negative   Urobilinogen, Ur 0.2 0.2 - 1.0 mg/dL   Nitrite, UA Negative Negative   Microscopic Examination See below:   Microscopic Examination     Status: Abnormal   Collection Time: 08/07/18  2:26 PM  Result Value Ref Range   WBC, UA 0-5 0 - 5 /hpf   RBC, UA None seen 0 - 2 /hpf   Epithelial Cells (non renal) None seen 0 - 10 /hpf   Mucus, UA Present (A) Not Estab.   Bacteria, UA Few (A) None seen/Few  PSA     Status: None   Collection Time: 08/07/18  2:30 PM  Result Value Ref Range   Prostate Specific Ag, Serum 0.5 0.0 - 4.0 ng/mL    Comment: Roche ECLIA methodology. According to the American Urological Association, Serum PSA should decrease and remain at undetectable levels after radical prostatectomy. The AUA defines biochemical recurrence as an initial PSA value 0.2 ng/mL or greater followed by a subsequent confirmatory PSA value 0.2 ng/mL or greater. Values obtained with different assay methods or kits cannot be used interchangeably. Results cannot be interpreted as absolute evidence of the presence or absence of malignant disease.   CULTURE, URINE COMPREHENSIVE     Status: None   Collection Time: 08/07/18  3:00 PM  Result Value Ref Range   Urine Culture, Comprehensive Final report    Organism ID, Bacteria Comment     Comment: No growth in 36 - 48 hours.  UA/M  w/rflx Culture, Routine     Status: Abnormal   Collection Time: 10/16/18  3:25 PM  Result Value Ref Range   Specific Gravity, UA 1.020 1.005 - 1.030   pH, UA 5.0 5.0 - 7.5   Color, UA Yellow Yellow   Appearance Ur Clear Clear   Leukocytes, UA Trace (A) Negative   Protein, UA Negative Negative/Trace   Glucose, UA Negative Negative   Ketones, UA Negative Negative   RBC, UA Negative Negative   Bilirubin, UA Negative Negative   Urobilinogen, Ur 0.2 0.2 - 1.0 mg/dL   Nitrite, UA Negative Negative   Microscopic Examination See below:     Comment: Microscopic was indicated and was performed.   Urinalysis Reflex Comment     Comment: This specimen has reflexed to a Urine Culture.  Microscopic Examination     Status: Abnormal   Collection Time: 10/16/18  3:25 PM  Result Value Ref Range   WBC, UA 0-5 0 - 5 /hpf   RBC, UA 0-2 0 - 2 /hpf   Epithelial Cells (non renal) None seen 0 - 10 /hpf   Casts Present (A) None seen /lpf   Cast Type Hyaline casts N/A   Mucus, UA Present Not Estab.   Bacteria, UA None seen None seen/Few  Urine Culture, Reflex     Status: None   Collection Time: 10/16/18  3:25 PM  Result Value Ref Range   Urine Culture, Routine Final report    Organism ID, Bacteria No growth    Assessment/Plan:  1. Encounter for general adult medical examination with abnormal findings Annual health maintenance exam.   2. Essential hypertension Stable. Continue bp medication as prescribed   3. Mixed hyperlipidemia Continue simvastatin as prescribed   4. Dysuria - UA/M w/rflx Culture, Routine  General Counseling: Salvatore verbalizes understanding of the findings of todays visit and agrees with plan of treatment. I  have discussed any further diagnostic evaluation that may be needed or ordered today. We also reviewed his medications today. he has been encouraged to call the office with any questions or concerns that should arise related to todays  visit.    Counseling:  Hypertension Counseling:   The following hypertensive lifestyle modification were recommended and discussed:  1. Limiting alcohol intake to less than 1 oz/day of ethanol:(24 oz of beer or 8 oz of wine or 2 oz of 100-proof whiskey). 2. Take baby ASA 81 mg daily. 3. Importance of regular aerobic exercise and losing weight. 4. Reduce dietary saturated fat and cholesterol intake for overall cardiovascular health. 5. Maintaining adequate dietary potassium, calcium, and magnesium intake. 6. Regular monitoring of the blood pressure. 7. Reduce sodium intake to less than 100 mmol/day (less than 2.3 gm of sodium or less than 6 gm of sodium choride)   This patient was seen by [place] with Dr [name] as a part of collaborative care agreement  Orders Placed This Encounter  Procedures  . Microscopic Examination  . Urine Culture, Reflex  . UA/M w/rflx Culture, Routine      Time spent: [place] Robinson, MD  Internal Medicine

## 2018-10-19 LAB — URINE CULTURE, REFLEX: Organism ID, Bacteria: NO GROWTH

## 2018-10-19 LAB — UA/M W/RFLX CULTURE, ROUTINE
Bilirubin, UA: NEGATIVE
Glucose, UA: NEGATIVE
Ketones, UA: NEGATIVE
Nitrite, UA: NEGATIVE
Protein, UA: NEGATIVE
RBC UA: NEGATIVE
Specific Gravity, UA: 1.02 (ref 1.005–1.030)
Urobilinogen, Ur: 0.2 mg/dL (ref 0.2–1.0)
pH, UA: 5 (ref 5.0–7.5)

## 2018-10-19 LAB — MICROSCOPIC EXAMINATION
Bacteria, UA: NONE SEEN
Epithelial Cells (non renal): NONE SEEN /hpf (ref 0–10)

## 2018-11-02 DIAGNOSIS — R300 Dysuria: Secondary | ICD-10-CM | POA: Insufficient documentation

## 2019-01-26 ENCOUNTER — Other Ambulatory Visit: Payer: Self-pay

## 2019-01-26 DIAGNOSIS — I10 Essential (primary) hypertension: Secondary | ICD-10-CM

## 2019-01-26 MED ORDER — LISINOPRIL 10 MG PO TABS: 10.0000 mg | ORAL_TABLET | Freq: Every day | ORAL | 6 refills | Status: DC

## 2019-02-04 ENCOUNTER — Other Ambulatory Visit: Payer: Self-pay | Admitting: Urology

## 2019-02-04 DIAGNOSIS — N138 Other obstructive and reflux uropathy: Secondary | ICD-10-CM

## 2019-02-09 ENCOUNTER — Other Ambulatory Visit: Payer: Self-pay | Admitting: *Deleted

## 2019-02-09 DIAGNOSIS — N138 Other obstructive and reflux uropathy: Secondary | ICD-10-CM

## 2019-02-10 ENCOUNTER — Encounter: Payer: Self-pay | Admitting: Urology

## 2019-02-10 ENCOUNTER — Other Ambulatory Visit: Payer: Medicaid Other

## 2019-02-11 ENCOUNTER — Other Ambulatory Visit: Payer: Self-pay

## 2019-02-11 ENCOUNTER — Other Ambulatory Visit: Payer: Medicaid Other

## 2019-02-11 DIAGNOSIS — N138 Other obstructive and reflux uropathy: Secondary | ICD-10-CM

## 2019-02-12 LAB — PSA: Prostate Specific Ag, Serum: 0.4 ng/mL (ref 0.0–4.0)

## 2019-02-12 NOTE — Progress Notes (Signed)
08/08/2018 4:24 PM   Alex Bowers May 25, 1956 [id number]  Referring provider: [name], [place],  [place] [address]  Chief Complaint  Patient presents with  . Benign Prostatic Hypertrophy  . history of hematoria    HPI: 63 yo WM with a history of urgency, BPH with LUTS, ED and history of hematuria  presents today for a 6 month f/u.    Urgency He could not tolerate Myrbetriq due to diarrhea.  He could not tolerate Toviaz 8mg  due to constipation.  His insurance would not cover the PTNS.  He was not a good candidate for Botox or Interstim.    BPH WITH LUTS  (prostate and/or bladder) IPSS score: 13/3      Previous score: 18/4     Major complaint(s):  Urgency and intermittency x several years. Denies any dysuria, hematuria or suprapubic pain.   Currently taking: tadalafil 5 mg daily and finasteride 5 mg daily   His has had a cystoscopy in 2017 and was found to have impressive intraluminal bilobar hypertrophy.     Denies any recent fevers, chills, nausea or vomiting.  He does not have a family history of PCa.  IPSS    Row Name 02/13/19 1100         International Prostate Symptom Score   How often have you had the sensation of not emptying your bladder?  Less than half the time     How often have you had to urinate less than every two hours?  Less than half the time     How often have you found you stopped and started again several times when you urinated?  Less than half the time     How often have you found it difficult to postpone urination?  Less than half the time     How often have you had a weak urinary stream?  Less than half the time     How often have you had to strain to start urination?  Less than 1 in 5 times     How many times did you typically get up at night to urinate?  2 Times     Total IPSS Score  13       Quality of Life due to urinary symptoms   If you were to spend the rest of your life with your urinary condition just  the way it is now how would you feel about that?  Mixed        Score:  1-7 Mild 8-19 Moderate 20-35 Severe  Erectile dysfunction His SHIM score is 14, which is mild to moderate ED.   His previous SHIM score was 10.  He has been having difficulty with erections for several years.   His major complaint is achieving an erections.  His libido is preserved.   His risk factors for ED are age, BPH, HTN, smoking and blood pressure medications. He denies any painful erections or curvatures with his erections.     SHIM    Row Name 02/13/19 1101         SHIM: Over the last 6 months:   How do you rate your confidence that you could get and keep an erection?  Low     When you had erections with sexual stimulation, how often were your erections hard enough for penetration (entering your partner)?  Sometimes (about half the time)     During sexual intercourse, how often were you able to  maintain your erection after you had penetrated (entered) your partner?  Sometimes (about half the time)     During sexual intercourse, how difficult was it to maintain your erection to completion of intercourse?  Difficult     When you attempted sexual intercourse, how often was it satisfactory for you?  Sometimes (about half the time)       SHIM Total Score   SHIM  14         Score: 1-7 Severe ED 8-11 Moderate ED 12-16 Mild-Moderate ED 17-21 Mild ED 22-25 No ED  History of hematuria  He completed a hematuria workup in 2017 and was found to have impressive intraluminal bilobar hypertrophy.  He has not seen any gross hematuria.  UA today is negative for AMH.    PMH: Past Medical History:  Diagnosis Date  . Hyperlipidemia   . Hypertension   . Personal history of tobacco use, presenting hazards to health 12/29/2015  . Stroke [facility]) 2014    Surgical History: Past Surgical History:  Procedure Laterality Date  . COLONOSCOPY  2015  . HERNIA REPAIR Right 06/09/14   inguinal   . teeth removal       Home  Medications:  Allergies as of 02/13/2019   No Known Allergies     Medication List       Accurate as of February 13, 2019 11:59 PM. If you have any questions, ask your nurse or doctor.        amphetamine-dextroamphetamine 5 MG tablet Commonly known as: ADDERALL Take 5 mg by mouth daily.   Adderall XR 15 MG 24 hr capsule Generic drug: amphetamine-dextroamphetamine TAKE 1 TABLET IN AFTERNOON   aspirin EC 81 MG tablet Take 81 mg by mouth daily.   buPROPion 150 MG 12 hr tablet Commonly known as: WELLBUTRIN SR Take 150 mg by mouth daily. What changed: Another medication with the same name was removed. Continue taking this medication, and follow the directions you see here. Changed by: Zara Council, PA-C   citalopram 40 MG tablet Commonly known as: CELEXA Take 20 mg by mouth daily.   finasteride 5 MG tablet Commonly known as: PROSCAR TAKE 1 TABLET BY MOUTH EVERY DAY   FISH OIL + D3 PO Take by mouth.   lisinopril 10 MG tablet Commonly known as: ZESTRIL Take 1 tablet (10 mg total) by mouth daily.   MULTI VITAMIN MENS PO Take by mouth.   sildenafil 20 MG tablet Commonly known as: REVATIO Take 3 to 5 tablets two hours before intercouse on an empty stomach.  Do not take with nitrates.   simvastatin 40 MG tablet Commonly known as: ZOCOR Take 1 tablet (40 mg total) by mouth daily at 6 PM.   tadalafil 5 MG tablet Commonly known as: CIALIS Take 1 tablet (5 mg total) by mouth daily as needed for erectile dysfunction.   triamcinolone 0.025 % cream Commonly known as: KENALOG Apply 1 application topically 2 (two) times daily.   vitamin C 1000 MG tablet Take 1,000 mg by mouth 2 (two) times daily.       Allergies: No Known Allergies  Family History: Family History  Problem Relation Age of Onset  . Cancer - Other Brother        phageal  . Heart attack Father   . Colon polyps Mother   . Prostate cancer Neg Hx   . Kidney cancer Neg Hx   . Bladder Cancer Neg Hx      Social History:  reports  that he has been smoking cigarettes. He has a 74.00 pack-year smoking history. He has never used smokeless tobacco. He reports current alcohol use. He reports that he does not use drugs.  ROS: UROLOGY Frequent Urination?: No Hard to postpone urination?: Yes Burning/pain with urination?: No Get up at night to urinate?: No Leakage of urine?: No Urine stream starts and stops?: Yes Trouble starting stream?: No Do you have to strain to urinate?: No Blood in urine?: No Urinary tract infection?: No Sexually transmitted disease?: No Injury to kidneys or bladder?: No Painful intercourse?: No Weak stream?: No Erection problems?: Yes Penile pain?: No  Gastrointestinal Nausea?: No Vomiting?: No Indigestion/heartburn?: No Diarrhea?: No Constipation?: No  Constitutional Fever: No Night sweats?: No Weight loss?: No Fatigue?: No  Skin Skin rash/lesions?: No Itching?: No  Eyes Blurred vision?: No Double vision?: No  Ears/Nose/Throat Sore throat?: No Sinus problems?: No  Hematologic/Lymphatic Swollen glands?: No Easy bruising?: No  Cardiovascular Leg swelling?: No Chest pain?: No  Respiratory Cough?: Yes Shortness of breath?: No  Endocrine Excessive thirst?: No  Musculoskeletal Back pain?: No Joint pain?: No  Neurological Headaches?: No Dizziness?: No  Psychologic Depression?: Yes Anxiety?: No  Physical Exam: BP 138/84 (BP Location: Left Arm, Patient Position: Sitting)   Pulse 71   Ht 5\' 8"  (1.727 m)   Wt 156 lb (70.8 kg)   BMI 23.72 kg/m   Constitutional:  Well nourished. Alert and oriented, No acute distress. HEENT: [place] AT, moist mucus membranes.  Trachea midline, no masses. Cardiovascular: No clubbing, cyanosis, or edema. Respiratory: Normal respiratory effort, no increased work of breathing. GI: Abdomen is soft, non tender, non distended, no abdominal masses. Liver and spleen not palpable.  No hernias appreciated.  Stool  sample for occult testing is not indicated.   GU: No CVA tenderness.  No bladder fullness or masses.  Patient with circumcised phallus. Urethral meatus is patent.  No penile discharge. No penile lesions or rashes. Scrotum without lesions, cysts, rashes and/or edema.  Testicles are located scrotally bilaterally. No masses are appreciated in the testicles. Left and right epididymis are normal. Rectal: Patient with  normal sphincter tone. Anus and perineum without scarring or rashes. No rectal masses are appreciated. Prostate is approximately 45 grams, no nodules are appreciated. Seminal vesicles are normal. Skin: No rashes, bruises or suspicious lesions. Lymph: No inguinal adenopathy. Neurologic: Grossly intact, no focal deficits, moving all 4 extremities. Psychiatric: Normal mood and affect.  Laboratory Data: PSA History  1.0 ng/mL on 06/07/2015 - started on finasteride  0.6 ng/mL on 10/11/2016 (1.2)  0.7 ng/mL on 01/30/2018 (1.4)  0.5 ng/mL on 08/07/2018 (1.0)  0.4 ng/mL on 02/11/2019 (0.8) I have reviewed the labs.  Assessment & Plan:    1. Urgency Failed Myrbetriq - ADR of diarrhea and failed Toviaz 8 mg daily - ADR of constipation Discussed PT and PTNS - insurance will not cover  Not a candidate for Botox or Interstim  2. BPH with LU TS I PSS 13/3, it is improved  Continue conservative management, avoiding bladder irritants and timed voiding's Urgency is most bothersome symptom - need to manage conservatively - see above  Continue finasteride 5 mg daily RTC in 6 months for I PSS, PSA and exam   3. Erectile dysfunction  SHIM score is 14, it is improved Continue Cialis 5 mg daily  RTC in 6 months for SHIM and exam    4. History of hematuria Hematuria work up completed in 08/2015 - findings positive for friable  enlarged prostate - continue finasteride No report of gross hematuria  UA today is negative for AMH RTC in one year for UA - patient to report any gross hematuria in  the interim    Return in about 6 months (around 08/15/2019) for IPSS, SHIM, PSA and exam.  These notes generated with voice recognition software. I apologize for typographical errors.  Zara Council, PA-C  [facility] [address] [place], [place] [address] [phone]

## 2019-02-13 ENCOUNTER — Other Ambulatory Visit: Payer: Self-pay

## 2019-02-13 ENCOUNTER — Ambulatory Visit (INDEPENDENT_AMBULATORY_CARE_PROVIDER_SITE_OTHER): Payer: Medicaid Other | Admitting: Urology

## 2019-02-13 ENCOUNTER — Encounter: Payer: Self-pay | Admitting: Urology

## 2019-02-13 VITALS — BP 138/84 | HR 71 | Ht 68.0 in | Wt 156.0 lb

## 2019-02-13 DIAGNOSIS — N401 Enlarged prostate with lower urinary tract symptoms: Secondary | ICD-10-CM | POA: Diagnosis not present

## 2019-02-13 DIAGNOSIS — Z87448 Personal history of other diseases of urinary system: Secondary | ICD-10-CM

## 2019-02-13 DIAGNOSIS — R3915 Urgency of urination: Secondary | ICD-10-CM | POA: Diagnosis not present

## 2019-02-13 DIAGNOSIS — N529 Male erectile dysfunction, unspecified: Secondary | ICD-10-CM | POA: Diagnosis not present

## 2019-02-13 DIAGNOSIS — N138 Other obstructive and reflux uropathy: Secondary | ICD-10-CM

## 2019-02-13 LAB — URINALYSIS, COMPLETE
Bilirubin, UA: NEGATIVE
Glucose, UA: NEGATIVE
Ketones, UA: NEGATIVE
Nitrite, UA: NEGATIVE
Protein,UA: NEGATIVE
RBC, UA: NEGATIVE
Specific Gravity, UA: 1.01 (ref 1.005–1.030)
Urobilinogen, Ur: 0.2 mg/dL (ref 0.2–1.0)
pH, UA: 6 (ref 5.0–7.5)

## 2019-02-13 LAB — MICROSCOPIC EXAMINATION
Bacteria, UA: NONE SEEN
RBC, Urine: NONE SEEN /hpf (ref 0–2)

## 2019-02-13 MED ORDER — TADALAFIL 5 MG PO TABS: 5.0000 mg | ORAL_TABLET | Freq: Every day | ORAL | 6 refills | Status: DC | PRN

## 2019-02-18 ENCOUNTER — Telehealth: Payer: Self-pay | Admitting: *Deleted

## 2019-02-18 NOTE — Telephone Encounter (Signed)
Left message for patient to notify them that it is time to schedule annual low dose lung cancer screening CT scan. Instructed patient to call back to verify information prior to the scan being scheduled.  

## 2019-02-19 ENCOUNTER — Telehealth: Payer: Self-pay | Admitting: *Deleted

## 2019-02-19 DIAGNOSIS — Z122 Encounter for screening for malignant neoplasm of respiratory organs: Secondary | ICD-10-CM

## 2019-02-19 DIAGNOSIS — Z87891 Personal history of nicotine dependence: Secondary | ICD-10-CM

## 2019-02-19 NOTE — Telephone Encounter (Signed)
Patient has been notified that annual lung cancer screening low dose CT scan is due currently or will be in near future. Confirmed that patient is within the age range of 55-77, and asymptomatic, (no signs or symptoms of lung cancer). Patient denies illness that would prevent curative treatment for lung cancer if found. Verified smoking history, (current, 49.5 pack year). The shared decision making visit was done 06/23/14. Patient is agreeable for CT scan being scheduled.

## 2019-02-26 ENCOUNTER — Ambulatory Visit
Admission: RE | Admit: 2019-02-26 | Discharge: 2019-02-26 | Disposition: A | Discharge status: Home / Self Care | Payer: Medicaid Other | Source: Ambulatory Visit | Attending: Oncology | Admitting: Oncology

## 2019-02-26 ENCOUNTER — Other Ambulatory Visit: Payer: Self-pay

## 2019-02-26 DIAGNOSIS — Z122 Encounter for screening for malignant neoplasm of respiratory organs: Secondary | ICD-10-CM | POA: Insufficient documentation

## 2019-02-26 DIAGNOSIS — Z87891 Personal history of nicotine dependence: Secondary | ICD-10-CM | POA: Diagnosis present

## 2019-03-02 ENCOUNTER — Encounter: Payer: Self-pay | Admitting: *Deleted

## 2019-04-09 ENCOUNTER — Other Ambulatory Visit: Payer: Self-pay | Admitting: Nurse Practitioner

## 2019-04-10 LAB — COMPREHENSIVE METABOLIC PANEL
ALT: 17 IU/L (ref 0–44)
AST: 19 IU/L (ref 0–40)
Albumin/Globulin Ratio: 2 (ref 1.2–2.2)
Albumin: 4.7 g/dL (ref 3.8–4.8)
Alkaline Phosphatase: 58 IU/L (ref 39–117)
BUN/Creatinine Ratio: 14 (ref 10–24)
BUN: 12 mg/dL (ref 8–27)
Bilirubin Total: 0.6 mg/dL (ref 0.0–1.2)
CO2: 24 mmol/L (ref 20–29)
Calcium: 9.4 mg/dL (ref 8.6–10.2)
Chloride: 96 mmol/L (ref 96–106)
Creatinine, Ser: 0.85 mg/dL (ref 0.76–1.27)
GFR calc Af Amer: 108 mL/min/{1.73_m2} (ref >59)
GFR calc non Af Amer: 93 mL/min/{1.73_m2} (ref >59)
Globulin, Total: 2.3 g/dL (ref 1.5–4.5)
Glucose: 95 mg/dL (ref 65–99)
Potassium: 4.6 mmol/L (ref 3.5–5.2)
Sodium: 134 mmol/L (ref 134–144)
Total Protein: 7 g/dL (ref 6.0–8.5)

## 2019-04-10 LAB — CBC
Hematocrit: 45.8 % (ref 37.5–51.0)
Hemoglobin: 15.8 g/dL (ref 13.0–17.7)
MCH: 34.6 pg — ABNORMAL HIGH (ref 26.6–33.0)
MCHC: 34.5 g/dL (ref 31.5–35.7)
MCV: 100 fL — ABNORMAL HIGH (ref 79–97)
Platelets: 246 10*3/uL (ref 150–450)
RBC: 4.57 x10E6/uL (ref 4.14–5.80)
RDW: 12.2 % (ref 11.6–15.4)
WBC: 9.6 10*3/uL (ref 3.4–10.8)

## 2019-04-10 LAB — LIPID PANEL W/O CHOL/HDL RATIO
Cholesterol, Total: 155 mg/dL (ref 100–199)
HDL: 68 mg/dL (ref >39)
LDL Calculated: 78 mg/dL (ref 0–99)
Triglycerides: 45 mg/dL (ref 0–149)
VLDL Cholesterol Cal: 9 mg/dL (ref 5–40)

## 2019-04-10 LAB — T3: T3, Total: 109 ng/dL (ref 71–180)

## 2019-04-10 LAB — PSA: Prostate Specific Ag, Serum: 0.4 ng/mL (ref 0.0–4.0)

## 2019-04-10 LAB — T4, FREE: Free T4: 1.29 ng/dL (ref 0.82–1.77)

## 2019-04-10 LAB — TSH: TSH: 0.891 u[IU]/mL (ref 0.450–4.500)

## 2019-04-13 NOTE — Progress Notes (Signed)
Overall, these look good. Discuss with patient at visit 04/17/2019

## 2019-04-17 ENCOUNTER — Encounter: Payer: Self-pay | Admitting: Nurse Practitioner

## 2019-04-17 ENCOUNTER — Ambulatory Visit: Payer: Medicaid Other | Admitting: Nurse Practitioner

## 2019-04-17 VITALS — BP 149/84 | HR 71 | Resp 16 | Ht 68.0 in | Wt 156.0 lb

## 2019-04-17 DIAGNOSIS — M25511 Pain in right shoulder: Secondary | ICD-10-CM

## 2019-04-17 DIAGNOSIS — Z87891 Personal history of nicotine dependence: Secondary | ICD-10-CM

## 2019-04-17 DIAGNOSIS — I10 Essential (primary) hypertension: Secondary | ICD-10-CM

## 2019-04-17 DIAGNOSIS — E782 Mixed hyperlipidemia: Secondary | ICD-10-CM

## 2019-04-17 NOTE — Progress Notes (Signed)
[facility] [place], [place] [address]  Internal MEDICINE  Office Visit Note  Patient Name: Alex Bowers  [id number]  [id number]  Date of Service: 04/19/2019  Chief Complaint  Patient presents with  . ADD  . Hypertension    The patient is here for routine follow up. The patient states that he has right shoulder pain. Pain is most significant when he lifts with the right arm. He is able to vertically lift the arm, can cross the arm in front of and behind the body without difficulty. He recalls no injury to the arm. Feels as though he slept on it wrong. Pain has been present for last two to three months.  Blood pressure is slightly elevated, but overall is well controlled. The patient is a long time smoker. He has yearly low dose CT scan to screen for lung cancer. Last test was 02/2019 and was benign.       Current Medication: Outpatient Encounter Medications as of 04/17/2019  Medication Sig  . ADDERALL XR 15 MG 24 hr capsule TAKE 1 TABLET IN AFTERNOON  . amphetamine-dextroamphetamine (ADDERALL) 5 MG tablet Take 5 mg by mouth daily.  . Ascorbic Acid (VITAMIN C) 1000 MG tablet Take 1,000 mg by mouth 2 (two) times daily.  Marland Kitchen aspirin EC 81 MG tablet Take 81 mg by mouth daily.  Marland Kitchen buPROPion (WELLBUTRIN SR) 150 MG 12 hr tablet Take 150 mg by mouth daily.  . citalopram (CELEXA) 40 MG tablet Take 20 mg by mouth daily.   . finasteride (PROSCAR) 5 MG tablet TAKE 1 TABLET BY MOUTH EVERY DAY  . Fish Oil-Cholecalciferol (FISH OIL + D3 PO) Take by mouth.  Marland Kitchen lisinopril (ZESTRIL) 10 MG tablet Take 1 tablet (10 mg total) by mouth daily.  . Multiple Vitamin (MULTI VITAMIN MENS PO) Take by mouth.  . sildenafil (REVATIO) 20 MG tablet Take 3 to 5 tablets two hours before intercouse on an empty stomach.  Do not take with nitrates.  . simvastatin (ZOCOR) 40 MG tablet Take 1 tablet (40 mg total) by mouth daily at 6 PM.  . tadalafil (CIALIS) 5 MG tablet Take 1 tablet (5 mg  total) by mouth daily as needed for erectile dysfunction.  . triamcinolone (KENALOG) 0.025 % cream Apply 1 application topically 2 (two) times daily.   No facility-administered encounter medications on file as of 04/17/2019.     Surgical History: Past Surgical History:  Procedure Laterality Date  . COLONOSCOPY  2015  . HERNIA REPAIR Right 06/09/14   inguinal   . teeth removal       Medical History: Past Medical History:  Diagnosis Date  . Hyperlipidemia   . Hypertension   . Personal history of tobacco use, presenting hazards to health 12/29/2015  . Stroke [facility]) 2014    Family History: Family History  Problem Relation Age of Onset  . Cancer - Other Brother        phageal  . Heart attack Father   . Colon polyps Mother   . Prostate cancer Neg Hx   . Kidney cancer Neg Hx   . Bladder Cancer Neg Hx     Social History   Socioeconomic History  . Marital status: Divorced    Spouse name: Not on file  . Number of children: Not on file  . Years of education: Not on file  . Highest education level: Not on file  Occupational History  . Not on file  Social Needs  .  Financial resource strain: Not on file  . Food insecurity    Worry: Not on file    Inability: Not on file  . Transportation needs    Medical: Not on file    Non-medical: Not on file  Tobacco Use  . Smoking status: Current Every Day Smoker    Packs/day: 2.00    Years: 37.00    Pack years: 74.00    Types: Cigarettes  . Smokeless tobacco: Never Used  Substance and Sexual Activity  . Alcohol use: Yes    Comment: occasionally  . Drug use: No  . Sexual activity: Not on file  Lifestyle  . Physical activity    Days per week: Not on file    Minutes per session: Not on file  . Stress: Not on file  Relationships  . Social Herbalist on phone: Not on file    Gets together: Not on file    Attends religious service: Not on file    Active member of club or organization: Not on file    Attends meetings  of clubs or organizations: Not on file    Relationship status: Not on file  . Intimate partner violence    Fear of current or ex partner: Not on file    Emotionally abused: Not on file    Physically abused: Not on file    Forced sexual activity: Not on file  Other Topics Concern  . Not on file  Social History Narrative  . Not on file      Review of Systems  Constitutional: Negative for chills, fatigue and unexpected weight change.  HENT: Negative for congestion, postnasal drip, rhinorrhea, sneezing and sore throat.   Respiratory: Negative for cough, chest tightness, shortness of breath and wheezing.   Cardiovascular: Negative for chest pain and palpitations.  Gastrointestinal: Negative for abdominal pain, constipation, diarrhea, nausea and vomiting.  Endocrine: Negative for cold intolerance, heat intolerance, polydipsia and polyuria.  Genitourinary: Positive for frequency.       The patient sees urology for problems with BPH  Musculoskeletal: Positive for arthralgias and myalgias. Negative for back pain, joint swelling and neck pain.       Right shoulder pain .   Skin: Negative for rash.  Allergic/Immunologic: Negative for environmental allergies.  Neurological: Negative for dizziness, tremors, numbness and headaches.  Hematological: Negative for adenopathy. Does not bruise/bleed easily.  Psychiatric/Behavioral: Positive for decreased concentration. Negative for behavioral problems (Depression), sleep disturbance and suicidal ideas.       Patient sees psychiatry for severe ADHD.   Today's Vitals   04/17/19 1511  BP: (!) 149/84  Pulse: 71  Resp: 16  SpO2: 98%  Weight: 156 lb (70.8 kg)  Height: 5\' 8"  (1.727 m)   Body mass index is 23.72 kg/m.  Physical Exam Vitals signs and nursing note reviewed.  Constitutional:      General: He is not in acute distress.    Appearance: Normal appearance. He is well-developed. He is not diaphoretic.  HENT:     Head: Normocephalic and  atraumatic.     Mouth/Throat:     Pharynx: No oropharyngeal exudate.  Eyes:     Pupils: Pupils are equal, round, and reactive to light.  Neck:     Musculoskeletal: Normal range of motion and neck supple.     Thyroid: No thyromegaly.     Vascular: No JVD.     Trachea: No tracheal deviation.  Cardiovascular:     Rate and Rhythm:  Normal rate and regular rhythm.     Heart sounds: Normal heart sounds. No murmur. No friction rub. No gallop.   Pulmonary:     Effort: Pulmonary effort is normal. No respiratory distress.     Breath sounds: Normal breath sounds. No wheezing or rales.  Chest:     Chest wall: No tenderness.  Abdominal:     General: Bowel sounds are normal.     Palpations: Abdomen is soft.     Tenderness: There is no abdominal tenderness.  Musculoskeletal: Normal range of motion.     Comments: Right shoulder tenderness.   Lymphadenopathy:     Cervical: No cervical adenopathy.  Skin:    General: Skin is warm and dry.  Neurological:     Mental Status: He is alert and oriented to person, place, and time.     Cranial Nerves: No cranial nerve deficit.  Psychiatric:        Behavior: Behavior normal.        Thought Content: Thought content normal.        Judgment: Judgment normal.    Assessment/Plan: 1. Acute pain of right shoulder Refer to orthopedics for further evaluation and treatment.  - Ambulatory referral to Orthopedic Surgery  2. Essential hypertension Stable. Continue bp medication as prescribed.   3. Mixed hyperlipidemia Stable.   4. Personal history of tobacco use, presenting hazards to health Recent low-dose CT scan of lungs negative. Will monitor yearly and as needed.   General Counseling: kolbie allenbaugh understanding of the findings of todays visit and agrees with plan of treatment. I have discussed any further diagnostic evaluation that may be needed or ordered today. We also reviewed his medications today. he has been encouraged to call the  office with any questions or concerns that should arise related to todays visit.  Hypertension Counseling:   The following hypertensive lifestyle modification were recommended and discussed:  1. Limiting alcohol intake to less than 1 oz/day of ethanol:(24 oz of beer or 8 oz of wine or 2 oz of 100-proof whiskey). 2. Take baby ASA 81 mg daily. 3. Importance of regular aerobic exercise and losing weight. 4. Reduce dietary saturated fat and cholesterol intake for overall cardiovascular health. 5. Maintaining adequate dietary potassium, calcium, and magnesium intake. 6. Regular monitoring of the blood pressure. 7. Reduce sodium intake to less than 100 mmol/day (less than 2.3 gm of sodium or less than 6 gm of sodium choride)   This patient was seen by Alsey with Dr [name] as a part of collaborative care agreement  Orders Placed This Encounter  Procedures  . Ambulatory referral to Orthopedic Surgery      Time spent: 25 Minutes      Dr [name] Internal medicine

## 2019-04-19 DIAGNOSIS — G8929 Other chronic pain: Secondary | ICD-10-CM | POA: Insufficient documentation

## 2019-04-19 DIAGNOSIS — M25511 Pain in right shoulder: Secondary | ICD-10-CM | POA: Insufficient documentation

## 2019-06-08 ENCOUNTER — Other Ambulatory Visit: Payer: Self-pay | Admitting: Adult Health

## 2019-06-08 DIAGNOSIS — E782 Mixed hyperlipidemia: Secondary | ICD-10-CM

## 2019-06-08 MED ORDER — SIMVASTATIN 40 MG PO TABS: 40.0000 mg | ORAL_TABLET | Freq: Every day | ORAL | 6 refills | Status: DC

## 2019-08-06 ENCOUNTER — Other Ambulatory Visit: Payer: Self-pay | Admitting: *Deleted

## 2019-08-06 DIAGNOSIS — N138 Other obstructive and reflux uropathy: Secondary | ICD-10-CM

## 2019-08-06 DIAGNOSIS — N401 Enlarged prostate with lower urinary tract symptoms: Secondary | ICD-10-CM

## 2019-08-10 ENCOUNTER — Other Ambulatory Visit: Payer: Medicaid Other

## 2019-08-10 ENCOUNTER — Other Ambulatory Visit: Payer: Self-pay

## 2019-08-10 DIAGNOSIS — N138 Other obstructive and reflux uropathy: Secondary | ICD-10-CM

## 2019-08-10 DIAGNOSIS — N401 Enlarged prostate with lower urinary tract symptoms: Secondary | ICD-10-CM

## 2019-08-11 LAB — PSA: Prostate Specific Ag, Serum: 0.4 ng/mL (ref 0.0–4.0)

## 2019-08-11 NOTE — Progress Notes (Signed)
08/08/2018 4:50 PM   Alex Bowers 12/03/1955 [id number]  Referring provider: [name], [place],  [place] [address]  Chief Complaint  Patient presents with  . Urinary Frequency    6 month follow up    HPI: 63 yo male with a history of urgency, BPH with LUTS, ED and history of hematuria  presents today for a 6 month f/u.    Urgency He could not tolerate Myrbetriq due to diarrhea.  He could not tolerate Toviaz 8mg  due to constipation.  His insurance would not cover the PTNS.  He was not a good candidate for Botox or Interstim.    BPH WITH LUTS  (prostate and/or bladder) IPSS score: 13/3    Previous score: 13/3    Major complaint(s):  Urgency, nocturia, weak stream and intermittency x several years, but it is getting worse since June.  Denies any dysuria, hematuria or suprapubic pain.   Currently taking: tadalafil 5 mg daily and finasteride 5 mg daily   His has had a cystoscopy in 2017 and was found to have impressive intraluminal bilobar hypertrophy.     Denies any recent fevers, chills, nausea or vomiting.  He does not have a family history of PCa.  IPSS    Row Name 08/12/19 1400         International Prostate Symptom Score   How often have you had the sensation of not emptying your bladder?  Less than half the time     How often have you had to urinate less than every two hours?  Less than half the time     How often have you found you stopped and started again several times when you urinated?  Less than half the time     How often have you found it difficult to postpone urination?  Less than half the time     How often have you had a weak urinary stream?  Less than half the time     How often have you had to strain to start urination?  Less than 1 in 5 times     How many times did you typically get up at night to urinate?  2 Times     Total IPSS Score  13       Quality of Life due to urinary symptoms   If you were to spend the rest  of your life with your urinary condition just the way it is now how would you feel about that?  Mixed        Score:  1-7 Mild 8-19 Moderate 20-35 Severe  Erectile dysfunction His SHIM score is 12, which is mild to moderate ED.   His previous SHIM score was 14.  He has been having difficulty with erections for several years.   He is having an occasional weak spontaneous erections.  He has no pain with erections or curvature with erections.  His major complaint is achieving an erections.  His libido is preserved.   His risk factors for ED are age, BPH, HTN, smoking and blood pressure medications.   SHIM    Row Name 08/12/19 1429         SHIM: Over the last 6 months:   How do you rate your confidence that you could get and keep an erection?  Low     When you had erections with sexual stimulation, how often were your erections hard enough for penetration (entering your partner)?  A  Few Times (much less than half the time)     During sexual intercourse, how often were you able to maintain your erection after you had penetrated (entered) your partner?  A Few Times (much less than half the time)     During sexual intercourse, how difficult was it to maintain your erection to completion of intercourse?  Difficult     When you attempted sexual intercourse, how often was it satisfactory for you?  Sometimes (about half the time)       SHIM Total Score   SHIM  12         Score: 1-7 Severe ED 8-11 Moderate ED 12-16 Mild-Moderate ED 17-21 Mild ED 22-25 No ED  History of hematuria  He completed a hematuria workup in 2017 and was found to have impressive intraluminal bilobar hypertrophy.  He has not seen any gross hematuria.  UA today is negative for AMH.    PMH: Past Medical History:  Diagnosis Date  . Hyperlipidemia   . Hypertension   . Personal history of tobacco use, presenting hazards to health 12/29/2015  . Stroke [facility]) 2014    Surgical History: Past Surgical History:    Procedure Laterality Date  . COLONOSCOPY  2015  . HERNIA REPAIR Right 06/09/14   inguinal   . teeth removal       Home Medications:  Allergies as of 08/12/2019   No Known Allergies     Medication List       Accurate as of August 12, 2019 11:59 PM. If you have any questions, ask your nurse or doctor.        amphetamine-dextroamphetamine 5 MG tablet Commonly known as: ADDERALL Take 5 mg by mouth daily.   Adderall XR 15 MG 24 hr capsule Generic drug: amphetamine-dextroamphetamine TAKE 1 TABLET IN AFTERNOON   aspirin EC 81 MG tablet Take 81 mg by mouth daily.   buPROPion 150 MG 12 hr tablet Commonly known as: WELLBUTRIN SR Take 150 mg by mouth daily.   citalopram 40 MG tablet Commonly known as: CELEXA Take 20 mg by mouth daily.   finasteride 5 MG tablet Commonly known as: PROSCAR TAKE 1 TABLET BY MOUTH EVERY DAY   FISH OIL + D3 PO Take by mouth.   lisinopril 10 MG tablet Commonly known as: ZESTRIL Take 1 tablet (10 mg total) by mouth daily.   MULTI VITAMIN MENS PO Take by mouth.   sildenafil 20 MG tablet Commonly known as: REVATIO Take 3 to 5 tablets two hours before intercouse on an empty stomach.  Do not take with nitrates.   simvastatin 40 MG tablet Commonly known as: ZOCOR Take 1 tablet (40 mg total) by mouth daily at 6 PM.   tadalafil 5 MG tablet Commonly known as: CIALIS Take 1 tablet (5 mg total) by mouth daily as needed for erectile dysfunction.   triamcinolone 0.025 % cream Commonly known as: KENALOG Apply 1 application topically 2 (two) times daily.   vitamin C 1000 MG tablet Take 1,000 mg by mouth 2 (two) times daily.       Allergies: No Known Allergies  Family History: Family History  Problem Relation Age of Onset  . Cancer - Other Brother        phageal  . Heart attack Father   . Colon polyps Mother   . Prostate cancer Neg Hx   . Kidney cancer Neg Hx   . Bladder Cancer Neg Hx     Social History:  reports that  he has  been smoking cigarettes. He has a 74.00 pack-year smoking history. He has never used smokeless tobacco. He reports current alcohol use. He reports that he does not use drugs.  ROS: UROLOGY Frequent Urination?: No Hard to postpone urination?: Yes Burning/pain with urination?: No Get up at night to urinate?: Yes Leakage of urine?: No Urine stream starts and stops?: Yes Trouble starting stream?: No Do you have to strain to urinate?: No Blood in urine?: No Urinary tract infection?: No Sexually transmitted disease?: No Injury to kidneys or bladder?: No Painful intercourse?: No Weak stream?: Yes Erection problems?: Yes Penile pain?: No  Gastrointestinal Nausea?: No Vomiting?: No Indigestion/heartburn?: No Diarrhea?: No Constipation?: No  Constitutional Fever: No Night sweats?: No Weight loss?: No Fatigue?: No  Skin Skin rash/lesions?: No Itching?: No  Eyes Blurred vision?: No Double vision?: No  Ears/Nose/Throat Sore throat?: No Sinus problems?: No  Hematologic/Lymphatic Swollen glands?: No Easy bruising?: No  Cardiovascular Leg swelling?: No Chest pain?: No  Respiratory Cough?: No Shortness of breath?: No  Endocrine Excessive thirst?: No  Musculoskeletal Back pain?: No Joint pain?: No  Neurological Headaches?: No Dizziness?: No  Psychologic Depression?: Yes Anxiety?: No  Physical Exam: BP (!) 138/94   Pulse 75   Ht 5\' 8"  (1.727 m)   Wt 158 lb (71.7 kg)   BMI 24.02 kg/m   Constitutional:  Well nourished. Alert and oriented, No acute distress. HEENT:  AT, moist mucus membranes.  Trachea midline, no masses. Cardiovascular: No clubbing, cyanosis, or edema. Respiratory: Normal respiratory effort, no increased work of breathing. GI: Abdomen is soft, non tender, non distended, no abdominal masses. Liver and spleen not palpable.  Left hernia appreciated.  Stool sample for occult testing is not indicated.   GU: No CVA tenderness.  No bladder  fullness or masses.  Patient with circumcised phallus. Urethral meatus is patent.  No penile discharge. No penile lesions or rashes. Scrotum without lesions, cysts, rashes and/or edema.  Testicles are located scrotally bilaterally. No masses are appreciated in the testicles. Left and right epididymis are normal. Rectal: Patient with  normal sphincter tone. Anus and perineum without scarring or rashes. No rectal masses are appreciated. Prostate is approximately 50 + grams, could only palpate apex and midportion, no nodules are appreciated. Seminal vesicles could not be palpated.  Skin: No rashes, bruises or suspicious lesions. Lymph: No inguinal adenopathy. Neurologic: Grossly intact, no focal deficits, moving all 4 extremities. Psychiatric: Normal mood and affect.  Laboratory Data: PSA History  1.0 ng/mL on 06/07/2015 - started on finasteride  0.6 ng/mL on 10/11/2016 (1.2)  0.7 ng/mL on 01/30/2018 (1.4)  0.5 ng/mL on 08/07/2018 (1.0)  0.4 ng/mL on 02/11/2019 (0.8)  0.4 ng/mL on 08/10/2019 (0.8)  Urinalysis Component     Latest Ref Rng & Units 08/12/2019  Specific Gravity, UA     1.005 - 1.030 1.020  pH, UA     5.0 - 7.5 7.0  Color, UA     Yellow Yellow  Appearance Ur     Clear Clear  Leukocytes,UA     Negative Trace (A)  Protein,UA     Negative/Trace Negative  Glucose, UA     Negative Negative  Ketones, UA     Negative Negative  RBC, UA     Negative Negative  Bilirubin, UA     Negative Negative  Urobilinogen, Ur     0.2 - 1.0 mg/dL 0.2  Nitrite, UA     Negative Negative  Microscopic Examination  See below:   Component     Latest Ref Rng & Units 08/12/2019  WBC, UA     0 - 5 /hpf 6-10 (A)  RBC     0 - 2 /hpf None seen  Epithelial Cells (non renal)     0 - 10 /hpf 0-10  Bacteria, UA     None seen/Few None seen   I have reviewed the labs.  Assessment & Plan:    1. Urgency Failed Myrbetriq - ADR of diarrhea and failed Toviaz 8 mg daily - ADR of  constipation Discussed PT and PTNS - insurance will not cover  Not a candidate for Botox or Interstim  2. BPH with LU TS I PSS 13/3, it is stable  Continue conservative management, avoiding bladder irritants and timed voiding's Urgency is most bothersome symptom - need to manage conservatively - see above  Continue finasteride 5 mg daily and Cialis 5 mg daily - refills given RTC in 6 months for I PSS, PSA and exam   3. Erectile dysfunction  SHIM score is 12, it is worsened Continue Cialis 5 mg daily  RTC in 6 months for SHIM and exam    4. History of hematuria Hematuria work up completed in 08/2015 - findings positive for friable enlarged prostate - continue finasteride No report of gross hematuria  UA today is negative for [facility] RTC in one year for UA - patient to report any gross hematuria in the interim    Return in about 6 months (around 02/10/2020) for IPSS, SHIM, PSA and exam.  These notes generated with voice recognition software. I apologize for typographical errors.  Zara Council, PA-C  [facility] [address] [place], [place] [address] [phone]

## 2019-08-12 ENCOUNTER — Ambulatory Visit (INDEPENDENT_AMBULATORY_CARE_PROVIDER_SITE_OTHER): Payer: Medicaid Other | Admitting: Urology

## 2019-08-12 ENCOUNTER — Other Ambulatory Visit: Payer: Self-pay

## 2019-08-12 ENCOUNTER — Encounter: Payer: Self-pay | Admitting: Urology

## 2019-08-12 VITALS — BP 138/94 | HR 75 | Ht 68.0 in | Wt 158.0 lb

## 2019-08-12 DIAGNOSIS — N401 Enlarged prostate with lower urinary tract symptoms: Secondary | ICD-10-CM | POA: Diagnosis not present

## 2019-08-12 DIAGNOSIS — Z87448 Personal history of other diseases of urinary system: Secondary | ICD-10-CM

## 2019-08-12 DIAGNOSIS — N529 Male erectile dysfunction, unspecified: Secondary | ICD-10-CM | POA: Diagnosis not present

## 2019-08-12 DIAGNOSIS — N138 Other obstructive and reflux uropathy: Secondary | ICD-10-CM

## 2019-08-12 DIAGNOSIS — R3915 Urgency of urination: Secondary | ICD-10-CM

## 2019-08-12 LAB — URINALYSIS, COMPLETE
Bilirubin, UA: NEGATIVE
Glucose, UA: NEGATIVE
Ketones, UA: NEGATIVE
Nitrite, UA: NEGATIVE
Protein,UA: NEGATIVE
RBC, UA: NEGATIVE
Specific Gravity, UA: 1.02 (ref 1.005–1.030)
Urobilinogen, Ur: 0.2 mg/dL (ref 0.2–1.0)
pH, UA: 7 (ref 5.0–7.5)

## 2019-08-12 LAB — MICROSCOPIC EXAMINATION
Bacteria, UA: NONE SEEN
RBC, Urine: NONE SEEN /hpf (ref 0–2)

## 2019-08-12 MED ORDER — TADALAFIL 5 MG PO TABS: 5.0000 mg | ORAL_TABLET | Freq: Every day | ORAL | 6 refills | Status: DC | PRN

## 2019-09-25 ENCOUNTER — Other Ambulatory Visit: Payer: Self-pay

## 2019-09-25 DIAGNOSIS — I10 Essential (primary) hypertension: Secondary | ICD-10-CM

## 2019-09-25 MED ORDER — LISINOPRIL 10 MG PO TABS: 10.0000 mg | ORAL_TABLET | Freq: Every day | ORAL | 3 refills | Status: DC

## 2019-10-21 ENCOUNTER — Telehealth: Payer: Self-pay

## 2019-10-21 NOTE — Telephone Encounter (Signed)
[name] FOR PATIENT TO SCREEN AND CONFIRM 10-23-19 OV.

## 2019-10-23 ENCOUNTER — Ambulatory Visit: Payer: Medicaid Other | Admitting: Nurse Practitioner

## 2019-12-10 ENCOUNTER — Telehealth: Payer: Self-pay

## 2019-12-10 NOTE — Telephone Encounter (Signed)
[name] to confirm and screen for 12-14-19 ov.

## 2019-12-14 ENCOUNTER — Other Ambulatory Visit: Payer: Self-pay

## 2019-12-14 ENCOUNTER — Encounter: Payer: Self-pay | Admitting: Nurse Practitioner

## 2019-12-14 ENCOUNTER — Ambulatory Visit: Payer: Medicaid Other | Admitting: Nurse Practitioner

## 2019-12-14 VITALS — BP 137/93 | HR 76 | Temp 97.5°F | Resp 16 | Ht 68.0 in | Wt 150.0 lb

## 2019-12-14 DIAGNOSIS — F17210 Nicotine dependence, cigarettes, uncomplicated: Secondary | ICD-10-CM | POA: Diagnosis not present

## 2019-12-14 DIAGNOSIS — E782 Mixed hyperlipidemia: Secondary | ICD-10-CM | POA: Diagnosis not present

## 2019-12-14 DIAGNOSIS — I10 Essential (primary) hypertension: Secondary | ICD-10-CM | POA: Diagnosis not present

## 2019-12-14 NOTE — Progress Notes (Signed)
[facility] [place], [place] [address]  Internal MEDICINE  Office Visit Note  Patient Name: Alex Bowers  [id number]  [id number]  Date of Service: 12/14/2019  Chief Complaint  Patient presents with  . Hypertension  . Hyperlipidemia    The patient is here for routine follow up. He is doing well. Blood pressure is well managed overall. He has been staying with his father in [place], taking care of him since his dad had a stroke in December. It is difficult getting back and forth to appointments and staying with his dad in [place]. He continues to see behavioral health in [employer] and will see them again next week.       Current Medication: Outpatient Encounter Medications as of 12/14/2019  Medication Sig  . ADDERALL XR 15 MG 24 hr capsule TAKE 1 TABLET IN AFTERNOON  . amphetamine-dextroamphetamine (ADDERALL) 5 MG tablet Take 5 mg by mouth daily.  . Ascorbic Acid (VITAMIN C) 1000 MG tablet Take 1,000 mg by mouth 2 (two) times daily.  Marland Kitchen aspirin EC 81 MG tablet Take 81 mg by mouth daily.  Marland Kitchen buPROPion (WELLBUTRIN SR) 150 MG 12 hr tablet Take 150 mg by mouth daily.  . citalopram (CELEXA) 40 MG tablet Take 20 mg by mouth daily.   . finasteride (PROSCAR) 5 MG tablet TAKE 1 TABLET BY MOUTH EVERY DAY  . Fish Oil-Cholecalciferol (FISH OIL + D3 PO) Take by mouth.  Marland Kitchen lisinopril (ZESTRIL) 10 MG tablet Take 1 tablet (10 mg total) by mouth daily.  . Multiple Vitamin (MULTI VITAMIN MENS PO) Take by mouth.  . sildenafil (REVATIO) 20 MG tablet Take 3 to 5 tablets two hours before intercouse on an empty stomach.  Do not take with nitrates.  . simvastatin (ZOCOR) 40 MG tablet Take 1 tablet (40 mg total) by mouth daily at 6 PM.  . tadalafil (CIALIS) 5 MG tablet Take 1 tablet (5 mg total) by mouth daily as needed for erectile dysfunction.  . triamcinolone (KENALOG) 0.025 % cream Apply 1 application topically 2 (two) times daily.   No facility-administered  encounter medications on file as of 12/14/2019.    Surgical History: Past Surgical History:  Procedure Laterality Date  . COLONOSCOPY  2015  . HERNIA REPAIR Right 06/09/14   inguinal   . teeth removal       Medical History: Past Medical History:  Diagnosis Date  . Hyperlipidemia   . Hypertension   . Personal history of tobacco use, presenting hazards to health 12/29/2015  . Stroke [facility]) 2014    Family History: Family History  Problem Relation Age of Onset  . Cancer - Other Brother        phageal  . Heart attack Father   . Colon polyps Mother   . Prostate cancer Neg Hx   . Kidney cancer Neg Hx   . Bladder Cancer Neg Hx     Social History   Socioeconomic History  . Marital status: Divorced    Spouse name: Not on file  . Number of children: Not on file  . Years of education: Not on file  . Highest education level: Not on file  Occupational History  . Not on file  Tobacco Use  . Smoking status: Current Every Day Smoker    Packs/day: 2.00    Years: 37.00    Pack years: 74.00    Types: Cigarettes  . Smokeless tobacco: Never Used  Substance and Sexual Activity  . Alcohol use:  Yes    Comment: occasionally  . Drug use: No  . Sexual activity: Not on file  Other Topics Concern  . Not on file  Social History Narrative  . Not on file   Social Determinants of Health   Financial Resource Strain:   . Difficulty of Paying Living Expenses:   Food Insecurity:   . Worried About Charity fundraiser in the Last Year:   . Arboriculturist in the Last Year:   Transportation Needs:   . Film/video editor (Medical):   Marland Kitchen Lack of Transportation (Non-Medical):   Physical Activity:   . Days of Exercise per Week:   . Minutes of Exercise per Session:   Stress:   . Feeling of Stress :   Social Connections:   . Frequency of Communication with Friends and Family:   . Frequency of Social Gatherings with Friends and Family:   . Attends Religious Services:   . Active Member  of Clubs or Organizations:   . Attends Archivist Meetings:   Marland Kitchen Marital Status:   Intimate Partner Violence:   . Fear of Current or Ex-Partner:   . Emotionally Abused:   Marland Kitchen Physically Abused:   . Sexually Abused:       Review of Systems  Constitutional: Negative for chills, fatigue and unexpected weight change.  HENT: Negative for congestion, postnasal drip, rhinorrhea, sneezing and sore throat.   Respiratory: Negative for cough, chest tightness, shortness of breath and wheezing.   Cardiovascular: Negative for chest pain and palpitations.       Blood pressure is well managed at this time.   Gastrointestinal: Negative for abdominal pain, constipation, diarrhea, nausea and vomiting.  Endocrine: Negative for cold intolerance, heat intolerance, polydipsia and polyuria.  Genitourinary: Positive for frequency.       The patient sees urology for problems with BPH  Musculoskeletal: Positive for arthralgias and myalgias. Negative for back pain, joint swelling and neck pain.       Right shoulder pain . Re injured his right shoulder when moving into his fathers home.   Skin: Negative for rash.  Allergic/Immunologic: Negative for environmental allergies.  Neurological: Negative for dizziness, tremors, numbness and headaches.  Hematological: Negative for adenopathy. Does not bruise/bleed easily.  Psychiatric/Behavioral: Positive for decreased concentration. Negative for behavioral problems (Depression), sleep disturbance and suicidal ideas.       Patient sees psychiatry for severe ADHD.    Today's Vitals   12/14/19 1516  BP: (!) 137/93  Pulse: 76  Resp: 16  Temp: (!) 97.5 F (36.4 C)  SpO2: 98%  Weight: 150 lb (68 kg)  Height: 5\' 8"  (1.727 m)   Body mass index is 22.81 kg/m.  Physical Exam Vitals and nursing note reviewed.  Constitutional:      General: He is not in acute distress.    Appearance: Normal appearance. He is well-developed. He is not diaphoretic.  HENT:      Head: Normocephalic and atraumatic.     Mouth/Throat:     Pharynx: No oropharyngeal exudate.  Eyes:     Pupils: Pupils are equal, round, and reactive to light.  Neck:     Thyroid: No thyromegaly.     Vascular: No JVD.     Trachea: No tracheal deviation.  Cardiovascular:     Rate and Rhythm: Normal rate and regular rhythm.     Heart sounds: Normal heart sounds. No murmur. No friction rub. No gallop.   Pulmonary:  Effort: Pulmonary effort is normal. No respiratory distress.     Breath sounds: Normal breath sounds. No wheezing or rales.  Chest:     Chest wall: No tenderness.  Abdominal:     Palpations: Abdomen is soft.  Musculoskeletal:        General: Normal range of motion.     Cervical back: Normal range of motion and neck supple.  Lymphadenopathy:     Cervical: No cervical adenopathy.  Skin:    General: Skin is warm and dry.  Neurological:     Mental Status: He is alert and oriented to person, place, and time.     Cranial Nerves: No cranial nerve deficit.  Psychiatric:        Behavior: Behavior normal.        Thought Content: Thought content normal.        Judgment: Judgment normal.    Assessment/Plan: 1. Essential hypertension Stable. Continue bp medication as prescribed   2. Mixed hyperlipidemia Check fasting lipid panel prior to next visit. Adjust cholesterol medication as indicated.   3. Cigarette smoker Patient states he is smoking more since taking care of his father. Not ready to quit at this time.   General Counseling: fabien renda understanding of the findings of todays visit and agrees with plan of treatment. I have discussed any further diagnostic evaluation that may be needed or ordered today. We also reviewed his medications today. he has been encouraged to call the office with any questions or concerns that should arise related to todays visit.   This patient was seen by Leretha Pol FNP Collaboration with Dr [name] as a part of  collaborative care agreement  Total time spent: 25 Minutes   Time spent includes review of chart, medications, test results, and follow up plan with the patient.      Dr [name] Internal medicine

## 2020-01-06 ENCOUNTER — Other Ambulatory Visit: Payer: Self-pay | Admitting: Adult Health

## 2020-01-06 DIAGNOSIS — E782 Mixed hyperlipidemia: Secondary | ICD-10-CM

## 2020-01-28 ENCOUNTER — Other Ambulatory Visit: Payer: Self-pay

## 2020-01-28 ENCOUNTER — Other Ambulatory Visit: Payer: Self-pay | Admitting: Urology

## 2020-01-28 DIAGNOSIS — N401 Enlarged prostate with lower urinary tract symptoms: Secondary | ICD-10-CM

## 2020-01-28 DIAGNOSIS — I10 Essential (primary) hypertension: Secondary | ICD-10-CM

## 2020-01-28 MED ORDER — LISINOPRIL 10 MG PO TABS: 10.0000 mg | ORAL_TABLET | Freq: Every day | ORAL | 3 refills | Status: DC

## 2020-02-09 ENCOUNTER — Other Ambulatory Visit: Payer: Medicaid Other

## 2020-02-09 ENCOUNTER — Other Ambulatory Visit: Payer: Self-pay | Admitting: *Deleted

## 2020-02-09 DIAGNOSIS — N138 Other obstructive and reflux uropathy: Secondary | ICD-10-CM

## 2020-02-11 ENCOUNTER — Ambulatory Visit: Payer: Medicaid Other | Admitting: Urology

## 2020-02-15 ENCOUNTER — Other Ambulatory Visit: Payer: Medicaid Other

## 2020-02-15 ENCOUNTER — Other Ambulatory Visit: Payer: Self-pay

## 2020-02-15 DIAGNOSIS — N401 Enlarged prostate with lower urinary tract symptoms: Secondary | ICD-10-CM

## 2020-02-16 LAB — PSA: Prostate Specific Ag, Serum: 0.4 ng/mL (ref 0.0–4.0)

## 2020-02-16 NOTE — Progress Notes (Signed)
08/08/2018 11:50 AM   Alex Bowers [birth year] [id number]  Referring provider: [name], [address] [place],  [place] [address]  Chief Complaint  Patient presents with  . Urinary Incontinence    HPI: Alex Bowers is a 64 y.o. male with a history of urgency, BPH with LUTS, ED and history of hematuria  presents today for a 6 month f/u.    Urgency He could not tolerate Myrbetriq due to diarrhea.  He could not tolerate Toviaz 8mg  due to constipation.  His insurance would not cover the PTNS.  He was not a good candidate for Botox or Interstim.    BPH WITH LUTS  (prostate and/or bladder) IPSS score: 12/3    Previous score: 13/3    Major complaint(s):  Urgency and weak stream  x several years.  Denies any dysuria, hematuria or suprapubic pain.   Currently taking: tadalafil 5 mg daily and finasteride 5 mg daily   His has had a cystoscopy in 2017 and was found to have impressive intraluminal bilobar hypertrophy.     Denies any recent fevers, chills, nausea or vomiting.  He does not have a family history of PCa.   IPSS    Row Name 02/17/20 1400         International Prostate Symptom Score   How often have you had the sensation of not emptying your bladder? Less than half the time     How often have you had to urinate less than every two hours? Less than half the time     How often have you found you stopped and started again several times when you urinated? Less than half the time     How often have you found it difficult to postpone urination? Less than half the time     How often have you had a weak urinary stream? Less than half the time     How often have you had to strain to start urination? Not at All     How many times did you typically get up at night to urinate? 2 Times     Total IPSS Score 12       Quality of Life due to urinary symptoms   If you were to spend the rest of your life with your urinary condition just the way it is now how  would you feel about that? Mixed            Score:  1-7 Mild 8-19 Moderate 20-35 Severe  Erectile dysfunction His SHIM score is 15, which is mild to moderate ED.   His previous SHIM score was 12.  He has been having difficulty with erections for several years.   He is having an occasional weak spontaneous erections.  He has no pain with erections or curvature with erections.  His major complaint is achieving an erections.  His libido is preserved.   His risk factors for ED are age, BPH, HTN, smoking and blood pressure medications.    SHIM    Row Name 02/17/20 1420         SHIM: Over the last 6 months:   How do you rate your confidence that you could get and keep an erection? Moderate     When you had erections with sexual stimulation, how often were your erections hard enough for penetration (entering your partner)? Sometimes (about half the time)     During sexual intercourse, how often were you able to maintain  your erection after you had penetrated (entered) your partner? Sometimes (about half the time)     During sexual intercourse, how difficult was it to maintain your erection to completion of intercourse? Difficult     When you attempted sexual intercourse, how often was it satisfactory for you? Sometimes (about half the time)       SHIM Total Score   SHIM 15             Score: 1-7 Severe ED 8-11 Moderate ED 12-16 Mild-Moderate ED 17-21 Mild ED 22-25 No ED  History of hematuria (high risk) He completed a hematuria workup in 2017 and was found to have impressive intraluminal bilobar hypertrophy.  He has not had any gross hematuria.  His UA today is negative for micro heme.   PMH: Past Medical History:  Diagnosis Date  . Hyperlipidemia   . Hypertension   . Personal history of tobacco use, presenting hazards to health 12/29/2015  . Stroke [facility]) 2014    Surgical History: Past Surgical History:  Procedure Laterality Date  . COLONOSCOPY  2015  . HERNIA REPAIR  Right 06/09/14   inguinal   . teeth removal       Home Medications:  Allergies as of 02/17/2020   No Known Allergies     Medication List       Accurate as of February 17, 2020 11:59 PM. If you have any questions, ask your nurse or doctor.        amphetamine-dextroamphetamine 5 MG tablet Commonly known as: ADDERALL Take 5 mg by mouth daily.   Adderall XR 15 MG 24 hr capsule Generic drug: amphetamine-dextroamphetamine TAKE 1 TABLET IN AFTERNOON   aspirin EC 81 MG tablet Take 81 mg by mouth daily.   buPROPion 150 MG 12 hr tablet Commonly known as: WELLBUTRIN SR Take 150 mg by mouth daily.   citalopram 40 MG tablet Commonly known as: CELEXA Take 20 mg by mouth daily.   finasteride 5 MG tablet Commonly known as: PROSCAR Take 1 tablet (5 mg total) by mouth daily.   FISH OIL + D3 PO Take by mouth.   lisinopril 10 MG tablet Commonly known as: ZESTRIL Take 1 tablet (10 mg total) by mouth daily.   MULTI VITAMIN MENS PO Take by mouth.   sildenafil 20 MG tablet Commonly known as: REVATIO Take 3 to 5 tablets two hours before intercouse on an empty stomach.  Do not take with nitrates.   simvastatin 40 MG tablet Commonly known as: ZOCOR TAKE 1 TABLET (40 MG TOTAL) BY MOUTH DAILY AT 6 PM.   tadalafil 5 MG tablet Commonly known as: CIALIS Take 1 tablet (5 mg total) by mouth daily as needed for erectile dysfunction.   triamcinolone 0.025 % cream Commonly known as: KENALOG Apply 1 application topically 2 (two) times daily.   vitamin C 1000 MG tablet Take 1,000 mg by mouth 2 (two) times daily.       Allergies: No Known Allergies  Family History: Family History  Problem Relation Age of Onset  . Cancer - Other Brother        phageal  . Heart attack Father   . Colon polyps Mother   . Prostate cancer Neg Hx   . Kidney cancer Neg Hx   . Bladder Cancer Neg Hx     Social History:  reports that he has been smoking cigarettes. He has a 74.00 pack-year smoking  history. He has never used smokeless tobacco. He reports current alcohol  use. He reports that he does not use drugs.  ROS: For pertinent review of systems please refer to history of present illness  Physical Exam: BP (!) 144/87   Pulse 78   Ht 5\' 8"  (1.727 m)   Wt 150 lb (68 kg)   BMI 22.81 kg/m   Constitutional:  Well nourished. Alert and oriented, No acute distress. HEENT: [place] AT, mask in place.  Trachea midline Cardiovascular: No clubbing, cyanosis, or edema. Respiratory: Normal respiratory effort, no increased work of breathing. GI: Abdomen is soft, non tender, non distended, no abdominal masses. Liver and spleen not palpable.  Left inguinal hernia appreciated.  Stool sample for occult testing is not indicated.   GU: No CVA tenderness.  No bladder fullness or masses.  Patient with circumcised phallus.  Urethral meatus is patent.  No penile discharge. No penile lesions or rashes. Scrotum without lesions, cysts, rashes and/or edema.  Testicles are located scrotally bilaterally. No masses are appreciated in the testicles. Left and right epididymis are normal. Rectal: Patient with  normal sphincter tone. Anus and perineum without scarring or rashes. No rectal masses are appreciated. Prostate is approximately 50 grams, no nodules are appreciated. Seminal vesicles could not be palpated Skin: No rashes, bruises or suspicious lesions. Lymph: No inguinal adenopathy. Neurologic: Grossly intact, no focal deficits, moving all 4 extremities. Psychiatric: Normal mood and affect.  Laboratory Data: PSA History  1.0 ng/mL on 06/07/2015 - started on finasteride  Component     Latest Ref Rng & Units 10/11/2016 01/30/2018 08/07/2018 02/11/2019  Prostate Specific Ag, Serum     0.0 - 4.0 ng/mL 0.6 0.7 0.5 0.4   Component     Latest Ref Rng & Units 04/09/2019 08/10/2019 02/15/2020  Prostate Specific Ag, Serum     0.0 - 4.0 ng/mL 0.4 0.4 0.4   Corrected PSA 0.8 Urinalysis  Component     Latest Ref Rng  & Units 02/17/2020  Specific Gravity, UA     1.005 - 1.030 >1.030 (H)  pH, UA     5.0 - 7.5 5.0  Color, UA     Yellow Yellow  Appearance Ur     Clear Clear  Leukocytes,UA     Negative Negative  Protein,UA     Negative/Trace Negative  Glucose, UA     Negative Negative  Ketones, UA     Negative 1+ (A)  RBC, UA     Negative Negative  Bilirubin, UA     Negative Negative  Urobilinogen, Ur     0.2 - 1.0 mg/dL 0.2  Nitrite, UA     Negative Negative  Microscopic Examination      See below:   Component     Latest Ref Rng & Units 02/17/2020  WBC, UA     0 - 5 /hpf 0-5  RBC     0 - 2 /hpf 0-2  Epithelial Cells (non renal)     0 - 10 /hpf 0-10  Casts     None seen /lpf Present (A)  Cast Type     N/A Hyaline casts  Bacteria, UA     None seen/Few None seen   I have reviewed the labs.  Assessment & Plan:    1. Urgency Failed Myrbetriq - ADR of diarrhea and failed Toviaz 8 mg daily - ADR of constipation Discussed PT and PTNS - insurance will not cover  Not a candidate for Botox or Interstim  2. BPH with LU TS I PSS 12/3, it is improved  Continue conservative management, avoiding bladder irritants and timed voiding's Urgency is most bothersome symptom - need to manage conservatively - see above  Continue finasteride 5 mg daily and Cialis 5 mg daily - refills given RTC in 12 months for I PSS, PSA and exam   3. Erectile dysfunction  SHIM score is 15, it is improved Continue Cialis 5 mg daily  RTC in 12 months for SHIM and exam    4. History of hematuria Hematuria work up completed in 08/2015 - findings positive for friable enlarged prostate - continue finasteride No report of gross hematuria  UA today is negative for [facility] RTC in one year for UA - patient to report any gross hematuria in the interim    Return in about 1 year (around 02/16/2021) for IPSS, SHIM, PSA and exam.  These notes generated with voice recognition software. I apologize for typographical  errors.  Zara Council, PA-C  [facility] [address] [place], [place] [address] [phone]

## 2020-02-17 ENCOUNTER — Other Ambulatory Visit: Payer: Self-pay

## 2020-02-17 ENCOUNTER — Ambulatory Visit (INDEPENDENT_AMBULATORY_CARE_PROVIDER_SITE_OTHER): Payer: Medicaid Other | Admitting: Urology

## 2020-02-17 ENCOUNTER — Encounter: Payer: Self-pay | Admitting: Urology

## 2020-02-17 VITALS — BP 144/87 | HR 78 | Ht 68.0 in | Wt 150.0 lb

## 2020-02-17 DIAGNOSIS — N401 Enlarged prostate with lower urinary tract symptoms: Secondary | ICD-10-CM | POA: Diagnosis not present

## 2020-02-17 DIAGNOSIS — N529 Male erectile dysfunction, unspecified: Secondary | ICD-10-CM

## 2020-02-17 DIAGNOSIS — N138 Other obstructive and reflux uropathy: Secondary | ICD-10-CM

## 2020-02-17 DIAGNOSIS — R3915 Urgency of urination: Secondary | ICD-10-CM | POA: Diagnosis not present

## 2020-02-17 DIAGNOSIS — Z87448 Personal history of other diseases of urinary system: Secondary | ICD-10-CM | POA: Diagnosis not present

## 2020-02-17 MED ORDER — FINASTERIDE 5 MG PO TABS: 5.0000 mg | ORAL_TABLET | Freq: Every day | ORAL | 3 refills | Status: DC

## 2020-02-17 MED ORDER — TADALAFIL 5 MG PO TABS: 5.0000 mg | ORAL_TABLET | Freq: Every day | ORAL | 11 refills | Status: DC | PRN

## 2020-02-18 LAB — URINALYSIS, COMPLETE
Bilirubin, UA: NEGATIVE
Glucose, UA: NEGATIVE
Leukocytes,UA: NEGATIVE
Nitrite, UA: NEGATIVE
Protein,UA: NEGATIVE
RBC, UA: NEGATIVE
Specific Gravity, UA: >1.03 — ABNORMAL HIGH (ref 1.005–1.030)
Urobilinogen, Ur: 0.2 mg/dL (ref 0.2–1.0)
pH, UA: 5 (ref 5.0–7.5)

## 2020-02-18 LAB — MICROSCOPIC EXAMINATION: Bacteria, UA: NONE SEEN

## 2020-02-23 ENCOUNTER — Telehealth: Payer: Self-pay

## 2020-02-23 NOTE — Telephone Encounter (Signed)
Message left notifying patient that it is time to schedule the low dose lung cancer screening CT scan.  Instructed patient to return call to Shawn Perkins at [phone] to verify information prior to CT scan being scheduled.    

## 2020-03-14 ENCOUNTER — Telehealth: Payer: Self-pay

## 2020-03-14 NOTE — Telephone Encounter (Signed)
Message left notifying patient that it is time to schedule the low dose lung cancer screening CT scan.  Instructed patient to return call to Shawn Perkins at [phone] to verify information prior to CT scan being scheduled.    

## 2020-03-28 ENCOUNTER — Other Ambulatory Visit: Payer: Self-pay

## 2020-03-28 DIAGNOSIS — I10 Essential (primary) hypertension: Secondary | ICD-10-CM

## 2020-03-28 MED ORDER — LISINOPRIL 10 MG PO TABS: 10.0000 mg | ORAL_TABLET | Freq: Every day | ORAL | 3 refills | Status: DC

## 2020-04-11 ENCOUNTER — Other Ambulatory Visit: Payer: Self-pay | Admitting: Nurse Practitioner

## 2020-04-12 LAB — COMPREHENSIVE METABOLIC PANEL
ALT: 13 IU/L (ref 0–44)
AST: 18 IU/L (ref 0–40)
Albumin/Globulin Ratio: 1.7 (ref 1.2–2.2)
Albumin: 4 g/dL (ref 3.8–4.8)
Alkaline Phosphatase: 63 IU/L (ref 48–121)
BUN/Creatinine Ratio: 12 (ref 10–24)
BUN: 10 mg/dL (ref 8–27)
Bilirubin Total: 0.5 mg/dL (ref 0.0–1.2)
CO2: 26 mmol/L (ref 20–29)
Calcium: 9.2 mg/dL (ref 8.6–10.2)
Chloride: 103 mmol/L (ref 96–106)
Creatinine, Ser: 0.84 mg/dL (ref 0.76–1.27)
GFR calc Af Amer: 108 mL/min/{1.73_m2} (ref >59)
GFR calc non Af Amer: 93 mL/min/{1.73_m2} (ref >59)
Globulin, Total: 2.4 g/dL (ref 1.5–4.5)
Glucose: 106 mg/dL — ABNORMAL HIGH (ref 65–99)
Potassium: 4.6 mmol/L (ref 3.5–5.2)
Sodium: 140 mmol/L (ref 134–144)
Total Protein: 6.4 g/dL (ref 6.0–8.5)

## 2020-04-12 LAB — LIPID PANEL WITH LDL/HDL RATIO
Cholesterol, Total: 149 mg/dL (ref 100–199)
HDL: 70 mg/dL (ref >39)
LDL Chol Calc (NIH): 69 mg/dL (ref 0–99)
LDL/HDL Ratio: 1 ratio (ref 0.0–3.6)
Triglycerides: 42 mg/dL (ref 0–149)
VLDL Cholesterol Cal: 10 mg/dL (ref 5–40)

## 2020-04-12 LAB — CBC
Hematocrit: 48.6 % (ref 37.5–51.0)
Hemoglobin: 16.2 g/dL (ref 13.0–17.7)
MCH: 33.8 pg — ABNORMAL HIGH (ref 26.6–33.0)
MCHC: 33.3 g/dL (ref 31.5–35.7)
MCV: 102 fL — ABNORMAL HIGH (ref 79–97)
Platelets: 242 10*3/uL (ref 150–450)
RBC: 4.79 x10E6/uL (ref 4.14–5.80)
RDW: 11.7 % (ref 11.6–15.4)
WBC: 7.3 10*3/uL (ref 3.4–10.8)

## 2020-04-12 LAB — HCV AB W REFLEX TO QUANT PCR: HCV Ab: <0.1 s/co ratio (ref 0.0–0.9)

## 2020-04-12 LAB — T4, FREE: Free T4: 1.24 ng/dL (ref 0.82–1.77)

## 2020-04-12 LAB — TSH: TSH: 1.38 u[IU]/mL (ref 0.450–4.500)

## 2020-04-12 LAB — HCV INTERPRETATION

## 2020-04-14 ENCOUNTER — Other Ambulatory Visit: Payer: Self-pay | Admitting: *Deleted

## 2020-04-14 DIAGNOSIS — Z122 Encounter for screening for malignant neoplasm of respiratory organs: Secondary | ICD-10-CM

## 2020-04-14 DIAGNOSIS — Z87891 Personal history of nicotine dependence: Secondary | ICD-10-CM

## 2020-04-15 ENCOUNTER — Other Ambulatory Visit: Payer: Self-pay

## 2020-04-15 ENCOUNTER — Ambulatory Visit: Payer: Medicaid Other | Admitting: Nurse Practitioner

## 2020-04-15 ENCOUNTER — Encounter: Payer: Self-pay | Admitting: Nurse Practitioner

## 2020-04-15 VITALS — BP 142/90 | HR 84 | Temp 97.9°F | Resp 16 | Ht 68.0 in | Wt 147.0 lb

## 2020-04-15 DIAGNOSIS — M25511 Pain in right shoulder: Secondary | ICD-10-CM

## 2020-04-15 DIAGNOSIS — I700 Atherosclerosis of aorta: Secondary | ICD-10-CM

## 2020-04-15 DIAGNOSIS — I10 Essential (primary) hypertension: Secondary | ICD-10-CM | POA: Diagnosis not present

## 2020-04-15 DIAGNOSIS — R300 Dysuria: Secondary | ICD-10-CM

## 2020-04-15 DIAGNOSIS — Z0001 Encounter for general adult medical examination with abnormal findings: Secondary | ICD-10-CM

## 2020-04-15 DIAGNOSIS — E782 Mixed hyperlipidemia: Secondary | ICD-10-CM | POA: Diagnosis not present

## 2020-04-15 MED ORDER — MELOXICAM 7.5 MG PO TABS: 7.5000 mg | ORAL_TABLET | Freq: Two times a day (BID) | ORAL | 2 refills | Status: DC | PRN

## 2020-04-15 NOTE — Progress Notes (Signed)
[facility] [place], [place] [address]  Internal MEDICINE  Office Visit Note  Patient Name: Alex Bowers  [id number]  [id number]  Date of Service: 05/01/2020  Chief Complaint  Patient presents with  . Annual Exam    right shoulder pain  . Hypertension  . Hyperlipidemia  . Quality Metric Gaps    HIV screening, TDAP     The patient is here for health maintenance exam.  -right shoulder pain. Got worse a few months ago after lifting heavy box. Hurts posterior aspect of his right shoulder. Hurts more with movement. Has had problems with this shoulder before. Had x-ray in 04/2019 which did show early spur formation posterior to the acromion.  -blood pressure slightly elevated upon arrival but generally well managed.  -routine, fasting labs done prior to this visit. All looked good.   Pt is here for routine health maintenance examination  Current Medication: Outpatient Encounter Medications as of 04/15/2020  Medication Sig  . ADDERALL XR 15 MG 24 hr capsule TAKE 1 TABLET IN AFTERNOON  . amphetamine-dextroamphetamine (ADDERALL) 5 MG tablet Take 5 mg by mouth daily.  Marland Kitchen aspirin EC 81 MG tablet Take 81 mg by mouth daily.  Marland Kitchen buPROPion (WELLBUTRIN SR) 150 MG 12 hr tablet Take 150 mg by mouth daily.  . citalopram (CELEXA) 40 MG tablet Take 20 mg by mouth daily.   . finasteride (PROSCAR) 5 MG tablet Take 1 tablet (5 mg total) by mouth daily.  . sildenafil (REVATIO) 20 MG tablet Take 3 to 5 tablets two hours before intercouse on an empty stomach.  Do not take with nitrates.  . tadalafil (CIALIS) 5 MG tablet Take 1 tablet (5 mg total) by mouth daily as needed for erectile dysfunction.  . [DISCONTINUED] lisinopril (ZESTRIL) 10 MG tablet Take 1 tablet (10 mg total) by mouth daily.  . [DISCONTINUED] simvastatin (ZOCOR) 40 MG tablet TAKE 1 TABLET (40 MG TOTAL) BY MOUTH DAILY AT 6 PM.  . Ascorbic Acid (VITAMIN C) 1000 MG tablet Take 1,000 mg by mouth 2 (two)  times daily. (Patient not taking: Reported on 04/15/2020)  . Fish Oil-Cholecalciferol (FISH OIL + D3 PO) Take by mouth. (Patient not taking: Reported on 04/15/2020)  . meloxicam (MOBIC) 7.5 MG tablet Take 1 tablet (7.5 mg total) by mouth 2 (two) times daily as needed for pain.  . Multiple Vitamin (MULTI VITAMIN MENS PO) Take by mouth. (Patient not taking: Reported on 04/15/2020)  . triamcinolone (KENALOG) 0.025 % cream Apply 1 application topically 2 (two) times daily. (Patient not taking: Reported on 04/15/2020)   No facility-administered encounter medications on file as of 04/15/2020.    Surgical History: Past Surgical History:  Procedure Laterality Date  . COLONOSCOPY  2015  . HERNIA REPAIR Right 06/09/14   inguinal   . teeth removal       Medical History: Past Medical History:  Diagnosis Date  . Hyperlipidemia   . Hypertension   . Personal history of tobacco use, presenting hazards to health 12/29/2015  . Stroke [facility]) 2014    Family History: Family History  Problem Relation Age of Onset  . Cancer - Other Brother        phageal  . Heart attack Father   . Colon polyps Mother   . Prostate cancer Neg Hx   . Kidney cancer Neg Hx   . Bladder Cancer Neg Hx       Review of Systems  Constitutional: Negative for activity change, chills, fatigue  and unexpected weight change.  HENT: Negative for congestion, postnasal drip, rhinorrhea, sneezing and sore throat.   Respiratory: Negative for cough, chest tightness, shortness of breath and wheezing.   Cardiovascular: Negative for chest pain and palpitations.       Blood pressure is well managed at this time.   Gastrointestinal: Negative for abdominal pain, constipation, diarrhea, nausea and vomiting.  Endocrine: Negative for cold intolerance, heat intolerance, polydipsia and polyuria.  Genitourinary: Positive for frequency.       The patient sees urology for problems with BPH  Musculoskeletal: Positive for arthralgias and myalgias.  Negative for back pain, joint swelling and neck pain.       Right shoulder pain . Re injured his right shoulder when moving into his fathers home.   Skin: Negative for rash.  Allergic/Immunologic: Negative for environmental allergies.  Neurological: Negative for dizziness, tremors, numbness and headaches.  Hematological: Negative for adenopathy. Does not bruise/bleed easily.  Psychiatric/Behavioral: Positive for decreased concentration. Negative for behavioral problems (Depression), sleep disturbance and suicidal ideas.       Patient sees psychiatry for severe ADHD.     Today's Vitals   04/15/20 1454  BP: (!) 142/90  Pulse: 84  Resp: 16  Temp: 97.9 F (36.6 C)  SpO2: 98%  Weight: 147 lb (66.7 kg)  Height: '5\' 8"'  (1.727 m)   Body mass index is 22.35 kg/m.  Physical Exam Vitals and nursing note reviewed.  Constitutional:      General: He is not in acute distress.    Appearance: Normal appearance. He is well-developed. He is not diaphoretic.  HENT:     Head: Normocephalic and atraumatic.     Nose: Nose normal.     Mouth/Throat:     Pharynx: No oropharyngeal exudate.  Eyes:     Pupils: Pupils are equal, round, and reactive to light.  Neck:     Thyroid: No thyromegaly.     Vascular: No JVD.     Trachea: No tracheal deviation.  Cardiovascular:     Rate and Rhythm: Normal rate and regular rhythm.     Pulses: Normal pulses.     Heart sounds: Normal heart sounds. No murmur heard.  No friction rub. No gallop.   Pulmonary:     Effort: Pulmonary effort is normal. No respiratory distress.     Breath sounds: Normal breath sounds. No wheezing or rales.  Chest:     Chest wall: No tenderness.  Abdominal:     General: Bowel sounds are normal.     Palpations: Abdomen is soft.     Tenderness: There is no abdominal tenderness.  Musculoskeletal:        General: Normal range of motion.     Cervical back: Normal range of motion and neck supple.     Comments: Right shoulder pain,  mostly posterior aspect. Tenderness more severe with movement. The ROM and strength of the right shoulder are mildly decreased due to pain. No palpable or abnormalities were noted today.   Lymphadenopathy:     Cervical: No cervical adenopathy.  Skin:    General: Skin is warm and dry.  Neurological:     General: No focal deficit present.     Mental Status: He is alert and oriented to person, place, and time.     Cranial Nerves: No cranial nerve deficit.  Psychiatric:        Mood and Affect: Mood normal.        Behavior: Behavior normal.  Thought Content: Thought content normal.        Judgment: Judgment normal.    LABS: Recent Results (from the past 2160 hour(s))  PSA     Status: None   Collection Time: 02/15/20  3:46 PM  Result Value Ref Range   Prostate Specific Ag, Serum 0.4 0.0 - 4.0 ng/mL    Comment: Roche ECLIA methodology. According to the American Urological Association, Serum PSA should decrease and remain at undetectable levels after radical prostatectomy. The AUA defines biochemical recurrence as an initial PSA value 0.2 ng/mL or greater followed by a subsequent confirmatory PSA value 0.2 ng/mL or greater. Values obtained with different assay methods or kits cannot be used interchangeably. Results cannot be interpreted as absolute evidence of the presence or absence of malignant disease.   Urinalysis, Complete     Status: Abnormal   Collection Time: 02/17/20  2:08 PM  Result Value Ref Range   Specific Gravity, UA >1.030 (H) 1.005 - 1.030   pH, UA 5.0 5.0 - 7.5   Color, UA Yellow Yellow   Appearance Ur Clear Clear   Leukocytes,UA Negative Negative   Protein,UA Negative Negative/Trace   Glucose, UA Negative Negative   Ketones, UA 1+ (A) Negative   RBC, UA Negative Negative   Bilirubin, UA Negative Negative   Urobilinogen, Ur 0.2 0.2 - 1.0 mg/dL   Nitrite, UA Negative Negative   Microscopic Examination See below:   Microscopic Examination     Status:  Abnormal   Collection Time: 02/17/20  2:08 PM   Urine  Result Value Ref Range   WBC, UA 0-5 0 - 5 /hpf   RBC 0-2 0 - 2 /hpf   Epithelial Cells (non renal) 0-10 0 - 10 /hpf   Casts Present (A) None seen /lpf   Cast Type Hyaline casts N/A   Bacteria, UA None seen None seen/Few  Comprehensive metabolic panel     Status: Abnormal   Collection Time: 04/11/20  2:36 PM  Result Value Ref Range   Glucose 106 (H) 65 - 99 mg/dL   BUN 10 8 - 27 mg/dL   Creatinine, Ser 0.84 0.76 - 1.27 mg/dL   GFR calc non Af Amer 93 >59 mL/min/1.73   GFR calc Af Amer 108 >59 mL/min/1.73    Comment: **[facility] currently reports eGFR in compliance with the current**   recommendations of the [employer]. [facility] will   update reporting as new guidelines are published from the NKF-ASN   Task force.    BUN/Creatinine Ratio 12 10 - 24   Sodium 140 134 - 144 mmol/L   Potassium 4.6 3.5 - 5.2 mmol/L   Chloride 103 96 - 106 mmol/L   CO2 26 20 - 29 mmol/L   Calcium 9.2 8.6 - 10.2 mg/dL   Total Protein 6.4 6.0 - 8.5 g/dL   Albumin 4.0 3.8 - 4.8 g/dL   Globulin, Total 2.4 1.5 - 4.5 g/dL   Albumin/Globulin Ratio 1.7 1.2 - 2.2   Bilirubin Total 0.5 0.0 - 1.2 mg/dL   Alkaline Phosphatase 63 48 - 121 IU/L   AST 18 0 - 40 IU/L   ALT 13 0 - 44 IU/L  CBC     Status: Abnormal   Collection Time: 04/11/20  2:36 PM  Result Value Ref Range   WBC 7.3 3.4 - 10.8 x10E3/uL   RBC 4.79 4.14 - 5.80 x10E6/uL   Hemoglobin 16.2 13.0 - 17.7 g/dL   Hematocrit 48.6 37.5 - 51.0 %  MCV 102 (H) 79 - 97 fL   MCH 33.8 (H) 26.6 - 33.0 pg   MCHC 33.3 31 - 35 g/dL   RDW 11.7 11.6 - 15.4 %   Platelets 242 150 - 450 x10E3/uL  Lipid Panel With LDL/HDL Ratio     Status: None   Collection Time: 04/11/20  2:36 PM  Result Value Ref Range   Cholesterol, Total 149 100 - 199 mg/dL   Triglycerides 42 0 - 149 mg/dL   HDL 70 >39 mg/dL   VLDL Cholesterol Cal 10 5 - 40 mg/dL   LDL Chol Calc (NIH) 69 0 - 99 mg/dL   LDL/HDL Ratio 1.0  0.0 - 3.6 ratio    Comment:                                     LDL/HDL Ratio                                             Men  Women                               1/2 Avg.Risk  1.0    1.5                                   Avg.Risk  3.6    3.2                                2X Avg.Risk  6.2    5.0                                3X Avg.Risk  8.0    6.1   HCV Ab w Reflex to Quant PCR     Status: None   Collection Time: 04/11/20  2:36 PM  Result Value Ref Range   HCV Ab <0.1 0.0 - 0.9 s/co ratio  Interpretation:     Status: None   Collection Time: 04/11/20  2:36 PM  Result Value Ref Range   HCV Interp 1: Comment     Comment: Negative Not infected with HCV, unless recent infection is suspected or other evidence exists to indicate HCV infection.   T4, free     Status: None   Collection Time: 04/11/20  2:36 PM  Result Value Ref Range   Free T4 1.24 0.82 - 1.77 ng/dL  TSH     Status: None   Collection Time: 04/11/20  2:36 PM  Result Value Ref Range   TSH 1.380 0.450 - 4.500 uIU/mL  UA/M w/rflx Culture, Routine     Status: Abnormal   Collection Time: 04/15/20  3:49 PM   Specimen: Urine   Urine  Result Value Ref Range   Specific Gravity, UA 1.023 1.005 - 1.030   pH, UA 6.5 5.0 - 7.5   Color, UA Yellow Yellow   Appearance Ur Clear Clear   Leukocytes,UA Trace (A) Negative   Protein,UA Negative Negative/Trace   Glucose, UA Negative Negative   Ketones, UA Trace (A) Negative  RBC, UA Negative Negative   Bilirubin, UA Negative Negative   Urobilinogen, Ur 1.0 0.2 - 1.0 mg/dL   Nitrite, UA Negative Negative   Microscopic Examination See below:     Comment: Microscopic was indicated and was performed.   Urinalysis Reflex Comment     Comment: This specimen has reflexed to a Urine Culture.  Microscopic Examination     Status: None   Collection Time: 04/15/20  3:49 PM   Urine  Result Value Ref Range   WBC, UA 0-5 0 - 5 /hpf   RBC None seen 0 - 2 /hpf   Epithelial Cells (non renal)  None seen 0 - 10 /hpf   Casts None seen None seen /lpf   Bacteria, UA None seen None seen/Few  Urine Culture, Reflex     Status: None   Collection Time: 04/15/20  3:49 PM   Urine  Result Value Ref Range   Urine Culture, Routine Final report    Organism ID, Bacteria No growth     Assessment/Plan: 1. Encounter for general adult medical examination with abnormal findings Annual health maintenance exam today.   2. Essential hypertension Stable. Continue bp medication as prescribed   3. Mixed hyperlipidemia Lipid panel stable. Continue simvastatin as prescribed   4. Atherosclerosis of aorta [facility]) Patient sees cardiology on routine visit. Will monitor.   5. Acute pain of right shoulder May take meloxicam 7.23m up to twice daily as needed for pain/inflammation. Will repeat x-ray and get referral to orthopedics as indicated.  - meloxicam (MOBIC) 7.5 MG tablet; Take 1 tablet (7.5 mg total) by mouth 2 (two) times daily as needed for pain.  Dispense: 60 tablet; Refill: 2  6. Dysuria - UA/M w/rflx Culture, Routine  General Counseling: CTonnieverbalizes understanding of the findings of todays visit and agrees with plan of treatment. I have discussed any further diagnostic evaluation that may be needed or ordered today. We also reviewed his medications today. he has been encouraged to call the office with any questions or concerns that should arise related to todays visit.    Counseling:  This patient was seen by HLeretha PolFNP Collaboration with Dr FLavera Guiseas a part of collaborative care agreement  Orders Placed This Encounter  Procedures  . Microscopic Examination  . Urine Culture, Reflex  . UA/M w/rflx Culture, Routine    Meds ordered this encounter  Medications  . meloxicam (MOBIC) 7.5 MG tablet    Sig: Take 1 tablet (7.5 mg total) by mouth 2 (two) times daily as needed for pain.    Dispense:  60 tablet    Refill:  2    Order Specific Question:   Supervising  Provider    Answer:   KLavera Guise[[[id number]   Total time spent: 46Minutes  Time spent includes review of chart, medications, test results, and follow up plan with the patient.     FLavera Guise MD  Internal Medicine

## 2020-04-17 LAB — MICROSCOPIC EXAMINATION
Bacteria, UA: NONE SEEN
Casts: NONE SEEN /lpf
Epithelial Cells (non renal): NONE SEEN /hpf (ref 0–10)
RBC, Urine: NONE SEEN /hpf (ref 0–2)

## 2020-04-17 LAB — URINE CULTURE, REFLEX: Organism ID, Bacteria: NO GROWTH

## 2020-04-17 LAB — UA/M W/RFLX CULTURE, ROUTINE
Bilirubin, UA: NEGATIVE
Glucose, UA: NEGATIVE
Nitrite, UA: NEGATIVE
Protein,UA: NEGATIVE
RBC, UA: NEGATIVE
Specific Gravity, UA: 1.023 (ref 1.005–1.030)
Urobilinogen, Ur: 1 mg/dL (ref 0.2–1.0)
pH, UA: 6.5 (ref 5.0–7.5)

## 2020-04-27 ENCOUNTER — Other Ambulatory Visit: Payer: Self-pay

## 2020-04-27 DIAGNOSIS — E782 Mixed hyperlipidemia: Secondary | ICD-10-CM

## 2020-04-27 MED ORDER — SIMVASTATIN 40 MG PO TABS: 40.0000 mg | ORAL_TABLET | Freq: Every day | ORAL | 3 refills | Status: DC

## 2020-04-28 ENCOUNTER — Other Ambulatory Visit: Payer: Self-pay

## 2020-04-28 DIAGNOSIS — I10 Essential (primary) hypertension: Secondary | ICD-10-CM

## 2020-04-28 MED ORDER — LISINOPRIL 10 MG PO TABS: 10.0000 mg | ORAL_TABLET | Freq: Every day | ORAL | 3 refills | Status: DC

## 2020-05-18 ENCOUNTER — Encounter: Payer: Self-pay | Admitting: Dermatology

## 2020-05-18 ENCOUNTER — Other Ambulatory Visit: Payer: Self-pay

## 2020-05-18 ENCOUNTER — Ambulatory Visit
Admission: RE | Admit: 2020-05-18 | Discharge: 2020-05-18 | Disposition: A | Discharge status: Home / Self Care | Payer: Medicaid Other | Source: Ambulatory Visit | Attending: Nurse Practitioner | Admitting: Nurse Practitioner

## 2020-05-18 ENCOUNTER — Ambulatory Visit (INDEPENDENT_AMBULATORY_CARE_PROVIDER_SITE_OTHER): Payer: Medicaid Other | Admitting: Dermatology

## 2020-05-18 DIAGNOSIS — Z1283 Encounter for screening for malignant neoplasm of skin: Secondary | ICD-10-CM | POA: Diagnosis not present

## 2020-05-18 DIAGNOSIS — Z122 Encounter for screening for malignant neoplasm of respiratory organs: Secondary | ICD-10-CM | POA: Insufficient documentation

## 2020-05-18 DIAGNOSIS — I781 Nevus, non-neoplastic: Secondary | ICD-10-CM

## 2020-05-18 DIAGNOSIS — D485 Neoplasm of uncertain behavior of skin: Secondary | ICD-10-CM | POA: Diagnosis not present

## 2020-05-18 DIAGNOSIS — K4090 Unilateral inguinal hernia, without obstruction or gangrene, not specified as recurrent: Secondary | ICD-10-CM | POA: Diagnosis not present

## 2020-05-18 DIAGNOSIS — Z86018 Personal history of other benign neoplasm: Secondary | ICD-10-CM

## 2020-05-18 DIAGNOSIS — L821 Other seborrheic keratosis: Secondary | ICD-10-CM

## 2020-05-18 DIAGNOSIS — Z87891 Personal history of nicotine dependence: Secondary | ICD-10-CM | POA: Diagnosis present

## 2020-05-18 DIAGNOSIS — D1800 Hemangioma unspecified site: Secondary | ICD-10-CM

## 2020-05-18 DIAGNOSIS — I8390 Asymptomatic varicose veins of unspecified lower extremity: Secondary | ICD-10-CM

## 2020-05-18 DIAGNOSIS — D229 Melanocytic nevi, unspecified: Secondary | ICD-10-CM

## 2020-05-18 DIAGNOSIS — D492 Neoplasm of unspecified behavior of bone, soft tissue, and skin: Secondary | ICD-10-CM

## 2020-05-18 DIAGNOSIS — L814 Other melanin hyperpigmentation: Secondary | ICD-10-CM

## 2020-05-18 DIAGNOSIS — L578 Other skin changes due to chronic exposure to nonionizing radiation: Secondary | ICD-10-CM

## 2020-05-18 HISTORY — DX: Personal history of other benign neoplasm: Z86.018

## 2020-05-18 NOTE — Patient Instructions (Signed)

## 2020-05-18 NOTE — Progress Notes (Signed)
Follow-Up Visit   Subjective  Alex Bowers is a 64 y.o. male who presents for the following: Annual Exam (Total body skin exam, hx of dysplastic nevus). The patient presents for Total-Body Skin Exam (TBSE) for skin cancer screening and mole check.  The following portions of the chart were reviewed this encounter and updated as appropriate:  Tobacco  Allergies  Meds  Problems  Med Hx  Surg Hx  Fam Hx     Review of Systems:  No other skin or systemic complaints except as noted in HPI or Assessment and Plan.  Objective  Well appearing patient in no apparent distress; mood and affect are within normal limits.  A full examination was performed including scalp, head, eyes, ears, nose, lips, neck, chest, axillae, abdomen, back, buttocks, bilateral upper extremities, bilateral lower extremities, hands, feet, fingers, toes, fingernails, and toenails. All findings within normal limits unless otherwise noted below.  Objective  face: Dilated vessels face  Objective  Left anterior deltoid: 0.6cm irregular brown macule  Objective  L inguinal: hernia   Assessment & Plan  Telangiectasia face Benign, observe Discussed BBL treatment  Neoplasm of skin Left anterior deltoid  Epidermal / dermal shaving  Lesion diameter (cm):  0.6 Informed consent: discussed and consent obtained   Timeout: patient name, date of birth, surgical site, and procedure verified   Procedure prep:  Patient was prepped and draped in usual sterile fashion Prep type:  Isopropyl alcohol Anesthesia: the lesion was anesthetized in a standard fashion   Anesthetic:  1% lidocaine w/ epinephrine 1-100,000 buffered w/ 8.4% NaHCO3 Instrument used: flexible razor blade   Hemostasis achieved with: pressure, aluminum chloride and electrodesiccation   Outcome: patient tolerated procedure well   Post-procedure details: sterile dressing applied and wound care instructions given   Dressing type: bandage and  petrolatum    Specimen 1 - Surgical pathology Differential Diagnosis: D48.5 Nevus vs Dysplastic Nevus Check Margins: yes 0.6cm irregular brown macule  Nevus vs Dysplastic Nevus, shave removal today  Unilateral inguinal hernia on Left L inguinal Recommend pt continue to have followed by urologist for checks   Lentigines - Scattered tan macules - Discussed due to sun exposure - Benign, observe - Call for any changes  Seborrheic Keratoses - Stuck-on, waxy, tan-brown papules and plaques  - Discussed benign etiology and prognosis. - Observe - Call for any changes  Melanocytic Nevi - Tan-brown and/or pink-flesh-colored symmetric macules and papules - Benign appearing on exam today - Observation - Call clinic for new or changing moles - Recommend daily use of broad spectrum spf 30+ sunscreen to sun-exposed areas.   Hemangiomas - Red papules - Discussed benign nature - Observe - Call for any changes  Actinic Damage - diffuse scaly erythematous macules with underlying dyspigmentation - Recommend daily broad spectrum sunscreen SPF 30+ to sun-exposed areas, reapply every 2 hours as needed.  - Call for new or changing lesions.  Skin cancer screening performed today.  History of Dysplastic Nevi - No evidence of recurrence today - Recommend regular full body skin exams - Recommend daily broad spectrum sunscreen SPF 30+ to sun-exposed areas, reapply every 2 hours as needed.  - Call if any new or changing lesions are noted between office visits - R proximal lateral deltoid, R distal posterior deltoid  Varicose Veins - Dilated blue, purple or red veins at the lower extremities - Reassured - These can be treated by sclerotherapy (a procedure to inject a medicine into the veins to make them disappear)  if desired, but the treatment is not covered by insurance   Return in about 1 year (around 05/18/2021) for TBSE, hx of dysplastic nevi.  I, Othelia Pulling, RMA, am acting as scribe  for Sarina Ser, MD .  Documentation: I have reviewed the above documentation for accuracy and completeness, and I agree with the above.  Sarina Ser, MD

## 2020-05-20 ENCOUNTER — Encounter: Payer: Self-pay | Admitting: *Deleted

## 2020-05-24 ENCOUNTER — Telehealth: Payer: Self-pay

## 2020-05-24 NOTE — Telephone Encounter (Signed)
Left message for patient to call office for results/hd 

## 2020-05-24 NOTE — Telephone Encounter (Signed)
-----   Message from Ralene Bathe, MD sent at 05/23/2020  5:51 PM EDT ----- Skin , left anterior deltoid DYSPLASTIC COMPOUND NEVUS WITH SEVERE ATYPIA, DEEP MARGIN INVOLVED,  Severe dysplastic Schedule surgery

## 2020-05-24 NOTE — Telephone Encounter (Signed)
Pt informed of results. He had no concerns. 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 Only option was to schedule surgery on a Monday, 07/25/20, with Dr. Nicole Kindred. Pt is aware surgery would be with Dr. Nicole Kindred and is okay with the change.

## 2020-06-23 ENCOUNTER — Other Ambulatory Visit: Payer: Self-pay

## 2020-06-23 DIAGNOSIS — E782 Mixed hyperlipidemia: Secondary | ICD-10-CM

## 2020-06-23 DIAGNOSIS — I10 Essential (primary) hypertension: Secondary | ICD-10-CM

## 2020-06-23 MED ORDER — SIMVASTATIN 40 MG PO TABS: 40.0000 mg | ORAL_TABLET | Freq: Every day | ORAL | 3 refills | Status: DC

## 2020-06-23 MED ORDER — LISINOPRIL 10 MG PO TABS: 10.0000 mg | ORAL_TABLET | Freq: Every day | ORAL | 3 refills | Status: DC

## 2020-07-25 ENCOUNTER — Ambulatory Visit: Payer: Medicaid Other | Admitting: Dermatology

## 2020-07-25 ENCOUNTER — Other Ambulatory Visit: Payer: Self-pay

## 2020-07-25 ENCOUNTER — Encounter: Payer: Self-pay | Admitting: Dermatology

## 2020-07-25 DIAGNOSIS — D2239 Melanocytic nevi of other parts of face: Secondary | ICD-10-CM | POA: Diagnosis not present

## 2020-07-25 DIAGNOSIS — L578 Other skin changes due to chronic exposure to nonionizing radiation: Secondary | ICD-10-CM

## 2020-07-25 DIAGNOSIS — D485 Neoplasm of uncertain behavior of skin: Secondary | ICD-10-CM

## 2020-07-25 DIAGNOSIS — L821 Other seborrheic keratosis: Secondary | ICD-10-CM | POA: Diagnosis not present

## 2020-07-25 DIAGNOSIS — D229 Melanocytic nevi, unspecified: Secondary | ICD-10-CM

## 2020-07-25 DIAGNOSIS — Z86018 Personal history of other benign neoplasm: Secondary | ICD-10-CM

## 2020-07-25 NOTE — Patient Instructions (Addendum)

## 2020-07-25 NOTE — Progress Notes (Signed)
Follow-Up Visit   Subjective  Alex Bowers is a 64 y.o. male who presents for the following: Severe Dysplastic Nevus (left anterior deltoid, patient here for surgery). He has an itchy spot on his back he would like checked. He has an irritated mole on his left cheek he would like removed.  The following portions of the chart were reviewed this encounter and updated as appropriate:      Review of Systems:  No other skin or systemic complaints except as noted in HPI or Assessment and Plan.  Objective  Well appearing patient in no apparent distress; mood and affect are within normal limits.  A focused examination was performed including face, shoulder. Relevant physical exam findings are noted in the Assessment and Plan.  Objective  Left Medial scapula: 3.79mm pink flesh papule  Objective  Left Lateral Cheek: 4.43mm pink flesh papule  Objective  Left Anterior Deltoid: Not able to identify exact location today. Appears to be completely removed by biopsy.  Images       Assessment & Plan   Actinic Damage - chronic, secondary to cumulative UV radiation exposure/sun exposure over time - diffuse scaly erythematous macules with underlying dyspigmentation - Recommend daily broad spectrum sunscreen SPF 30+ to sun-exposed areas, reapply every 2 hours as needed.  - Call for new or changing lesions. Seborrheic Keratoses - Stuck-on, waxy, tan-brown papules and plaques  - Discussed benign etiology and prognosis. - Observe - Call for any changes  Melanocytic Nevi - Tan-brown and/or pink-flesh-colored symmetric macules and papules - Benign appearing on exam today - Observation - Call clinic for new or changing moles - Recommend daily use of broad spectrum spf 30+ sunscreen to sun-exposed areas.    Neoplasm of uncertain behavior of skin (2) Left Medial scapula  Skin / nail biopsy Type of biopsy: tangential   Informed consent: discussed and consent obtained     Patient was prepped and draped in usual sterile fashion: Area prepped with alcohol. Anesthesia: the lesion was anesthetized in a standard fashion   Anesthetic:  1% lidocaine w/ epinephrine 1-100,000 buffered w/ 8.4% NaHCO3 Instrument used: flexible razor blade   Hemostasis achieved with: pressure, aluminum chloride and electrodesiccation   Outcome: patient tolerated procedure well   Post-procedure details: wound care instructions given   Post-procedure details comment:  Ointment and small bandage applied  Specimen 2 - Surgical pathology Differential Diagnosis: Irritated Nevus vs other Check Margins: No 3.3mm pink flesh papule  Left Lateral Cheek  Skin / nail biopsy Type of biopsy: tangential   Informed consent: discussed and consent obtained   Patient was prepped and draped in usual sterile fashion: Area prepped with alcohol. Anesthesia: the lesion was anesthetized in a standard fashion   Anesthetic:  1% lidocaine w/ epinephrine 1-100,000 buffered w/ 8.4% NaHCO3 Instrument used: flexible razor blade   Hemostasis achieved with: pressure, aluminum chloride and electrodesiccation   Outcome: patient tolerated procedure well   Post-procedure details: wound care instructions given   Post-procedure details comment:  Ointment and small bandage applied  Specimen 3 - Surgical pathology Differential Diagnosis: Irritated Nevus vs other Check Margins: No 4.32mm pink flesh papule  History of dysplastic nevus Left Anterior Deltoid  Clear. Observe for recurrence. Call clinic for new or changing lesions.  Recommend regular skin exams, daily broad-spectrum spf 30+ sunscreen use, and photoprotection.    Dr Raliegh Ip will recheck on follow-up in May 2022.  Return in about 5 months (around 12/23/2020) for for recheck of severe DN of  the left ant deltoid with Dr Dara Lords, Jamesetta Orleans, CMA, am acting as scribe for Brendolyn Patty, MD .  Documentation: I have reviewed the above documentation for accuracy and  completeness, and I agree with the above.  Brendolyn Patty MD

## 2020-07-27 ENCOUNTER — Other Ambulatory Visit: Payer: Self-pay

## 2020-07-27 DIAGNOSIS — M25511 Pain in right shoulder: Secondary | ICD-10-CM

## 2020-07-27 MED ORDER — MELOXICAM 7.5 MG PO TABS: 7.5000 mg | ORAL_TABLET | Freq: Two times a day (BID) | ORAL | 2 refills | Status: DC | PRN

## 2020-08-01 ENCOUNTER — Telehealth: Payer: Self-pay

## 2020-08-01 NOTE — Telephone Encounter (Signed)
Left message for patient advising him both biopsies done were benign. Patient to call with any further questions.

## 2020-08-01 NOTE — Telephone Encounter (Signed)
-----   Message from Brendolyn Patty, MD sent at 08/01/2020  9:03 AM EST ----- 1. Skin , left medial scapula SEBORRHEIC KERATOSIS, IRRITATED 2. Skin , left lateral cheek MELANOCYTIC NEVUS, COMPOUND TYPE  1. Benign ISK 2. Benign mole

## 2020-09-27 ENCOUNTER — Telehealth: Payer: Self-pay

## 2020-09-27 NOTE — Telephone Encounter (Signed)
[name] to reschedule Friday afternoon appt 04-21-21

## 2020-10-17 ENCOUNTER — Other Ambulatory Visit: Payer: Self-pay

## 2020-10-17 ENCOUNTER — Ambulatory Visit (INDEPENDENT_AMBULATORY_CARE_PROVIDER_SITE_OTHER): Payer: Medicaid Other | Admitting: Internal Medicine

## 2020-10-17 ENCOUNTER — Encounter: Payer: Self-pay | Admitting: Physician Assistant

## 2020-10-17 VITALS — BP 138/89 | HR 65 | Temp 97.6°F | Resp 16 | Ht 68.0 in | Wt 158.4 lb

## 2020-10-17 DIAGNOSIS — R0989 Other specified symptoms and signs involving the circulatory and respiratory systems: Secondary | ICD-10-CM | POA: Diagnosis not present

## 2020-10-17 DIAGNOSIS — I700 Atherosclerosis of aorta: Secondary | ICD-10-CM

## 2020-10-17 DIAGNOSIS — I10 Essential (primary) hypertension: Secondary | ICD-10-CM

## 2020-10-17 DIAGNOSIS — E782 Mixed hyperlipidemia: Secondary | ICD-10-CM | POA: Diagnosis not present

## 2020-10-17 DIAGNOSIS — G8929 Other chronic pain: Secondary | ICD-10-CM

## 2020-10-17 DIAGNOSIS — M25511 Pain in right shoulder: Secondary | ICD-10-CM

## 2020-10-17 DIAGNOSIS — F17210 Nicotine dependence, cigarettes, uncomplicated: Secondary | ICD-10-CM

## 2020-10-17 MED ORDER — ROSUVASTATIN CALCIUM 10 MG PO TABS: 10.0000 mg | ORAL_TABLET | Freq: Every day | ORAL | 1 refills | Status: DC

## 2020-10-17 NOTE — Progress Notes (Unsigned)
[facility] [place], [place] [address]  Internal MEDICINE  Office Visit Note  Patient Name: Alex Bowers  [id number]  [id number]  Date of Service: 10/18/2020  Chief Complaint  Patient presents with  . 6 month follow up  . Hyperlipidemia  . Hypertension  . Shoulder Pain    Right shoulder  . Quality Metric Gaps    Covid, flu    HPI   Pt is here for routine follow up. He feels well otherwise, does see psych for other medical problems. Pt has h/o CVA, carotid and aortic atherosclerosis, on Zocor. Blood pressure is well controlled.  C/O shoulder pain followed by ortho   2015 Colonoscopy 2021 CT chest lung cancer screening  1. Lung-RADS 2, benign appearance or behavior. Continue annual screening with low-dose chest CT without contrast in 12 months. 2. Three-vessel coronary atherosclerosis. 3. Aortic Atherosclerosis (ICD10-I70.0) and Emphysema (ICD10-  2014 CT head ( pt presented with slurred speech) Acute small nonhemorrhagic infarct posterior aspect of the left  lenticular nucleus extending into the posterior aspect of the left  corona radiata.    Current Medication: Outpatient Encounter Medications as of 10/17/2020  Medication Sig  . ADDERALL XR 15 MG 24 hr capsule TAKE 1 TABLET IN AFTERNOON  . amphetamine-dextroamphetamine (ADDERALL) 5 MG tablet Take 5 mg by mouth daily.  Marland Kitchen aspirin EC 81 MG tablet Take 81 mg by mouth daily.  Marland Kitchen buPROPion (WELLBUTRIN SR) 150 MG 12 hr tablet Take 150 mg by mouth daily.  . citalopram (CELEXA) 40 MG tablet Take 20 mg by mouth daily.   . finasteride (PROSCAR) 5 MG tablet Take 1 tablet (5 mg total) by mouth daily.  . Fish Oil-Cholecalciferol (FISH OIL + D3 PO) Take by mouth.  Marland Kitchen lisinopril (ZESTRIL) 10 MG tablet Take 1 tablet (10 mg total) by mouth daily.  . meloxicam (MOBIC) 7.5 MG tablet Take 1 tablet (7.5 mg total) by mouth 2 (two) times daily as needed for pain.  . Multiple Vitamin (MULTI VITAMIN MENS PO)  Take by mouth.  . rosuvastatin (CRESTOR) 10 MG tablet Take 1 tablet (10 mg total) by mouth daily. For high cholesterol  . tadalafil (CIALIS) 5 MG tablet Take 1 tablet (5 mg total) by mouth daily as needed for erectile dysfunction.  . triamcinolone (KENALOG) 0.025 % cream Apply 1 application topically 2 (two) times daily.  . [DISCONTINUED] sildenafil (REVATIO) 20 MG tablet Take 3 to 5 tablets two hours before intercouse on an empty stomach.  Do not take with nitrates.  . [DISCONTINUED] simvastatin (ZOCOR) 40 MG tablet Take 1 tablet (40 mg total) by mouth daily at 6 PM.  . [DISCONTINUED] Ascorbic Acid (VITAMIN C) 1000 MG tablet Take 1,000 mg by mouth 2 (two) times daily. (Patient not taking: Reported on 04/15/2020)   No facility-administered encounter medications on file as of 10/17/2020.    Surgical History: Past Surgical History:  Procedure Laterality Date  . COLONOSCOPY  2015  . HERNIA REPAIR Right 06/09/14   inguinal   . teeth removal       Medical History: Past Medical History:  Diagnosis Date  . Dysplastic nevus 07/08/2014   Right proximal lateral deltoid. Sever atypia, lateral margin involved. Excised 09/07/2014, margins free.  Marland Kitchen Dysplastic nevus 11/16/2014   Right distal post. deltoid. Melanocytic hyperplasia and incidental dysplastic nevus with mild atypia, margins free. Excised.  . Hyperlipidemia   . Hypertension   . Personal history of tobacco use, presenting hazards to health 12/29/2015  .  Stroke [facility]) 2014    Family History: Family History  Problem Relation Age of Onset  . Cancer - Other Brother        phageal  . Heart attack Father   . Colon polyps Mother   . Prostate cancer Neg Hx   . Kidney cancer Neg Hx   . Bladder Cancer Neg Hx     Social History   Socioeconomic History  . Marital status: Divorced    Spouse name: Not on file  . Number of children: Not on file  . Years of education: Not on file  . Highest education level: Not on file  Occupational  History  . Not on file  Tobacco Use  . Smoking status: Current Every Day Smoker    Packs/day: 2.00    Years: 37.00    Pack years: 74.00    Types: Cigarettes  . Smokeless tobacco: Never Used  Substance and Sexual Activity  . Alcohol use: Yes    Comment: occasionally  . Drug use: No  . Sexual activity: Not on file  Other Topics Concern  . Not on file  Social History Narrative  . Not on file   Social Determinants of Health   Financial Resource Strain: Not on file  Food Insecurity: Not on file  Transportation Needs: Not on file  Physical Activity: Not on file  Stress: Not on file  Social Connections: Not on file  Intimate Partner Violence: Not on file      Review of Systems  Constitutional: Negative for chills, fatigue and unexpected weight change.  HENT: Positive for postnasal drip. Negative for congestion, rhinorrhea, sneezing and sore throat.   Eyes: Negative for redness.  Respiratory: Negative for cough, chest tightness and shortness of breath.   Cardiovascular: Negative for chest pain and palpitations.  Gastrointestinal: Negative for abdominal pain, constipation, diarrhea, nausea and vomiting.  Genitourinary: Negative for dysuria and frequency.  Musculoskeletal: Positive for arthralgias. Negative for back pain, joint swelling and neck pain.  Skin: Negative for rash.  Neurological: Negative.  Negative for tremors and numbness.  Hematological: Negative for adenopathy. Does not bruise/bleed easily.  Psychiatric/Behavioral: Negative for behavioral problems (Depression), sleep disturbance and suicidal ideas. The patient is not nervous/anxious.     Vital Signs: BP 138/89   Pulse 65   Temp 97.6 F (36.4 C)   Resp 16   Ht 5\' 8"  (1.727 m)   Wt 158 lb 6.4 oz (71.8 kg)   SpO2 99%   BMI 24.08 kg/m    Physical Exam Constitutional:      General: He is not in acute distress.    Appearance: He is well-developed. He is not diaphoretic.  HENT:     Head: Normocephalic  and atraumatic.     Mouth/Throat:     Pharynx: No oropharyngeal exudate.  Eyes:     Pupils: Pupils are equal, round, and reactive to light.  Neck:     Thyroid: No thyromegaly.     Vascular: No JVD.     Trachea: No tracheal deviation.  Cardiovascular:     Rate and Rhythm: Normal rate and regular rhythm.     Heart sounds: Normal heart sounds. No murmur heard. No friction rub. No gallop.   Pulmonary:     Effort: Pulmonary effort is normal. No respiratory distress.     Breath sounds: No wheezing or rales.  Chest:     Chest wall: No tenderness.  Abdominal:     General: Bowel sounds are normal.  Palpations: Abdomen is soft.  Musculoskeletal:        General: Normal range of motion.     Cervical back: Normal range of motion and neck supple.  Lymphadenopathy:     Cervical: No cervical adenopathy.  Skin:    General: Skin is warm and dry.  Neurological:     Mental Status: He is alert and oriented to person, place, and time.     Cranial Nerves: No cranial nerve deficit.  Psychiatric:        Behavior: Behavior normal.        Thought Content: Thought content normal.        Judgment: Judgment normal.        Assessment/Plan: 1. Essential hypertension Controlled, will continue on Zestril   2. Right carotid bruit Pt does have a bruit, will need follow up study  - US Carotid Bilateral; Future   Cardiac risk factor modification:  1. Control blood pressure. 2. Exercise as prescribed. 3. Follow low sodium, low fat diet. and low fat and low cholestrol diet. 4. Take ASA 81mg  once a day. 5. Restricted calories diet to lose weight.  3. Mixed hyperlipidemia Pt has been on simvastatin however will switch to Crestor for better CV protection   4. Atherosclerosis of aorta (HCC) Start Crestor as prescribed today   5. Cigarette nicotine dependence without complication Smoking cessation counseling: 1. Pt acknowledges the risks of long term smoking, she will try to quite  smoking. 2. Options for different medications including nicotine products, chewing gum, patch etc, Wellbutrin and Chantix is discussed 3. Goal and date of compete cessation is discussed 4. Total time spent in smoking cessation is 10 min.   Will need CT scheduled at next appointment  What is lung cancer screening?   Lung cancer screening is a way in which doctors check the lungs for early signs of cancer in people who have no symptoms of lung cancer. Doctors suggest this screening for certain people who are at high risk of lung cancer because they smoke, or used to smoke. Although screening is not likely to be helpful for all smokers, doctors do think it might help prevent cancer deaths in people who smoke a lot or smoked for many years (even if they have already quit). Researchers have studied chest X-rays and "low-dose CT scans," two types of imaging tests, to see if they are good screening tools. Imaging tests create pictures of the inside of the body. A low-dose CT scan uses much less radiation than a typical CT scan and shows a more detailed image of the lungs than a standard X-ray. It turns out that chest X-rays do not work for screening for lung cancer. Low-dose CT scans, on the other hand, are helpful screening tools for some people at high risk of lung cancer. The goal of lung cancer screening is to find cancer early, before it has a chance to grow, spread, or cause problems. Several large studies found that smokers who were screened with low-dose CT scans were less likely to die of lung cancer than those who were screened with a standard X-ray.  The best way to lower your chances of getting or dying from lung cancer is to quit smoking. No matter how much or how long you have smoked, quitting is a good idea. Quitting now will reduce your chances not only of lung problems, but also of heart disease and many forms of cancer.   6. Chronic right shoulder pain Per ortho  General  Counseling:  lorne winkels understanding of the findings of todays visit and agrees with plan of treatment. I have discussed any further diagnostic evaluation that may be needed or ordered today. We also reviewed his medications today. he has been encouraged to call the office with any questions or concerns that should arise related to todays visit.    Orders Placed This Encounter  Procedures  . US Carotid Bilateral    Meds ordered this encounter  Medications  . rosuvastatin (CRESTOR) 10 MG tablet    Sig: Take 1 tablet (10 mg total) by mouth daily. For high cholesterol    Dispense:  90 tablet    Refill:  1    Total time spent: 35Minutes Time spent includes review of chart, medications, test results, and follow up plan with the patient.   [place] Controlled Substance Database was reviewed by me.   Dr [name] Internal medicine

## 2020-10-31 ENCOUNTER — Telehealth: Payer: Self-pay

## 2020-10-31 NOTE — Telephone Encounter (Signed)
[name] to confirm for 11-02-20 office visit JS

## 2020-11-01 ENCOUNTER — Telehealth: Payer: Self-pay

## 2020-11-01 NOTE — Telephone Encounter (Signed)
[name] to reschedule 11-01-20 ultrasound.Alex Bowers

## 2020-11-02 ENCOUNTER — Other Ambulatory Visit: Payer: Medicaid Other

## 2020-11-25 ENCOUNTER — Telehealth: Payer: Self-pay

## 2020-11-25 NOTE — Telephone Encounter (Signed)
[name] for patient advising of ultrasound scheduled for 11/30/20 at 3:00 at the med center in [facility]. Alex Bowers

## 2020-11-30 ENCOUNTER — Other Ambulatory Visit: Payer: Self-pay

## 2020-11-30 ENCOUNTER — Ambulatory Visit (HOSPITAL_BASED_OUTPATIENT_CLINIC_OR_DEPARTMENT_OTHER)
Admission: RE | Admit: 2020-11-30 | Discharge: 2020-11-30 | Disposition: A | Discharge status: Home / Self Care | Payer: Medicaid Other | Source: Ambulatory Visit | Attending: Internal Medicine | Admitting: Internal Medicine

## 2020-11-30 ENCOUNTER — Other Ambulatory Visit: Payer: Medicaid Other

## 2020-11-30 DIAGNOSIS — R0989 Other specified symptoms and signs involving the circulatory and respiratory systems: Secondary | ICD-10-CM | POA: Diagnosis not present

## 2020-12-01 NOTE — Progress Notes (Signed)
Check app

## 2020-12-06 ENCOUNTER — Other Ambulatory Visit: Payer: Self-pay | Admitting: *Deleted

## 2020-12-06 DIAGNOSIS — N138 Other obstructive and reflux uropathy: Secondary | ICD-10-CM

## 2020-12-21 ENCOUNTER — Encounter: Payer: Self-pay | Admitting: Dermatology

## 2020-12-21 ENCOUNTER — Other Ambulatory Visit: Payer: Self-pay

## 2020-12-21 ENCOUNTER — Ambulatory Visit (INDEPENDENT_AMBULATORY_CARE_PROVIDER_SITE_OTHER): Payer: Medicaid Other | Admitting: Dermatology

## 2020-12-21 DIAGNOSIS — Z1283 Encounter for screening for malignant neoplasm of skin: Secondary | ICD-10-CM

## 2020-12-21 DIAGNOSIS — L578 Other skin changes due to chronic exposure to nonionizing radiation: Secondary | ICD-10-CM

## 2020-12-21 DIAGNOSIS — D229 Melanocytic nevi, unspecified: Secondary | ICD-10-CM

## 2020-12-21 DIAGNOSIS — D1800 Hemangioma unspecified site: Secondary | ICD-10-CM

## 2020-12-21 DIAGNOSIS — Z86018 Personal history of other benign neoplasm: Secondary | ICD-10-CM | POA: Diagnosis not present

## 2020-12-21 DIAGNOSIS — L821 Other seborrheic keratosis: Secondary | ICD-10-CM

## 2020-12-21 DIAGNOSIS — L814 Other melanin hyperpigmentation: Secondary | ICD-10-CM

## 2020-12-21 NOTE — Patient Instructions (Signed)
Melanoma ABCDEs ° °Melanoma is the most dangerous type of skin cancer, and is the leading cause of death from skin disease.  You are more likely to develop melanoma if you: °Have light-colored skin, light-colored eyes, or red or blond hair °Spend a lot of time in the sun °Tan regularly, either outdoors or in a tanning bed °Have had blistering sunburns, especially during childhood °Have a close family member who has had a melanoma °Have atypical moles or large birthmarks ° °Early detection of melanoma is key since treatment is typically straightforward and cure rates are extremely high if we catch it early.  ° °The first sign of melanoma is often a change in a mole or a new dark spot.  The ABCDE system is a way of remembering the signs of melanoma. ° °A for asymmetry:  The two halves do not match. °B for border:  The edges of the growth are irregular. °C for color:  A mixture of colors are present instead of an even brown color. °D for diameter:  Melanomas are usually (but not always) greater than 6mm - the size of a pencil eraser. °E for evolution:  The spot keeps changing in size, shape, and color. ° °Please check your skin once per month between visits. You can use a small mirror in front and a large mirror behind you to keep an eye on the back side or your body.  ° °If you see any new or changing lesions before your next follow-up, please call to schedule a visit. ° °Please continue daily skin protection including broad spectrum sunscreen SPF 30+ to sun-exposed areas, reapplying every 2 hours as needed when you're outdoors.  ° °Staying in the shade or wearing long sleeves, sun glasses (UVA+UVB protection) and wide brim hats (4-inch brim around the entire circumference of the hat) are also recommended for sun protection.   ° ° °

## 2020-12-21 NOTE — Progress Notes (Signed)
   Follow-Up Visit   Subjective  Alex Bowers is a 65 y.o. male who presents for the following: Follow-up (6 month recheck. DN. 05/18/2020. Left anterior deltoid, severe / shave removal. Patient reports no recurrence. Well healed). The patient presents for Upper Body Skin Exam (UBSE) for skin cancer screening and mole check.  The following portions of the chart were reviewed this encounter and updated as appropriate:  Tobacco  Allergies  Meds  Problems  Med Hx  Surg Hx  Fam Hx     Review of Systems: No other skin or systemic complaints except as noted in HPI or Assessment and Plan.  Objective  Well appearing patient in no apparent distress; mood and affect are within normal limits.  A focused examination was performed including face, arms. Relevant physical exam findings are noted in the Assessment and Plan.   Assessment & Plan  Skin cancer screening    History of Dysplastic Nevi - No evidence of recurrence today at left anterior deltoid. Clear today. - Recommend regular full body skin exams - Recommend daily broad spectrum sunscreen SPF 30+ to sun-exposed areas, reapply every 2 hours as needed.  - Call if any new or changing lesions are noted between office visits   Lentigines - Scattered tan macules - Due to sun exposure - Benign-appering, observe - Recommend daily broad spectrum sunscreen SPF 30+ to sun-exposed areas, reapply every 2 hours as needed. - Call for any changes  Seborrheic Keratoses - Stuck-on, waxy, tan-brown papules and/or plaques  - Benign-appearing - Discussed benign etiology and prognosis. - Observe - Call for any changes  Melanocytic Nevi - Tan-brown and/or pink-flesh-colored symmetric macules and papules - Benign appearing on exam today - Observation - Call clinic for new or changing moles - Recommend daily use of broad spectrum spf 30+ sunscreen to sun-exposed areas.   Hemangiomas - Red papules - Discussed benign nature -  Observe - Call for any changes  Actinic Damage - Chronic condition, secondary to cumulative UV/sun exposure - diffuse scaly erythematous macules with underlying dyspigmentation - Recommend daily broad spectrum sunscreen SPF 30+ to sun-exposed areas, reapply every 2 hours as needed.  - Staying in the shade or wearing long sleeves, sun glasses (UVA+UVB protection) and wide brim hats (4-inch brim around the entire circumference of the hat) are also recommended for sun protection.  - Call for new or changing lesions.  Skin cancer screening performed today.  Return in about 6 months (around 06/23/2021) for TBSE.   I, Emelia Salisbury, CMA, am acting as scribe for Sarina Ser, MD.  Documentation: I have reviewed the above documentation for accuracy and completeness, and I agree with the above.  Sarina Ser, MD

## 2020-12-23 ENCOUNTER — Other Ambulatory Visit: Payer: Self-pay | Admitting: Nurse Practitioner

## 2020-12-23 ENCOUNTER — Encounter: Payer: Self-pay | Admitting: Dermatology

## 2020-12-23 DIAGNOSIS — M25511 Pain in right shoulder: Secondary | ICD-10-CM

## 2020-12-28 ENCOUNTER — Other Ambulatory Visit: Payer: Self-pay | Admitting: Nurse Practitioner

## 2020-12-28 DIAGNOSIS — I10 Essential (primary) hypertension: Secondary | ICD-10-CM

## 2020-12-28 DIAGNOSIS — E782 Mixed hyperlipidemia: Secondary | ICD-10-CM

## 2021-01-21 ENCOUNTER — Other Ambulatory Visit: Payer: Self-pay | Admitting: Nurse Practitioner

## 2021-01-21 DIAGNOSIS — M25511 Pain in right shoulder: Secondary | ICD-10-CM

## 2021-01-24 ENCOUNTER — Other Ambulatory Visit: Payer: Self-pay | Admitting: Nurse Practitioner

## 2021-01-24 DIAGNOSIS — I10 Essential (primary) hypertension: Secondary | ICD-10-CM

## 2021-01-24 DIAGNOSIS — M25511 Pain in right shoulder: Secondary | ICD-10-CM

## 2021-02-07 ENCOUNTER — Telehealth: Payer: Self-pay

## 2021-02-07 NOTE — Telephone Encounter (Signed)
Left vm to screen for 02/08/21 appointment-Toni 

## 2021-02-08 ENCOUNTER — Encounter: Payer: Self-pay | Admitting: Nurse Practitioner

## 2021-02-08 ENCOUNTER — Ambulatory Visit: Payer: Medicaid Other | Admitting: Nurse Practitioner

## 2021-02-08 ENCOUNTER — Other Ambulatory Visit: Payer: Self-pay

## 2021-02-08 ENCOUNTER — Other Ambulatory Visit
Admission: RE | Admit: 2021-02-08 | Discharge: 2021-02-08 | Disposition: A | Discharge status: Home / Self Care | Payer: Medicaid Other | Attending: Urology | Admitting: Urology

## 2021-02-08 VITALS — BP 150/110 | HR 72 | Temp 98.5°F | Resp 16 | Ht 68.0 in | Wt 149.8 lb

## 2021-02-08 DIAGNOSIS — I10 Essential (primary) hypertension: Secondary | ICD-10-CM

## 2021-02-08 DIAGNOSIS — I700 Atherosclerosis of aorta: Secondary | ICD-10-CM

## 2021-02-08 DIAGNOSIS — G8929 Other chronic pain: Secondary | ICD-10-CM

## 2021-02-08 DIAGNOSIS — N401 Enlarged prostate with lower urinary tract symptoms: Secondary | ICD-10-CM | POA: Diagnosis not present

## 2021-02-08 DIAGNOSIS — M25511 Pain in right shoulder: Secondary | ICD-10-CM

## 2021-02-08 DIAGNOSIS — N138 Other obstructive and reflux uropathy: Secondary | ICD-10-CM | POA: Diagnosis present

## 2021-02-08 DIAGNOSIS — F17210 Nicotine dependence, cigarettes, uncomplicated: Secondary | ICD-10-CM

## 2021-02-08 LAB — PSA: Prostatic Specific Antigen: 0.5 ng/mL (ref 0.00–4.00)

## 2021-02-08 MED ORDER — LISINOPRIL 10 MG PO TABS: 10.0000 mg | ORAL_TABLET | Freq: Every day | ORAL | 3 refills | Status: DC

## 2021-02-08 MED ORDER — BUPROPION HCL ER (XL) 300 MG PO TB24: 300.0000 mg | ORAL_TABLET | Freq: Every day | ORAL | 1 refills | Status: DC

## 2021-02-08 MED ORDER — MELOXICAM 15 MG PO TABS
15.0000 mg | ORAL_TABLET | Freq: Every day | ORAL | 1 refills | Status: DC
Start: 2021-02-08 — End: 2021-05-24

## 2021-02-08 NOTE — Progress Notes (Signed)
[facility] [place], [place] [address]  Internal MEDICINE  Office Visit Note  Patient Name: Alex Bowers  [id number]  [id number]  Date of Service: 02/16/2021  Chief Complaint  Patient presents with   Follow-up    [place] results, med refill   Hypertension    HPI Furious presents for a follow up visit for hypertension, ultrasound results and medication refill. He has a history of  stroke, hyperlipidemia, and hypertension. He has been working on smoking cessation. He was unsuccessful with nicotine lozenges and with bupropion 150 mg.  -discussed carotid ultrasound results: some plaque formation but no hemodynamically significant stenosis was seen bilaterally.  -Blood pressure is elevated 150/110. Rechecked manually and it was 140/92. Taking lisinopril 10mg  daily. -Due for annual physical exam in august.    Current Medication: Outpatient Encounter Medications as of 02/08/2021  Medication Sig   aspirin EC 81 MG tablet Take 81 mg by mouth daily.   buPROPion (WELLBUTRIN XL) 300 MG 24 hr tablet Take 1 tablet (300 mg total) by mouth daily.   citalopram (CELEXA) 40 MG tablet Take 20 mg by mouth daily.    meloxicam (MOBIC) 15 MG tablet Take 1 tablet (15 mg total) by mouth daily.   rosuvastatin (CRESTOR) 10 MG tablet Take 1 tablet (10 mg total) by mouth daily. For high cholesterol   [DISCONTINUED] buPROPion (WELLBUTRIN SR) 150 MG 12 hr tablet Take 150 mg by mouth daily.   [DISCONTINUED] finasteride (PROSCAR) 5 MG tablet Take 1 tablet (5 mg total) by mouth daily.   [DISCONTINUED] lisinopril (ZESTRIL) 10 MG tablet Take 1 tablet (10 mg total) by mouth daily.   [DISCONTINUED] meloxicam (MOBIC) 7.5 MG tablet Take 1 tablet (7.5 mg total) by mouth 2 (two) times daily as needed for pain.   [DISCONTINUED] tadalafil (CIALIS) 5 MG tablet Take 1 tablet (5 mg total) by mouth daily as needed for erectile dysfunction.   lisinopril (ZESTRIL) 10 MG tablet Take 1 tablet (10  mg total) by mouth daily.   [DISCONTINUED] ADDERALL XR 15 MG 24 hr capsule TAKE 1 TABLET IN AFTERNOON (Patient not taking: Reported on 02/08/2021)   [DISCONTINUED] amphetamine-dextroamphetamine (ADDERALL) 5 MG tablet Take 5 mg by mouth daily. (Patient not taking: Reported on 02/08/2021)   [DISCONTINUED] Fish Oil-Cholecalciferol (FISH OIL + D3 PO) Take by mouth. (Patient not taking: Reported on 02/08/2021)   [DISCONTINUED] Multiple Vitamin (MULTI VITAMIN MENS PO) Take by mouth. (Patient not taking: Reported on 02/08/2021)   [DISCONTINUED] triamcinolone (KENALOG) 0.025 % cream Apply 1 application topically 2 (two) times daily. (Patient not taking: Reported on 02/08/2021)   No facility-administered encounter medications on file as of 02/08/2021.    Surgical History: Past Surgical History:  Procedure Laterality Date   COLONOSCOPY  2015   HERNIA REPAIR Right 06/09/14   inguinal    teeth removal       Medical History: Past Medical History:  Diagnosis Date   Dysplastic nevus 07/08/2014   Right proximal lateral deltoid. Sever atypia, lateral margin involved. Excised 09/07/2014, margins free.   Dysplastic nevus 11/16/2014   Right distal post. deltoid. Melanocytic hyperplasia and incidental dysplastic nevus with mild atypia, margins free. Excised.   History of dysplastic nevus 05/18/2020   left anterior deltoid, severe / shave removal   Hyperlipidemia    Hypertension    Personal history of tobacco use, presenting hazards to health 12/29/2015   Stroke [facility]) 2014    Family History: Family History  Problem Relation Age of Onset  Cancer - Other Brother        phageal   Heart attack Father    Colon polyps Mother    Prostate cancer Neg Hx    Kidney cancer Neg Hx    Bladder Cancer Neg Hx     Social History   Socioeconomic History   Marital status: Divorced    Spouse name: Not on file   Number of children: Not on file   Years of education: Not on file   Highest education level: Not on  file  Occupational History   Not on file  Tobacco Use   Smoking status: Every Day    Packs/day: 2.00    Years: 37.00    Pack years: 74.00    Types: Cigarettes   Smokeless tobacco: Never  Substance and Sexual Activity   Alcohol use: Yes    Comment: occasionally   Drug use: No   Sexual activity: Not on file  Other Topics Concern   Not on file  Social History Narrative   Not on file   Social Determinants of Health   Financial Resource Strain: Not on file  Food Insecurity: Not on file  Transportation Needs: Not on file  Physical Activity: Not on file  Stress: Not on file  Social Connections: Not on file  Intimate Partner Violence: Not on file      Review of Systems  Constitutional:  Negative for chills, fatigue and unexpected weight change.  HENT:  Negative for congestion, rhinorrhea, sneezing and sore throat.   Eyes:  Negative for redness.  Respiratory:  Negative for cough, chest tightness and shortness of breath.   Cardiovascular:  Negative for chest pain and palpitations.  Gastrointestinal:  Negative for abdominal pain, constipation, diarrhea, nausea and vomiting.  Genitourinary:  Negative for dysuria and frequency.  Musculoskeletal:  Negative for arthralgias, back pain, joint swelling and neck pain.  Skin:  Negative for rash.  Neurological: Negative.  Negative for tremors and numbness.  Hematological:  Negative for adenopathy. Does not bruise/bleed easily.  Psychiatric/Behavioral:  Negative for behavioral problems (Depression), sleep disturbance and suicidal ideas. The patient is not nervous/anxious.    Vital Signs: BP (!) 150/110   Pulse 72   Temp 98.5 F (36.9 C)   Resp 16   Ht 5\' 8"  (1.727 m)   Wt 149 lb 12.8 oz (67.9 kg)   SpO2 97%   BMI 22.78 kg/m    Physical Exam Vitals reviewed.  Constitutional:      General: He is not in acute distress.    Appearance: Normal appearance. He is normal weight. He is not ill-appearing.  HENT:     Head:  Normocephalic and atraumatic.  Cardiovascular:     Rate and Rhythm: Normal rate and regular rhythm.     Pulses: Normal pulses.     Heart sounds: Normal heart sounds.  Pulmonary:     Effort: Pulmonary effort is normal. No respiratory distress.     Breath sounds: Normal breath sounds.  Skin:    General: Skin is warm and dry.     Capillary Refill: Capillary refill takes less than 2 seconds.  Neurological:     Mental Status: He is alert and oriented to person, place, and time.     Assessment/Plan: 1. Essential hypertension No change is current medication regimen, refill ordered.  - lisinopril (ZESTRIL) 10 MG tablet; Take 1 tablet (10 mg total) by mouth daily.  Dispense: 90 tablet; Refill: 3  2. Cigarette nicotine dependence without complication Increased  done of bupropion to aid in smoking cessation.  - buPROPion (WELLBUTRIN XL) 300 MG 24 hr tablet; Take 1 tablet (300 mg total) by mouth daily.  Dispense: 90 tablet; Refill: 1  3. Atherosclerosis of aorta ([place]) Bilateral carotid ultrasound was done in April 2022, discussed results with patient today. No record of an echocardiogram. Echocardiogram ordered to be done at [facility].  - ECHOCARDIOGRAM COMPLETE; Future  4. Chronic right shoulder pain Meloxicam dose increased.  - meloxicam (MOBIC) 15 MG tablet; Take 1 tablet (15 mg total) by mouth daily.  Dispense: 90 tablet; Refill: 1   General Counseling: Klye verbalizes understanding of the findings of todays visit and agrees with plan of treatment. I have discussed any further diagnostic evaluation that may be needed or ordered today. We also reviewed his medications today. he has been encouraged to call the office with any questions or concerns that should arise related to todays visit.    Orders Placed This Encounter  Procedures   ECHOCARDIOGRAM COMPLETE     Meds ordered this encounter  Medications   buPROPion (WELLBUTRIN XL) 300 MG 24 hr tablet    Sig: Take 1 tablet  (300 mg total) by mouth daily.    Dispense:  90 tablet    Refill:  1   lisinopril (ZESTRIL) 10 MG tablet    Sig: Take 1 tablet (10 mg total) by mouth daily.    Dispense:  90 tablet    Refill:  3   meloxicam (MOBIC) 15 MG tablet    Sig: Take 1 tablet (15 mg total) by mouth daily.    Dispense:  90 tablet    Refill:  1    Return for F/U previously scheduled in August with Lillar Bianca.   Total time spent:30 Minutes Time spent includes review of chart, medications, test results, and follow up plan with the patient.   [place] Controlled Substance Database was reviewed by me.  This patient was seen by Jonetta Osgood, FNP-C in collaboration with Dr. Clayborn Bigness as a part of collaborative care agreement.   Jeydi Klingel R. Valetta Fuller, MSN, FNP-C Internal medicine

## 2021-02-10 ENCOUNTER — Other Ambulatory Visit: Payer: Self-pay

## 2021-02-14 NOTE — Progress Notes (Signed)
08/08/2018 3:35 PM   MARCANTHONY SLEIGHT Sep 07, 1955 [id number]  Referring provider: [name], [address] [place],  [place] [address]  Urological history: 1. High risk hematuria -smoker -CTU NED 2017 -cysto impressive intraluminal bilobar hypertrophy 2017 -no reports of gross hematuria -UA neg for micro heme  2. BPH with LU TS -cysto impressive intraluminal bilobar hypertrophy 2017 -PSA 0.5 in 01/2021 -I PSS 10/3 -PVR 0 mL -managed with tadalafil 5 mg daily and finasteride 5 mg daily   3. Urgency -contributing factors of age, BPH, BP meds, smoking,  -failed OAB meds -deemed a poor candidate for Interstim and Botox -insurance would not cover PTNS  4. ED -contributing factors of age, BPH, HTN and smoking    Chief Complaint  Patient presents with   Benign Prostatic Hypertrophy     HPI: Alex Bowers is a 65 y.o. male who presents for follow up.   He still has some urgency, but it is milder.  Patient denies any modifying or aggravating factors.  Patient denies any gross hematuria, dysuria or suprapubic/flank pain.  Patient denies any fevers, chills, nausea or vomiting.    IPSS     Row Name 02/15/21 1500         International Prostate Symptom Score   How often have you had the sensation of not emptying your bladder? Less than 1 in 5     How often have you had to urinate less than every two hours? Less than 1 in 5 times     How often have you found you stopped and started again several times when you urinated? Less than half the time     How often have you found it difficult to postpone urination? Less than half the time     How often have you had a weak urinary stream? Less than half the time     How often have you had to strain to start urination? Not at All     How many times did you typically get up at night to urinate? 2 Times     Total IPSS Score 10           Quality of Life due to urinary symptoms     If you were to spend the  rest of your life with your urinary condition just the way it is now how would you feel about that? Mixed              Score:  1-7 Mild 8-19 Moderate 20-35 Severe  Patient is not having spontaneous erections.   He denies any pain or curvature with erections.     SHIM     Row Name 02/15/21 1511         SHIM: Over the last 6 months:   How do you rate your confidence that you could get and keep an erection? Moderate     When you had erections with sexual stimulation, how often were your erections hard enough for penetration (entering your partner)? Sometimes (about half the time)     During sexual intercourse, how often were you able to maintain your erection after you had penetrated (entered) your partner? Sometimes (about half the time)     During sexual intercourse, how difficult was it to maintain your erection to completion of intercourse? Difficult     When you attempted sexual intercourse, how often was it satisfactory for you? Sometimes (about half the time)  SHIM Total Score     SHIM 15               Score: 1-7 Severe ED 8-11 Moderate ED 12-16 Mild-Moderate ED 17-21 Mild ED 22-25 No ED   PMH: Past Medical History:  Diagnosis Date   Dysplastic nevus 07/08/2014   Right proximal lateral deltoid. Sever atypia, lateral margin involved. Excised 09/07/2014, margins free.   Dysplastic nevus 11/16/2014   Right distal post. deltoid. Melanocytic hyperplasia and incidental dysplastic nevus with mild atypia, margins free. Excised.   History of dysplastic nevus 05/18/2020   left anterior deltoid, severe / shave removal   Hyperlipidemia    Hypertension    Personal history of tobacco use, presenting hazards to health 12/29/2015   Stroke [facility]) 2014    Surgical History: Past Surgical History:  Procedure Laterality Date   COLONOSCOPY  2015   HERNIA REPAIR Right 06/09/14   inguinal    teeth removal       Home Medications:  Allergies as of 02/15/2021    No Known Allergies      Medication List        Accurate as of February 15, 2021  3:35 PM. If you have any questions, ask your nurse or doctor.          Adderall XR 20 MG 24 hr capsule Generic drug: amphetamine-dextroamphetamine Take 20 mg by mouth every morning.   aspirin EC 81 MG tablet Take 81 mg by mouth daily.   buPROPion 300 MG 24 hr tablet Commonly known as: WELLBUTRIN XL Take 1 tablet (300 mg total) by mouth daily.   citalopram 40 MG tablet Commonly known as: CELEXA Take 20 mg by mouth daily.   finasteride 5 MG tablet Commonly known as: PROSCAR Take 1 tablet (5 mg total) by mouth daily.   lisinopril 10 MG tablet Commonly known as: ZESTRIL Take 1 tablet (10 mg total) by mouth daily.   meloxicam 15 MG tablet Commonly known as: MOBIC Take 1 tablet (15 mg total) by mouth daily.   rosuvastatin 10 MG tablet Commonly known as: Crestor Take 1 tablet (10 mg total) by mouth daily. For high cholesterol   tadalafil 5 MG tablet Commonly known as: CIALIS Take 1 tablet (5 mg total) by mouth daily as needed for erectile dysfunction.        Allergies: No Known Allergies  Family History: Family History  Problem Relation Age of Onset   Cancer - Other Brother        phageal   Heart attack Father    Colon polyps Mother    Prostate cancer Neg Hx    Kidney cancer Neg Hx    Bladder Cancer Neg Hx     Social History:  reports that he has been smoking cigarettes. He has a 74.00 pack-year smoking history. He has never used smokeless tobacco. He reports current alcohol use. He reports that he does not use drugs.  ROS: For pertinent review of systems please refer to history of present illness  Physical Exam: BP (!) 158/95   Pulse 73   Ht 5\' 8"  (1.727 m)   Wt 151 lb (68.5 kg)   BMI 22.96 kg/m   Constitutional:  Well nourished. Alert and oriented, No acute distress. HEENT:  AT, mask in place.  Trachea midline Cardiovascular: No clubbing, cyanosis, or  edema. Respiratory: Normal respiratory effort, no increased work of breathing. GU: No CVA tenderness.  No bladder fullness or masses.  Patient with circumcised phallus.  Urethral  meatus is patent.  No penile discharge. No penile lesions or rashes. Scrotum without lesions, cysts, rashes and/or edema.  Testicles are located scrotally bilaterally. No masses are appreciated in the testicles. Left and right epididymis are normal. Rectal: Patient with  normal sphincter tone. Anus and perineum without scarring or rashes. No rectal masses are appreciated. Prostate is approximately 45 grams, no nodules are appreciated. Seminal vesicles could not be palpated Neurologic: Grossly intact, no focal deficits, moving all 4 extremities. Psychiatric: Normal mood and affect.   Laboratory Data: Component     Latest Ref Rng & Units 02/08/2021  Prostatic Specific Antigen     0.00 - 4.00 ng/mL 0.50   Urinalysis  Component     Latest Ref Rng & Units 02/15/2021  Specific Gravity, UA     1.005 - 1.030 1.020  pH, UA     5.0 - 7.5 6.0  Color, UA     Yellow Yellow  Appearance Ur     Clear Clear  Leukocytes,UA     Negative 1+ (A)  Protein,UA     Negative/Trace Negative  Glucose, UA     Negative Negative  Ketones, UA     Negative Negative  RBC, UA     Negative Negative  Bilirubin, UA     Negative Negative  Urobilinogen, Ur     0.2 - 1.0 mg/dL 0.2  Nitrite, UA     Negative Negative  Microscopic Examination      See below:   Component     Latest Ref Rng & Units 02/15/2021  WBC, UA     0 - 5 /hpf 6-10 (A)  RBC     0 - 2 /hpf 0-2  Epithelial Cells (non renal)     0 - 10 /hpf 0-10  Bacteria, UA     None seen/Few None seen  I have reviewed the labs.  Pertinent Imaging Results for DEMARR, KLUEVER (MRN [id number]) as of 02/15/2021 15:16  Ref. Range 02/15/2021 15:10  Scan Result Unknown 0 ml    Assessment & Plan:    1. Urgency -Failed Myrbetriq - ADR of diarrhea and failed Toviaz 8 mg daily  - ADR of constipation -Discussed PT and PTNS - insurance will not cover  -Not a candidate for Botox or Interstim -symptoms are milder  2. BPH with LU TS -Urgency is most bothersome symptom - need to manage conservatively - see above  -Continue finasteride 5 mg daily and Cialis 5 mg daily - refills given  3. Erectile dysfunction  -Continue Cialis 5 mg daily  -increase to tadalafil 5 mg, up to 4 tablets for intercourse   4. History of hematuria -Hematuria work up completed in 08/2015 - findings positive for friable enlarged prostate - continue finasteride -No report of gross hematuria  -UA today is negative for [facility]  Return in about 1 year (around 02/15/2022) for IPSS, SHIM, UA, PSA and exam.  These notes generated with voice recognition software. I apologize for typographical errors.  Zara Council, PA-C  [facility] [address] [place], [place] [address] [phone]

## 2021-02-15 ENCOUNTER — Other Ambulatory Visit: Payer: Self-pay

## 2021-02-15 ENCOUNTER — Encounter: Payer: Self-pay | Admitting: Urology

## 2021-02-15 ENCOUNTER — Ambulatory Visit (INDEPENDENT_AMBULATORY_CARE_PROVIDER_SITE_OTHER): Payer: Medicaid Other | Admitting: Urology

## 2021-02-15 VITALS — BP 158/95 | HR 73 | Ht 68.0 in | Wt 151.0 lb

## 2021-02-15 DIAGNOSIS — N529 Male erectile dysfunction, unspecified: Secondary | ICD-10-CM | POA: Diagnosis not present

## 2021-02-15 DIAGNOSIS — R3915 Urgency of urination: Secondary | ICD-10-CM

## 2021-02-15 DIAGNOSIS — N401 Enlarged prostate with lower urinary tract symptoms: Secondary | ICD-10-CM

## 2021-02-15 DIAGNOSIS — R319 Hematuria, unspecified: Secondary | ICD-10-CM

## 2021-02-15 DIAGNOSIS — N138 Other obstructive and reflux uropathy: Secondary | ICD-10-CM | POA: Diagnosis not present

## 2021-02-15 LAB — BLADDER SCAN AMB NON-IMAGING: Scan Result: 0

## 2021-02-15 MED ORDER — FINASTERIDE 5 MG PO TABS: 5.0000 mg | ORAL_TABLET | Freq: Every day | ORAL | 3 refills | Status: DC

## 2021-02-15 MED ORDER — TADALAFIL 5 MG PO TABS: 5.0000 mg | ORAL_TABLET | Freq: Every day | ORAL | 3 refills | Status: DC | PRN

## 2021-02-16 ENCOUNTER — Ambulatory Visit: Payer: Self-pay | Admitting: Urology

## 2021-02-16 LAB — MICROSCOPIC EXAMINATION: Bacteria, UA: NONE SEEN

## 2021-02-16 LAB — URINALYSIS, COMPLETE
Bilirubin, UA: NEGATIVE
Glucose, UA: NEGATIVE
Ketones, UA: NEGATIVE
Nitrite, UA: NEGATIVE
Protein,UA: NEGATIVE
RBC, UA: NEGATIVE
Specific Gravity, UA: 1.02 (ref 1.005–1.030)
Urobilinogen, Ur: 0.2 mg/dL (ref 0.2–1.0)
pH, UA: 6 (ref 5.0–7.5)

## 2021-03-22 ENCOUNTER — Telehealth: Payer: Self-pay

## 2021-03-22 NOTE — Telephone Encounter (Signed)
Completed medical records for [place] to [phone]

## 2021-04-18 ENCOUNTER — Telehealth: Payer: Self-pay

## 2021-04-18 NOTE — Telephone Encounter (Signed)
Left vm to confirm 04/19/21 appointment-Toni

## 2021-04-19 ENCOUNTER — Other Ambulatory Visit: Payer: Self-pay

## 2021-04-19 ENCOUNTER — Ambulatory Visit: Payer: Medicaid Other | Admitting: Nurse Practitioner

## 2021-04-19 ENCOUNTER — Encounter: Payer: Self-pay | Admitting: Nurse Practitioner

## 2021-04-19 VITALS — BP 140/90 | HR 82 | Temp 98.4°F | Resp 16 | Ht 68.0 in | Wt 150.4 lb

## 2021-04-19 DIAGNOSIS — M542 Cervicalgia: Secondary | ICD-10-CM | POA: Diagnosis not present

## 2021-04-19 DIAGNOSIS — G8929 Other chronic pain: Secondary | ICD-10-CM

## 2021-04-19 DIAGNOSIS — M25511 Pain in right shoulder: Secondary | ICD-10-CM

## 2021-04-19 DIAGNOSIS — Z0001 Encounter for general adult medical examination with abnormal findings: Secondary | ICD-10-CM | POA: Diagnosis not present

## 2021-04-19 DIAGNOSIS — E559 Vitamin D deficiency, unspecified: Secondary | ICD-10-CM

## 2021-04-19 DIAGNOSIS — I700 Atherosclerosis of aorta: Secondary | ICD-10-CM | POA: Diagnosis not present

## 2021-04-19 DIAGNOSIS — F17210 Nicotine dependence, cigarettes, uncomplicated: Secondary | ICD-10-CM | POA: Diagnosis not present

## 2021-04-19 DIAGNOSIS — I10 Essential (primary) hypertension: Secondary | ICD-10-CM

## 2021-04-19 DIAGNOSIS — R300 Dysuria: Secondary | ICD-10-CM

## 2021-04-19 DIAGNOSIS — E782 Mixed hyperlipidemia: Secondary | ICD-10-CM

## 2021-04-19 MED ORDER — ROSUVASTATIN CALCIUM 10 MG PO TABS: 10.0000 mg | ORAL_TABLET | Freq: Every day | ORAL | 1 refills | Status: DC

## 2021-04-19 MED ORDER — LISINOPRIL 20 MG PO TABS: 20.0000 mg | ORAL_TABLET | Freq: Every day | ORAL | 3 refills | Status: DC

## 2021-04-19 MED ORDER — METHOCARBAMOL 500 MG PO TABS: 500.0000 mg | ORAL_TABLET | Freq: Three times a day (TID) | ORAL | 0 refills | Status: DC | PRN

## 2021-04-19 NOTE — Progress Notes (Signed)
[facility] [place], [place] [id number]  Internal MEDICINE  Office Visit Note  Patient Name: Alex Bowers  [id number]  [id number]  Date of Service: 04/19/2021  Chief Complaint  Patient presents with   Annual Exam    Feels like muscle was pulled right side of head/neck area    Hypertension   Hyperlipidemia    HPI Wilbert presents for an annual well visit and physical exam. he has a history of stroke, hyperlipidemia, hypertension, and a hernia reair. He has not had the COVID vaccine and is not interested in getting it. He has declined other recommended vaccines. His PSA level was checked in June for prostate cancer screening and it was normal. He has an ultrasound of the bilateral carotid arteries in April this year and the result was no significant stenosis noted bilaterally. He had a CT chest for lung cancer screening in September 2021 that was LUNG-RADS 2 ith no significant changes noted and incidental findings included three-vessel coronary atherosclerosis and aortic atherosclerosis.  Echocardiogram was ordered in June this year but has not been done yet. He is due for labs.  He had a colonoscopy in 2015 and 2 polyps were removed and both were benign. He is due for colonoscopy in 2025.  He denies any pain except that he feels like he pulled a muscle in his neck.  He has not other concerns or questions today.    Current Medication: Outpatient Encounter Medications as of 04/19/2021  Medication Sig   aspirin EC 81 MG tablet Take 81 mg by mouth daily.   buPROPion (WELLBUTRIN XL) 300 MG 24 hr tablet Take 1 tablet (300 mg total) by mouth daily.   citalopram (CELEXA) 40 MG tablet Take 20 mg by mouth daily.    finasteride (PROSCAR) 5 MG tablet Take 1 tablet (5 mg total) by mouth daily.   lisinopril (ZESTRIL) 20 MG tablet Take 1 tablet (20 mg total) by mouth daily.   meloxicam (MOBIC) 15 MG tablet Take 1 tablet (15 mg total) by mouth daily.    methocarbamol (ROBAXIN) 500 MG tablet Take 1 tablet (500 mg total) by mouth 3 (three) times daily as needed for muscle spasms.   tadalafil (CIALIS) 5 MG tablet Take 1 tablet (5 mg total) by mouth daily as needed for erectile dysfunction.   [DISCONTINUED] lisinopril (ZESTRIL) 10 MG tablet Take 1 tablet (10 mg total) by mouth daily.   [DISCONTINUED] rosuvastatin (CRESTOR) 10 MG tablet Take 1 tablet (10 mg total) by mouth daily. For high cholesterol   rosuvastatin (CRESTOR) 10 MG tablet Take 1 tablet (10 mg total) by mouth daily. For high cholesterol   No facility-administered encounter medications on file as of 04/19/2021.    Surgical History: Past Surgical History:  Procedure Laterality Date   COLONOSCOPY  2015   HERNIA REPAIR Right 06/09/14   inguinal    teeth removal       Medical History: Past Medical History:  Diagnosis Date   Dysplastic nevus 07/08/2014   Right proximal lateral deltoid. Sever atypia, lateral margin involved. Excised 09/07/2014, margins free.   Dysplastic nevus 11/16/2014   Right distal post. deltoid. Melanocytic hyperplasia and incidental dysplastic nevus with mild atypia, margins free. Excised.   History of dysplastic nevus 05/18/2020   left anterior deltoid, severe / shave removal   Hyperlipidemia    Hypertension    Personal history of tobacco use, presenting hazards to health 12/29/2015   Stroke [facility]) 2014    Family History:  Family History  Problem Relation Age of Onset   Cancer - Other Brother        phageal   Heart attack Father    Colon polyps Mother    Prostate cancer Neg Hx    Kidney cancer Neg Hx    Bladder Cancer Neg Hx     Social History   Socioeconomic History   Marital status: Divorced    Spouse name: Not on file   Number of children: Not on file   Years of education: Not on file   Highest education level: Not on file  Occupational History   Not on file  Tobacco Use   Smoking status: Every Day    Packs/day: 2.00    Years: 37.00     Pack years: 74.00    Types: Cigarettes   Smokeless tobacco: Never  Substance and Sexual Activity   Alcohol use: Yes    Comment: occasionally   Drug use: No   Sexual activity: Not on file  Other Topics Concern   Not on file  Social History Narrative   Not on file   Social Determinants of Health   Financial Resource Strain: Not on file  Food Insecurity: Not on file  Transportation Needs: Not on file  Physical Activity: Not on file  Stress: Not on file  Social Connections: Not on file  Intimate Partner Violence: Not on file      Review of Systems  Constitutional:  Negative for activity change, appetite change, chills, fatigue, fever and unexpected weight change.  HENT: Negative.  Negative for congestion, ear pain, rhinorrhea, sore throat and trouble swallowing.   Eyes: Negative.   Respiratory: Negative.  Negative for cough, chest tightness, shortness of breath and wheezing.   Cardiovascular: Negative.  Negative for chest pain.  Gastrointestinal: Negative.  Negative for abdominal pain, blood in stool, constipation, diarrhea, nausea and vomiting.  Endocrine: Negative.   Genitourinary: Negative.  Negative for difficulty urinating, dysuria, frequency, hematuria and urgency.  Musculoskeletal: Negative.  Negative for arthralgias, back pain, joint swelling, myalgias and neck pain.  Skin: Negative.  Negative for rash and wound.  Allergic/Immunologic: Negative.  Negative for immunocompromised state.  Neurological: Negative.  Negative for dizziness, seizures, numbness and headaches.  Hematological: Negative.   Psychiatric/Behavioral: Negative.  Negative for behavioral problems, self-injury and suicidal ideas. The patient is not nervous/anxious.    Vital Signs: BP 140/90 Comment: 148/96  Pulse 82   Temp 98.4 F (36.9 C)   Resp 16   Ht _0  (1.727 m)   Wt 150 lb 6.4 oz (68.2 kg)   SpO2 97%   BMI 22.87 kg/m    Physical Exam Vitals reviewed.  Constitutional:       General: He is awake. He is not in acute distress.    Appearance: Normal appearance. He is well-developed, well-groomed and normal weight. He is not ill-appearing or diaphoretic.  HENT:     Head: Normocephalic and atraumatic.     Right Ear: Tympanic membrane, ear canal and external ear normal.     Left Ear: Tympanic membrane, ear canal and external ear normal.     Nose: Nose normal. No congestion or rhinorrhea.     Mouth/Throat:     Lips: Pink.     Mouth: Mucous membranes are moist.     Pharynx: Oropharynx is clear. Uvula midline. No oropharyngeal exudate.  Eyes:     General: Lids are normal. Vision grossly intact. Gaze aligned appropriately. No scleral icterus.  Right eye: No discharge.        Left eye: No discharge.     Extraocular Movements: Extraocular movements intact.     Conjunctiva/sclera: Conjunctivae normal.     Pupils: Pupils are equal, round, and reactive to light.     Funduscopic exam:    Right eye: Red reflex present.        Left eye: Red reflex present. Neck:     Thyroid: No thyromegaly.     Vascular: No carotid bruit or JVD.     Trachea: Trachea and phonation normal. No tracheal deviation.  Cardiovascular:     Rate and Rhythm: Normal rate and regular rhythm.     Pulses:          Carotid pulses are 3+ on the right side and 3+ on the left side.      Radial pulses are 2+ on the right side and 2+ on the left side.       Posterior tibial pulses are 2+ on the right side and 2+ on the left side.     Heart sounds: Normal heart sounds, S1 normal and S2 normal. No murmur heard.   No friction rub. No gallop.  Pulmonary:     Effort: Pulmonary effort is normal. No respiratory distress.     Breath sounds: Normal breath sounds. No stridor. No wheezing or rales.  Chest:     Chest wall: No tenderness.  Abdominal:     General: Bowel sounds are normal. There is no distension.     Palpations: Abdomen is soft. There is no mass.     Tenderness: There is no abdominal  tenderness. There is no guarding or rebound.  Musculoskeletal:        General: No deformity.     Cervical back: Neck supple. Tenderness present. Decreased range of motion (due to possible pulled muscle.).     Right lower leg: No edema.     Left lower leg: No edema.  Lymphadenopathy:     Cervical: No cervical adenopathy.  Skin:    General: Skin is warm and dry.     Capillary Refill: Capillary refill takes less than 2 seconds.     Coloration: Skin is not pale.     Findings: No erythema or rash.  Neurological:     Mental Status: He is alert and oriented to person, place, and time.     Cranial Nerves: No cranial nerve deficit.     Motor: No abnormal muscle tone.     Coordination: Coordination normal.     Gait: Gait normal.     Deep Tendon Reflexes: Reflexes are normal and symmetric.  Psychiatric:        Mood and Affect: Mood normal.        Behavior: Behavior normal. Behavior is cooperative.        Thought Content: Thought content normal.        Judgment: Judgment normal.     Assessment/Plan: 1. Encounter for general adult medical examination with abnormal findings Age-appropriate preventive screenings and vaccinations discussed, annual physical exam completed. Routine labs for health maintenance ordered, see below. PHM updated.   2. Neck pain on right side Muscle relaxant prescribed to help with pain and spasms.  - methocarbamol (ROBAXIN) 500 MG tablet; Take 1 tablet (500 mg total) by mouth 3 (three) times daily as needed for muscle spasms.  Dispense: 30 tablet; Refill: 0  3. Cigarette nicotine dependence without complication Smokes 2 ppd, is interested in quitting but does  not want any medication or nicotine replacement therapy to help.  Smoking cessation counseling: Pt acknowledges the risks of long term smoking, she will try to quite smoking. Options for different medications including nicotine products, chewing gum, patch etc, Wellbutrin and Chantix is discussed. Patient  declined  Goal and date of compete cessation is discussed. Not ready to choose a date.  Total time spent in smoking cessation is 10 min.   4. Essential hypertension Blood pressure stable, continue medication as prescribed. He did not take his medication today.  - lisinopril (ZESTRIL) 20 MG tablet; Take 1 tablet (20 mg total) by mouth daily.  Dispense: 90 tablet; Refill: 3  5. Atherosclerosis of aorta ([place]) Routine labs ordered, continue statin therapy as prescribed, refill ordered.  - CBC with Differential/Platelet - CMP14+EGFR - Lipid Profile - TSH + free T4 - rosuvastatin (CRESTOR) 10 MG tablet; Take 1 tablet (10 mg total) by mouth daily. For high cholesterol  Dispense: 90 tablet; Refill: 1  6. Chronic right shoulder pain Takes meloxicam which helps, no refills needed at this time, continue as prescribed.   7. Vitamin D deficiency Rule out low vitamin D - Vitamin D (25 hydroxy)  8. Dysuria Routine urinalysis done - UA/M w/rflx Culture, Routine - Microscopic Examination - Urine Culture, Reflex      General Counseling: Brinden verbalizes understanding of the findings of todays visit and agrees with plan of treatment. I have discussed any further diagnostic evaluation that may be needed or ordered today. We also reviewed his medications today. he has been encouraged to call the office with any questions or concerns that should arise related to todays visit.    Orders Placed This Encounter  Procedures   Microscopic Examination   Urine Culture, Reflex   UA/M w/rflx Culture, Routine   CBC with Differential/Platelet   CMP14+EGFR   Lipid Profile   TSH + free T4   Vitamin D (25 hydroxy)    Meds ordered this encounter  Medications   methocarbamol (ROBAXIN) 500 MG tablet    Sig: Take 1 tablet (500 mg total) by mouth 3 (three) times daily as needed for muscle spasms.    Dispense:  30 tablet    Refill:  0   lisinopril (ZESTRIL) 20 MG tablet    Sig: Take 1 tablet (20  mg total) by mouth daily.    Dispense:  90 tablet    Refill:  3   rosuvastatin (CRESTOR) 10 MG tablet    Sig: Take 1 tablet (10 mg total) by mouth daily. For high cholesterol    Dispense:  90 tablet    Refill:  1    Return in 3 months (on 07/19/2021) for F/U, BP check, Kelvin Sennett PCP.   Total time spent:30 Minutes Time spent includes review of chart, medications, test results, and follow up plan with the patient.   [place] Controlled Substance Database was reviewed by me.  This patient was seen by Jonetta Osgood, FNP-C in collaboration with Dr. Clayborn Bigness as a part of collaborative care agreement.  Shakaya Bhullar R. Valetta Fuller, MSN, FNP-C Internal medicine

## 2021-04-21 ENCOUNTER — Encounter: Payer: Medicaid Other | Admitting: Hospice and Palliative Medicine

## 2021-04-21 LAB — MICROSCOPIC EXAMINATION
Bacteria, UA: NONE SEEN
Casts: NONE SEEN /lpf
Epithelial Cells (non renal): NONE SEEN /hpf (ref 0–10)
RBC, Urine: NONE SEEN /hpf (ref 0–2)

## 2021-04-21 LAB — UA/M W/RFLX CULTURE, ROUTINE
Bilirubin, UA: NEGATIVE
Glucose, UA: NEGATIVE
Ketones, UA: NEGATIVE
Nitrite, UA: NEGATIVE
Protein,UA: NEGATIVE
RBC, UA: NEGATIVE
Specific Gravity, UA: 1.007 (ref 1.005–1.030)
Urobilinogen, Ur: 0.2 mg/dL (ref 0.2–1.0)
pH, UA: 6 (ref 5.0–7.5)

## 2021-04-21 LAB — URINE CULTURE, REFLEX

## 2021-05-07 ENCOUNTER — Encounter: Payer: Self-pay | Admitting: Nurse Practitioner

## 2021-05-24 ENCOUNTER — Encounter: Payer: Medicaid Other | Admitting: Dermatology

## 2021-05-24 ENCOUNTER — Ambulatory Visit
Admission: RE | Admit: 2021-05-24 | Discharge: 2021-05-24 | Disposition: A | Discharge status: Home / Self Care | Payer: Medicaid Other | Attending: Nurse Practitioner | Admitting: Nurse Practitioner

## 2021-05-24 ENCOUNTER — Ambulatory Visit
Admission: RE | Admit: 2021-05-24 | Discharge: 2021-05-24 | Disposition: A | Discharge status: Home / Self Care | Payer: Medicaid Other | Source: Ambulatory Visit | Attending: Nurse Practitioner | Admitting: Nurse Practitioner

## 2021-05-24 ENCOUNTER — Ambulatory Visit: Payer: Medicaid Other | Admitting: Nurse Practitioner

## 2021-05-24 ENCOUNTER — Encounter: Payer: Self-pay | Admitting: Nurse Practitioner

## 2021-05-24 ENCOUNTER — Other Ambulatory Visit: Payer: Self-pay

## 2021-05-24 VITALS — BP 179/99 | HR 76 | Temp 98.4°F | Resp 16 | Ht 68.0 in | Wt 151.8 lb

## 2021-05-24 DIAGNOSIS — M25551 Pain in right hip: Secondary | ICD-10-CM

## 2021-05-24 DIAGNOSIS — M5441 Lumbago with sciatica, right side: Secondary | ICD-10-CM

## 2021-05-24 DIAGNOSIS — R030 Elevated blood-pressure reading, without diagnosis of hypertension: Secondary | ICD-10-CM | POA: Diagnosis not present

## 2021-05-24 DIAGNOSIS — M542 Cervicalgia: Secondary | ICD-10-CM

## 2021-05-24 DIAGNOSIS — M25511 Pain in right shoulder: Secondary | ICD-10-CM | POA: Diagnosis not present

## 2021-05-24 DIAGNOSIS — G8929 Other chronic pain: Secondary | ICD-10-CM

## 2021-05-24 MED ORDER — METHOCARBAMOL 500 MG PO TABS: 500.0000 mg | ORAL_TABLET | Freq: Three times a day (TID) | ORAL | 0 refills | Status: DC | PRN

## 2021-05-24 MED ORDER — MELOXICAM 15 MG PO TABS: 15.0000 mg | ORAL_TABLET | Freq: Every day | ORAL | 1 refills | Status: DC

## 2021-05-24 MED ORDER — TRAMADOL HCL 50 MG PO TABS: 50.0000 mg | ORAL_TABLET | Freq: Four times a day (QID) | ORAL | 0 refills | Status: AC | PRN

## 2021-05-24 NOTE — Progress Notes (Signed)
[facility] [place], [place] [address]  Internal MEDICINE  Office Visit Note  Patient Name: Alex Bowers  [id number]  [id number]  Date of Service: 05/24/2021  Chief Complaint  Patient presents with   Acute Visit    Pain right hip down to the knee sometimes to ankle, started last week      HPI Tavio presents for an acute sick visit for right hip pain that radiates down to the knee and ankle. The pain started about a week ago. OTC meds and nonpharmacological interventions such as ice, heat, stretching and rest are limited in alleviating pain. The patient lives in [place] and is taking care of his father. He is unable to take a lot of time away from him. He states that physical therapy is not an option right now because of how much his father needs him.  Blood pressure is elevated    Current Medication:  Outpatient Encounter Medications as of 05/24/2021  Medication Sig   aspirin EC 81 MG tablet Take 81 mg by mouth daily.   buPROPion (WELLBUTRIN XL) 300 MG 24 hr tablet Take 1 tablet (300 mg total) by mouth daily.   citalopram (CELEXA) 40 MG tablet Take 20 mg by mouth daily.    finasteride (PROSCAR) 5 MG tablet Take 1 tablet (5 mg total) by mouth daily.   lisinopril (ZESTRIL) 20 MG tablet Take 1 tablet (20 mg total) by mouth daily.   rosuvastatin (CRESTOR) 10 MG tablet Take 1 tablet (10 mg total) by mouth daily. For high cholesterol   tadalafil (CIALIS) 5 MG tablet Take 1 tablet (5 mg total) by mouth daily as needed for erectile dysfunction.   [EXPIRED] traMADol (ULTRAM) 50 MG tablet Take 1 tablet (50 mg total) by mouth every 6 (six) hours as needed for up to 5 days for moderate pain or severe pain.   [DISCONTINUED] meloxicam (MOBIC) 15 MG tablet Take 1 tablet (15 mg total) by mouth daily.   [DISCONTINUED] methocarbamol (ROBAXIN) 500 MG tablet Take 1 tablet (500 mg total) by mouth 3 (three) times daily as needed for muscle spasms.    meloxicam (MOBIC) 15 MG tablet Take 1 tablet (15 mg total) by mouth daily.   methocarbamol (ROBAXIN) 500 MG tablet Take 1 tablet (500 mg total) by mouth 3 (three) times daily as needed for muscle spasms.   No facility-administered encounter medications on file as of 05/24/2021.      Medical History: Past Medical History:  Diagnosis Date   Dysplastic nevus 07/08/2014   Right proximal lateral deltoid. Sever atypia, lateral margin involved. Excised 09/07/2014, margins free.   Dysplastic nevus 11/16/2014   Right distal post. deltoid. Melanocytic hyperplasia and incidental dysplastic nevus with mild atypia, margins free. Excised.   History of dysplastic nevus 05/18/2020   left anterior deltoid, severe / shave removal   Hyperlipidemia    Hypertension    Personal history of tobacco use, presenting hazards to health 12/29/2015   Stroke ([place]) 2014     Vital Signs: BP (!) 179/99   Pulse 76   Temp 98.4 F (36.9 C)   Resp 16   Ht 5\' 8"  (1.727 m)   Wt 151 lb 12.8 oz (68.9 kg)   SpO2 98%   BMI 23.08 kg/m    Review of Systems  Constitutional:  Negative for chills, fatigue and unexpected weight change.  HENT:  Negative for congestion, rhinorrhea, sneezing and sore throat.   Eyes:  Negative for redness.  Respiratory:  Negative for cough, chest tightness and shortness of breath.   Cardiovascular:  Negative for chest pain and palpitations.  Gastrointestinal:  Negative for abdominal pain, constipation, diarrhea, nausea and vomiting.  Genitourinary:  Negative for dysuria and frequency.  Musculoskeletal:  Positive for arthralgias, back pain and neck pain. Negative for joint swelling.  Skin:  Negative for rash.  Neurological: Negative.  Negative for tremors and numbness.  Hematological:  Negative for adenopathy. Does not bruise/bleed easily.  Psychiatric/Behavioral:  Negative for behavioral problems (Depression), sleep disturbance and suicidal ideas. The patient is not nervous/anxious.     Physical Exam Vitals reviewed.  Constitutional:      General: He is not in acute distress.    Appearance: Normal appearance. He is normal weight. He is not ill-appearing.  HENT:     Head: Normocephalic and atraumatic.  Eyes:     Extraocular Movements: Extraocular movements intact.     Pupils: Pupils are equal, round, and reactive to light.  Cardiovascular:     Rate and Rhythm: Normal rate and regular rhythm.  Pulmonary:     Effort: Pulmonary effort is normal. No respiratory distress.  Neurological:     Mental Status: He is alert and oriented to person, place, and time.     Cranial Nerves: No cranial nerve deficit.     Coordination: Coordination normal.     Gait: Gait normal.  Psychiatric:        Mood and Affect: Mood normal.        Behavior: Behavior normal.      Assessment/Plan: 1. Acute right-sided low back pain with right-sided sciatica Tramadol prescribed, xray of lumbar spine ordered. Follow up in 6 weeks - traMADol (ULTRAM) 50 MG tablet; Take 1 tablet (50 mg total) by mouth every 6 (six) hours as needed for up to 5 days for moderate pain or severe pain.  Dispense: 20 tablet; Refill: 0 - DG Lumbar Spine Complete; Future- DG Hip Unilat W OR W/O Pelvis 2-3 Views Right; Future  2. Hypertension with elevated blood pressure  Blood pressure is elevated. Will monitor   3. Neck pain on right side Methocarbamol refill ordered  - methocarbamol (ROBAXIN) 500 MG tablet; Take 1 tablet (500 mg total) by mouth 3 (three) times daily as needed for muscle spasms.  Dispense: 90 tablet; Refill: 0  4. Chronic right shoulder pain Meloxicam reordered.  - meloxicam (MOBIC) 15 MG tablet; Take 1 tablet (15 mg total) by mouth daily.  Dispense: 90 tablet; Refill: 1   General Counseling: Sameer verbalizes understanding of the findings of todays visit and agrees with plan of treatment. I have discussed any further diagnostic evaluation that may be needed or ordered today. We also reviewed  his medications today. he has been encouraged to call the office with any questions or concerns that should arise related to todays visit.    Counseling: .   Orders Placed This Encounter  Procedures   DG Lumbar Spine Complete   DG Hip Unilat W OR W/O Pelvis 2-3 Views Right    Meds ordered this encounter  Medications   methocarbamol (ROBAXIN) 500 MG tablet    Sig: Take 1 tablet (500 mg total) by mouth 3 (three) times daily as needed for muscle spasms.    Dispense:  90 tablet    Refill:  0   meloxicam (MOBIC) 15 MG tablet    Sig: Take 1 tablet (15 mg total) by mouth daily.    Dispense:  90 tablet    Refill:  1   traMADol (ULTRAM) 50 MG tablet    Sig: Take 1 tablet (50 mg total) by mouth every 6 (six) hours as needed for up to 5 days for moderate pain or severe pain.    Dispense:  20 tablet    Refill:  0    Return in about 6 weeks (around 07/05/2021) for F/U, Jshon Ibe PCP.   Controlled Substance Database was reviewed by me for overdose risk score (ORS)  Time spent:30 Minutes Time spent with patient included reviewing progress notes, labs, imaging studies, and discussing plan for follow up.   This patient was seen by Jonetta Osgood, FNP-C in collaboration with Dr. Clayborn Bigness as a part of collaborative care agreement.  Emilia Kayes R. Valetta Fuller, MSN, FNP-C Internal Medicine

## 2021-06-11 ENCOUNTER — Encounter: Payer: Self-pay | Admitting: Nurse Practitioner

## 2021-06-19 ENCOUNTER — Other Ambulatory Visit: Payer: Self-pay | Admitting: Nurse Practitioner

## 2021-06-19 DIAGNOSIS — M542 Cervicalgia: Secondary | ICD-10-CM

## 2021-06-27 ENCOUNTER — Telehealth: Payer: Self-pay

## 2021-06-27 LAB — CMP14+EGFR
ALT: 19 IU/L (ref 0–44)
AST: 24 IU/L (ref 0–40)
Albumin/Globulin Ratio: 2 (ref 1.2–2.2)
Albumin: 4.4 g/dL (ref 3.8–4.8)
Alkaline Phosphatase: 56 IU/L (ref 44–121)
BUN/Creatinine Ratio: 9 — ABNORMAL LOW (ref 10–24)
BUN: 9 mg/dL (ref 8–27)
Bilirubin Total: 0.5 mg/dL (ref 0.0–1.2)
CO2: 23 mmol/L (ref 20–29)
Calcium: 9.3 mg/dL (ref 8.6–10.2)
Chloride: 96 mmol/L (ref 96–106)
Creatinine, Ser: 1.05 mg/dL (ref 0.76–1.27)
Globulin, Total: 2.2 g/dL (ref 1.5–4.5)
Glucose: 88 mg/dL (ref 70–99)
Potassium: 4.8 mmol/L (ref 3.5–5.2)
Sodium: 132 mmol/L — ABNORMAL LOW (ref 134–144)
Total Protein: 6.6 g/dL (ref 6.0–8.5)
eGFR: 79 mL/min/{1.73_m2} (ref >59)

## 2021-06-27 LAB — CBC WITH DIFFERENTIAL/PLATELET
Basophils Absolute: 0.1 10*3/uL (ref 0.0–0.2)
Basos: 1 %
EOS (ABSOLUTE): 0.1 10*3/uL (ref 0.0–0.4)
Eos: 1 %
Hematocrit: 45.4 % (ref 37.5–51.0)
Hemoglobin: 15.6 g/dL (ref 13.0–17.7)
Immature Grans (Abs): 0.1 10*3/uL (ref 0.0–0.1)
Immature Granulocytes: 1 %
Lymphocytes Absolute: 2 10*3/uL (ref 0.7–3.1)
Lymphs: 22 %
MCH: 34 pg — ABNORMAL HIGH (ref 26.6–33.0)
MCHC: 34.4 g/dL (ref 31.5–35.7)
MCV: 99 fL — ABNORMAL HIGH (ref 79–97)
Monocytes Absolute: 1.2 10*3/uL — ABNORMAL HIGH (ref 0.1–0.9)
Monocytes: 13 %
Neutrophils Absolute: 5.5 10*3/uL (ref 1.4–7.0)
Neutrophils: 62 %
Platelets: 226 10*3/uL (ref 150–450)
RBC: 4.59 x10E6/uL (ref 4.14–5.80)
RDW: 12.4 % (ref 11.6–15.4)
WBC: 8.8 10*3/uL (ref 3.4–10.8)

## 2021-06-27 LAB — LIPID PANEL
Chol/HDL Ratio: 2.1 ratio (ref 0.0–5.0)
Cholesterol, Total: 159 mg/dL (ref 100–199)
HDL: 76 mg/dL (ref >39)
LDL Chol Calc (NIH): 71 mg/dL (ref 0–99)
Triglycerides: 56 mg/dL (ref 0–149)
VLDL Cholesterol Cal: 12 mg/dL (ref 5–40)

## 2021-06-27 LAB — VITAMIN D 25 HYDROXY (VIT D DEFICIENCY, FRACTURES): Vit D, 25-Hydroxy: 13.8 ng/mL — ABNORMAL LOW (ref 30.0–100.0)

## 2021-06-27 LAB — TSH+FREE T4
Free T4: 1.19 ng/dL (ref 0.82–1.77)
TSH: 1.28 u[IU]/mL (ref 0.450–4.500)

## 2021-06-27 NOTE — Progress Notes (Signed)
Pt needs an addon labs for H40, folic acid, ferritin

## 2021-06-27 NOTE — Telephone Encounter (Signed)
-----   Message from [name], MD sent at 06/27/2021  7:14 AM EST ----- Pt needs an addon labs for H91, folic acid, ferritin

## 2021-06-27 NOTE — Telephone Encounter (Signed)
Called [facility] and added on labs for F41, Folic Acid, and Ferritin

## 2021-06-27 NOTE — Progress Notes (Signed)
Statin and ASA therapy

## 2021-07-03 ENCOUNTER — Other Ambulatory Visit: Payer: Self-pay

## 2021-07-03 ENCOUNTER — Ambulatory Visit: Payer: Medicaid Other | Admitting: Nurse Practitioner

## 2021-07-03 ENCOUNTER — Encounter: Payer: Self-pay | Admitting: Nurse Practitioner

## 2021-07-03 ENCOUNTER — Ambulatory Visit: Payer: Medicaid Other | Admitting: Dermatology

## 2021-07-03 VITALS — BP 140/90 | HR 84 | Temp 98.3°F | Resp 16 | Ht 68.0 in | Wt 149.8 lb

## 2021-07-03 DIAGNOSIS — Z1283 Encounter for screening for malignant neoplasm of skin: Secondary | ICD-10-CM

## 2021-07-03 DIAGNOSIS — B079 Viral wart, unspecified: Secondary | ICD-10-CM | POA: Diagnosis not present

## 2021-07-03 DIAGNOSIS — D1800 Hemangioma unspecified site: Secondary | ICD-10-CM

## 2021-07-03 DIAGNOSIS — I10 Essential (primary) hypertension: Secondary | ICD-10-CM

## 2021-07-03 DIAGNOSIS — D229 Melanocytic nevi, unspecified: Secondary | ICD-10-CM

## 2021-07-03 DIAGNOSIS — H524 Presbyopia: Secondary | ICD-10-CM

## 2021-07-03 DIAGNOSIS — L821 Other seborrheic keratosis: Secondary | ICD-10-CM

## 2021-07-03 DIAGNOSIS — L578 Other skin changes due to chronic exposure to nonionizing radiation: Secondary | ICD-10-CM | POA: Diagnosis not present

## 2021-07-03 DIAGNOSIS — D485 Neoplasm of uncertain behavior of skin: Secondary | ICD-10-CM

## 2021-07-03 DIAGNOSIS — D225 Melanocytic nevi of trunk: Secondary | ICD-10-CM

## 2021-07-03 DIAGNOSIS — M5441 Lumbago with sciatica, right side: Secondary | ICD-10-CM

## 2021-07-03 DIAGNOSIS — I8393 Asymptomatic varicose veins of bilateral lower extremities: Secondary | ICD-10-CM

## 2021-07-03 DIAGNOSIS — L814 Other melanin hyperpigmentation: Secondary | ICD-10-CM

## 2021-07-03 DIAGNOSIS — Z86018 Personal history of other benign neoplasm: Secondary | ICD-10-CM
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
## 2021-07-03 NOTE — Patient Instructions (Addendum)
Wound Care Instructions  Cleanse wound gently with soap and water once a day then pat dry with clean gauze. Apply a thing coat of Petrolatum (petroleum jelly, "Vaseline") over the wound (unless you have an allergy to this). We recommend that you use a new, sterile tube of Vaseline. Do not pick or remove scabs. Do not remove the yellow or white "healing tissue" from the base of the wound.  Cover the wound with fresh, clean, nonstick gauze and secure with paper tape. You may use Band-Aids in place of gauze and tape if the would is small enough, but would recommend trimming much of the tape off as there is often too much. Sometimes Band-Aids can irritate the skin.  You should call the office for your biopsy report after 1 week if you have not already been contacted.  If you experience any problems, such as abnormal amounts of bleeding, swelling, significant bruising, significant pain, or evidence of infection, please call the office immediately.  FOR ADULT SURGERY PATIENTS: If you need something for pain relief you may take 1 extra strength Tylenol (acetaminophen) AND 2 Ibuprofen (200mg each) together every 4 hours as needed for pain. (do not take these if you are allergic to them or if you have a reason you should not take them.) Typically, you may only need pain medication for 1 to 3 days.   If you have any questions or concerns for your doctor, please call our main line at [phone] and press option 4 to reach your doctor's medical assistant. If no one answers, please leave a voicemail as directed and we will return your call as soon as possible. Messages left after 4 pm will be answered the following business day.   You may also send us a message via MyChart. We typically respond to MyChart messages within 1-2 business days.  For prescription refills, please ask your pharmacy to contact our office. Our fax number is [phone].  If you have an urgent issue when the clinic is closed that  cannot wait until the next business day, you can page your doctor at the number below.    Please note that while we do our best to be available for urgent issues outside of office hours, we are not available 24/7.   If you have an urgent issue and are unable to reach us, you may choose to seek medical care at your doctor's office, retail clinic, urgent care center, or emergency room.  If you have a medical emergency, please immediately call 911 or go to the emergency department.  Pager Numbers  - Dr. Kowalski: [phone]  - Dr. Moye: [phone]  - Dr. Stewart: [phone]  In the event of inclement weather, please call our main line at [phone] for an update on the status of any delays or closures.  Dermatology Medication Tips: Please keep the boxes that topical medications come in in order to help keep track of the instructions about where and how to use these. Pharmacies typically print the medication instructions only on the boxes and not directly on the medication tubes.   If your medication is too expensive, please contact our office at [phone] option 4 or send us a message through MyChart.   We are unable to tell what your co-pay for medications will be in advance as this is different depending on your insurance coverage. However, we may be able to find a substitute medication at lower cost or fill out paperwork to get insurance to cover a needed   medication.   If a prior authorization is required to get your medication covered by your insurance company, please allow us 1-2 business days to complete this process.  Drug prices often vary depending on where the prescription is filled and some pharmacies may offer cheaper prices.  The website www.goodrx.com contains coupons for medications through different pharmacies. The prices here do not account for what the cost may be with help from insurance (it may be cheaper with your insurance), but the website can give you the  price if you did not use any insurance.  - You can print the associated coupon and take it with your prescription to the pharmacy.  - You may also stop by our office during regular business hours and pick up a GoodRx coupon card.  - If you need your prescription sent electronically to a different pharmacy, notify our office through  MyChart or by phone at [phone] option 4.   

## 2021-07-03 NOTE — Progress Notes (Signed)
Follow-Up Visit   Subjective  Alex Bowers is a 65 y.o. male who presents for the following: Annual Exam (Hx dysplastic nevi - patient thinks he may have a wart on the bottom of his right foot that he would like checked today.). The patient presents for Total-Body Skin Exam (TBSE) for skin cancer screening and mole check.  The following portions of the chart were reviewed this encounter and updated as appropriate:   Tobacco  Allergies  Meds  Problems  Med Hx  Surg Hx  Fam Hx     Review of Systems:  No other skin or systemic complaints except as noted in HPI or Assessment and Plan.  Objective  Well appearing patient in no apparent distress; mood and affect are within normal limits.  A full examination was performed including scalp, head, eyes, ears, nose, lips, neck, chest, axillae, abdomen, back, buttocks, bilateral upper extremities, bilateral lower extremities, hands, feet, fingers, toes, fingernails, and toenails. All findings within normal limits unless otherwise noted below.  R plantar surface x 1 Verrucous papules -- Discussed viral etiology and contagion.   L lat buttocks 0.5 cm dark irregular brown macule.       Assessment & Plan  Viral warts, unspecified type R plantar surface x 1  Discussed viral etiology and risk of spread.  Discussed multiple treatments may be required to clear warts.  Discussed possible post-treatment dyspigmentation and risk of recurrence.  Destruction of lesion - R plantar surface x 1 Complexity: simple   Destruction method: cryotherapy   Informed consent: discussed and consent obtained   Timeout:  patient name, date of birth, surgical site, and procedure verified Lesion destroyed using liquid nitrogen: Yes   Region frozen until ice ball extended beyond lesion: Yes   Outcome: patient tolerated procedure well with no complications   Post-procedure details: wound care instructions given    Neoplasm of uncertain behavior of  skin L lat buttocks  Epidermal / dermal shaving  Lesion diameter (cm):  0.5 Informed consent: discussed and consent obtained   Timeout: patient name, date of birth, surgical site, and procedure verified   Procedure prep:  Patient was prepped and draped in usual sterile fashion Prep type:  Isopropyl alcohol Anesthesia: the lesion was anesthetized in a standard fashion   Anesthetic:  1% lidocaine w/ epinephrine 1-100,000 buffered w/ 8.4% NaHCO3 Instrument used: flexible razor blade   Hemostasis achieved with: pressure, aluminum chloride and electrodesiccation   Outcome: patient tolerated procedure well   Post-procedure details: sterile dressing applied and wound care instructions given   Dressing type: bandage and petrolatum    Specimen 1 - Surgical pathology Differential Diagnosis: D48.5 r/o dysplastic nevus Check Margins: No  Skin cancer screening  Lentigines - Scattered tan macules - Due to sun exposure - Benign-appearing, observe - Recommend daily broad spectrum sunscreen SPF 30+ to sun-exposed areas, reapply every 2 hours as needed. - Call for any changes  Seborrheic Keratoses - Stuck-on, waxy, tan-brown papules and/or plaques  - Benign-appearing - Discussed benign etiology and prognosis. - Observe - Call for any changes  Melanocytic Nevi - Tan-brown and/or pink-flesh-colored symmetric macules and papules - Benign appearing on exam today - Observation - Call clinic for new or changing moles - Recommend daily use of broad spectrum spf 30+ sunscreen to sun-exposed areas.   Hemangiomas - Red papules - Discussed benign nature - Observe - Call for any changes  Actinic Damage - Chronic condition, secondary to cumulative UV/sun exposure - diffuse scaly erythematous  macules with underlying dyspigmentation - Recommend daily broad spectrum sunscreen SPF 30+ to sun-exposed areas, reapply every 2 hours as needed.  - Staying in the shade or wearing long sleeves, sun  glasses (UVA+UVB protection) and wide brim hats (4-inch brim around the entire circumference of the hat) are also recommended for sun protection.  - Call for new or changing lesions.  History of Dysplastic Nevi - No evidence of recurrence today - Recommend regular full body skin exams - Recommend daily broad spectrum sunscreen SPF 30+ to sun-exposed areas, reapply every 2 hours as needed.  - Call if any new or changing lesions are noted between office visits  Varicose Veins/Spider Veins - Dilated blue, purple or red veins at the lower extremities - Reassured - Smaller vessels can be treated by sclerotherapy (a procedure to inject a medicine into the veins to make them disappear) if desired, but the treatment is not covered by insurance. Larger vessels may be covered if symptomatic and we would refer to vascular surgeon if treatment desired.  Skin cancer screening performed today.  Return in about 1 year (around 07/03/2022) for TBSE.  Luther Redo, CMA, am acting as scribe for Sarina Ser, MD . Documentation: I have reviewed the above documentation for accuracy and completeness, and I agree with the above.  Sarina Ser, MD

## 2021-07-04 ENCOUNTER — Telehealth: Payer: Self-pay

## 2021-07-04 NOTE — Telephone Encounter (Signed)
Ophthalmology referral sent to [facility] was denied due to not accepting patient's insurance. I spoke with Manus Gunning w/ [facility]. She verified they accept insurance and can treat for diagnosis. I left vm for patient to return my call so I can give him telephone # to schedule appointment-Toni

## 2021-07-05 LAB — FERRITIN: Ferritin: 191 ng/mL (ref 30–400)

## 2021-07-05 LAB — SPECIMEN STATUS REPORT

## 2021-07-05 LAB — B12 AND FOLATE PANEL
Folate: 3.9 ng/mL (ref >3.0)
Vitamin B-12: 417 pg/mL (ref 232–1245)

## 2021-07-09 ENCOUNTER — Encounter: Payer: Self-pay | Admitting: Dermatology

## 2021-07-12 ENCOUNTER — Telehealth: Payer: Self-pay

## 2021-07-12 NOTE — Telephone Encounter (Signed)
Patient advised of BX results.  °

## 2021-07-12 NOTE — Telephone Encounter (Signed)
Left message on voicemail to return my call.  

## 2021-07-12 NOTE — Telephone Encounter (Signed)
-----   Message from Ralene Bathe, MD sent at 07/05/2021  6:07 PM EST ----- Diagnosis Skin , left lat buttocks DYSPLASTIC COMPOUND NEVUS WITH MODERATE ATYPIA, LIMITED MARGINS FREE  Moderate dysplastic Recheck next visit

## 2021-07-18 NOTE — Telephone Encounter (Signed)
Lvm for patient to call [facility] @ [phone] to schedule his appointment-Toni

## 2021-07-21 ENCOUNTER — Telehealth: Payer: Medicaid Other | Admitting: Nurse Practitioner

## 2021-07-21 ENCOUNTER — Other Ambulatory Visit: Payer: Self-pay

## 2021-07-21 ENCOUNTER — Encounter: Payer: Self-pay | Admitting: Nurse Practitioner

## 2021-07-21 VITALS — Ht 68.0 in | Wt 149.0 lb

## 2021-07-21 DIAGNOSIS — U071 COVID-19: Secondary | ICD-10-CM | POA: Diagnosis not present

## 2021-07-21 DIAGNOSIS — J018 Other acute sinusitis: Secondary | ICD-10-CM

## 2021-07-21 NOTE — Progress Notes (Signed)
[facility] [place], [place] [address]  Internal MEDICINE  Telephone Visit  Patient Name: Alex Bowers  [id number]  [id number]  Date of Service: 07/21/2021  I connected with the patient at 12:16 PM by telephone and verified the patients identity using two identifiers.   I discussed the limitations, risks, security and privacy concerns of performing an evaluation and management service by telephone and the availability of in person appointments. I also discussed with the patient that there may be a patient responsible charge related to the service.  The patient expressed understanding and agrees to proceed.    Chief Complaint  Patient presents with   Telephone Assessment    Covid positive    Telephone Screen   Sinusitis    [id number]   Cough   Diarrhea    HPI Alex Bowers presents for a telehealth virtual visit for symptoms of sinusitis. He tested positive for covid. He reports cough and diarrhea. His symptoms started 1 week ago. He is using OTC mucinex.   Current Medication: Outpatient Encounter Medications as of 07/21/2021  Medication Sig   aspirin EC 81 MG tablet Take 81 mg by mouth daily.   buPROPion (WELLBUTRIN XL) 300 MG 24 hr tablet Take 1 tablet (300 mg total) by mouth daily.   citalopram (CELEXA) 40 MG tablet Take 20 mg by mouth daily.    finasteride (PROSCAR) 5 MG tablet Take 1 tablet (5 mg total) by mouth daily.   lisinopril (ZESTRIL) 20 MG tablet Take 1 tablet (20 mg total) by mouth daily.   meloxicam (MOBIC) 15 MG tablet Take 1 tablet (15 mg total) by mouth daily.   nicotine polacrilex (COMMIT) 4 MG lozenge SMARTSIG:1 Lozenge(s) By Mouth Every 6-8 Hours PRN   rosuvastatin (CRESTOR) 10 MG tablet Take 1 tablet (10 mg total) by mouth daily. For high cholesterol   tadalafil (CIALIS) 5 MG tablet Take 1 tablet (5 mg total) by mouth daily as needed for erectile dysfunction.   [DISCONTINUED] methocarbamol (ROBAXIN) 500 MG tablet TAKE 1  TABLET BY MOUTH 3 TIMES DAILY AS NEEDED FOR MUSCLE SPASMS.   No facility-administered encounter medications on file as of 07/21/2021.    Surgical History: Past Surgical History:  Procedure Laterality Date   COLONOSCOPY  2015   HERNIA REPAIR Right 06/09/14   inguinal    teeth removal       Medical History: Past Medical History:  Diagnosis Date   Dysplastic nevus 07/08/2014   Right proximal lateral deltoid. Sever atypia, lateral margin involved. Excised 09/07/2014, margins free.   Dysplastic nevus 11/16/2014   Right distal post. deltoid. Melanocytic hyperplasia and incidental dysplastic nevus with mild atypia, margins free. Excised.   Dysplastic nevus 07/03/2021   L lat buttocks - moderate   History of dysplastic nevus 05/18/2020   left anterior deltoid, severe / shave removal   Hyperlipidemia    Hypertension    Personal history of tobacco use, presenting hazards to health 12/29/2015   Stroke [facility]) 2014    Family History: Family History  Problem Relation Age of Onset   Cancer - Other Brother        phageal   Heart attack Father    Colon polyps Mother    Prostate cancer Neg Hx    Kidney cancer Neg Hx    Bladder Cancer Neg Hx     Social History   Socioeconomic History   Marital status: Divorced    Spouse name: Not on file   Number of children:  Not on file   Years of education: Not on file   Highest education level: Not on file  Occupational History   Not on file  Tobacco Use   Smoking status: Every Day    Packs/day: 2.00    Years: 37.00    Pack years: 74.00    Types: Cigarettes   Smokeless tobacco: Never  Substance and Sexual Activity   Alcohol use: Yes    Comment: occasionally   Drug use: No   Sexual activity: Not on file  Other Topics Concern   Not on file  Social History Narrative   Not on file   Social Determinants of Health   Financial Resource Strain: Not on file  Food Insecurity: Not on file  Transportation Needs: Not on file  Physical  Activity: Not on file  Stress: Not on file  Social Connections: Not on file  Intimate Partner Violence: Not on file      Review of Systems  Constitutional:  Positive for appetite change, chills, fatigue and fever.  HENT:  Positive for congestion, postnasal drip and rhinorrhea. Negative for ear pain, sinus pressure, sinus pain, sneezing, sore throat and trouble swallowing.   Respiratory:  Positive for cough, chest tightness, shortness of breath and wheezing.   Cardiovascular: Negative.  Negative for chest pain and palpitations.  Gastrointestinal:  Positive for diarrhea. Negative for abdominal pain, constipation and nausea.  Musculoskeletal:  Positive for myalgias.  Neurological:  Negative for dizziness, light-headedness and headaches.   Vital Signs: Ht 5\' 8"  (1.727 m)   Wt 149 lb (67.6 kg)   BMI 22.66 kg/m    Observation/Objective: He is alert and oriented and engages in conversation appropriately. He does not seem to be in any acute distress over video call.     Assessment/Plan: 1. COVID-19 virus infection Tested positive for covid, symptoms started 1 week ago, patient understands that he is outside of the window to receive paxlovid.   2. Acute non-recurrent sinusitis of other sinus Symptoms of sinusitis, tested positive for covid. With symptoms starting a week ago, encouraged OTC symptomatic treatment with tylenol and mucinex as needed or other desired cough syrup   General Counseling: Alex Bowers verbalizes understanding of the findings of today's phone visit and agrees with plan of treatment. I have discussed any further diagnostic evaluation that may be needed or ordered today. We also reviewed his medications today. he has been encouraged to call the office with any questions or concerns that should arise related to todays visit.  Return if symptoms worsen or fail to improve.   No orders of the defined types were placed in this encounter.   No orders of the defined  types were placed in this encounter.   Time spent:10 Minutes Time spent with patient included reviewing progress notes, labs, imaging studies, and discussing plan for follow up.  [place] Controlled Substance Database was reviewed by me for overdose risk score (ORS) if appropriate.  This patient was seen by Jonetta Osgood, FNP-C in collaboration with Dr. Clayborn Bigness as a part of collaborative care agreement.  Daysha Ashmore R. Valetta Fuller, MSN, FNP-C Internal medicine

## 2021-07-24 ENCOUNTER — Other Ambulatory Visit: Payer: Self-pay | Admitting: Nurse Practitioner

## 2021-07-24 DIAGNOSIS — M542 Cervicalgia: Secondary | ICD-10-CM

## 2021-09-25 ENCOUNTER — Encounter: Payer: Self-pay | Admitting: Nurse Practitioner

## 2021-09-25 ENCOUNTER — Ambulatory Visit: Payer: Medicaid Other | Admitting: Nurse Practitioner

## 2021-09-25 ENCOUNTER — Other Ambulatory Visit: Payer: Self-pay

## 2021-09-25 VITALS — BP 150/90 | HR 76 | Temp 98.8°F | Resp 16 | Ht 68.0 in | Wt 148.2 lb

## 2021-09-25 DIAGNOSIS — I10 Essential (primary) hypertension: Secondary | ICD-10-CM | POA: Diagnosis not present

## 2021-09-25 DIAGNOSIS — M5441 Lumbago with sciatica, right side: Secondary | ICD-10-CM | POA: Diagnosis not present

## 2021-09-25 DIAGNOSIS — I700 Atherosclerosis of aorta: Secondary | ICD-10-CM | POA: Diagnosis not present

## 2021-09-25 DIAGNOSIS — F331 Major depressive disorder, recurrent, moderate: Secondary | ICD-10-CM | POA: Diagnosis not present

## 2021-09-25 MED ORDER — ROSUVASTATIN CALCIUM 10 MG PO TABS: 10.0000 mg | ORAL_TABLET | Freq: Every day | ORAL | 1 refills | Status: DC

## 2021-09-25 MED ORDER — LISINOPRIL 40 MG PO TABS: 40.0000 mg | ORAL_TABLET | Freq: Every day | ORAL | 0 refills | Status: DC

## 2021-09-25 NOTE — Progress Notes (Signed)
[facility] [place], [place] [address]  Internal MEDICINE  Office Visit Note  Patient Name: Alex Bowers  [id number]  [id number]  Date of Service: 09/25/2021  Chief Complaint  Patient presents with   Follow-up   Hyperlipidemia   Hypertension   Medication Refill    Hyperlipidemia Pertinent negatives include no chest pain or shortness of breath.  Hypertension Pertinent negatives include no chest pain, neck pain, palpitations or shortness of breath.  Medication Refill Pertinent negatives include no abdominal pain, arthralgias, chest pain, chills, congestion, coughing, fatigue, joint swelling, nausea, neck pain, numbness, rash, sore throat or vomiting.  Jatorian presents for a follow up visit for hypertension, medication review and refills. He was previously being seen for back pain which has resolved. His blood pressure remains elevated even when rechecked, see vitals. Currently taking lisinopril 20 mg daily.he needs refills of rosuvastatin. He sees a psychiatrist now who prescribes his citalopram and bupropion.    Current Medication: Outpatient Encounter Medications as of 09/25/2021  Medication Sig   aspirin EC 81 MG tablet Take 81 mg by mouth daily.   buPROPion (WELLBUTRIN XL) 300 MG 24 hr tablet Take 1 tablet (300 mg total) by mouth daily.   citalopram (CELEXA) 40 MG tablet Take 20 mg by mouth daily.    finasteride (PROSCAR) 5 MG tablet Take 1 tablet (5 mg total) by mouth daily.   lisinopril (ZESTRIL) 40 MG tablet Take 1 tablet (40 mg total) by mouth daily.   meloxicam (MOBIC) 15 MG tablet Take 1 tablet (15 mg total) by mouth daily.   methocarbamol (ROBAXIN) 500 MG tablet TAKE 1 TABLET BY MOUTH THREE TIMES A DAY AS NEEDED FOR MUSCLE SPASMS   nicotine polacrilex (COMMIT) 4 MG lozenge SMARTSIG:1 Lozenge(s) By Mouth Every 6-8 Hours PRN   tadalafil (CIALIS) 5 MG tablet Take 1 tablet (5 mg total) by mouth daily as needed for erectile dysfunction.    [DISCONTINUED] lisinopril (ZESTRIL) 20 MG tablet Take 1 tablet (20 mg total) by mouth daily.   [DISCONTINUED] rosuvastatin (CRESTOR) 10 MG tablet Take 1 tablet (10 mg total) by mouth daily. For high cholesterol   rosuvastatin (CRESTOR) 10 MG tablet Take 1 tablet (10 mg total) by mouth daily. For high cholesterol   [DISCONTINUED] methocarbamol (ROBAXIN) 500 MG tablet TAKE 1 TABLET BY MOUTH 3 TIMES DAILY AS NEEDED FOR MUSCLE SPASMS.   No facility-administered encounter medications on file as of 09/25/2021.    Surgical History: Past Surgical History:  Procedure Laterality Date   COLONOSCOPY  2015   HERNIA REPAIR Right 06/09/14   inguinal    teeth removal       Medical History: Past Medical History:  Diagnosis Date   Dysplastic nevus 07/08/2014   Right proximal lateral deltoid. Sever atypia, lateral margin involved. Excised 09/07/2014, margins free.   Dysplastic nevus 11/16/2014   Right distal post. deltoid. Melanocytic hyperplasia and incidental dysplastic nevus with mild atypia, margins free. Excised.   Dysplastic nevus 07/03/2021   L lat buttocks - moderate   History of dysplastic nevus 05/18/2020   left anterior deltoid, severe / shave removal   Hyperlipidemia    Hypertension    Personal history of tobacco use, presenting hazards to health 12/29/2015   Stroke [facility]) 2014    Family History: Family History  Problem Relation Age of Onset   Cancer - Other Brother        phageal   Heart attack Father    Colon polyps Mother  Prostate cancer Neg Hx    Kidney cancer Neg Hx    Bladder Cancer Neg Hx     Social History   Socioeconomic History   Marital status: Divorced    Spouse name: Not on file   Number of children: Not on file   Years of education: Not on file   Highest education level: Not on file  Occupational History   Not on file  Tobacco Use   Smoking status: Every Day    Packs/day: 2.00    Years: 37.00    Pack years: 74.00    Types: Cigarettes   Smokeless  tobacco: Never  Substance and Sexual Activity   Alcohol use: Yes    Comment: occasionally   Drug use: No   Sexual activity: Not on file  Other Topics Concern   Not on file  Social History Narrative   Not on file   Social Determinants of Health   Financial Resource Strain: Not on file  Food Insecurity: Not on file  Transportation Needs: Not on file  Physical Activity: Not on file  Stress: Not on file  Social Connections: Not on file  Intimate Partner Violence: Not on file      Review of Systems  Constitutional:  Negative for chills, fatigue and unexpected weight change.  HENT:  Negative for congestion, rhinorrhea, sneezing and sore throat.   Eyes:  Negative for redness.  Respiratory:  Negative for cough, chest tightness and shortness of breath.   Cardiovascular:  Negative for chest pain and palpitations.  Gastrointestinal:  Negative for abdominal pain, constipation, diarrhea, nausea and vomiting.  Genitourinary:  Negative for dysuria and frequency.  Musculoskeletal:  Negative for arthralgias, back pain, joint swelling and neck pain.  Skin:  Negative for rash.  Neurological: Negative.  Negative for tremors and numbness.  Hematological:  Negative for adenopathy. Does not bruise/bleed easily.  Psychiatric/Behavioral:  Negative for behavioral problems (Depression), sleep disturbance and suicidal ideas. The patient is not nervous/anxious.    Vital Signs: BP (!) 166/98    Pulse 76    Temp 98.8 F (37.1 C)    Resp 16    Ht 5\' 8"  (1.727 m)    Wt 148 lb 3.2 oz (67.2 kg)    SpO2 97%    BMI 22.53 kg/m    Physical Exam Constitutional:      General: He is not in acute distress.    Appearance: Normal appearance. He is normal weight. He is not ill-appearing.  HENT:     Head: Normocephalic and atraumatic.  Eyes:     Pupils: Pupils are equal, round, and reactive to light.  Cardiovascular:     Rate and Rhythm: Normal rate and regular rhythm.  Pulmonary:     Effort: Pulmonary  effort is normal. No respiratory distress.  Neurological:     Mental Status: He is alert and oriented to person, place, and time.     Cranial Nerves: No cranial nerve deficit.     Coordination: Coordination normal.     Gait: Gait normal.  Psychiatric:        Mood and Affect: Mood normal.        Behavior: Behavior normal.       Assessment/Plan: 1. Essential hypertension Dose of lisinopril increased to 40 mg daily. May take 2 20mg -tablets until run out and then start 40 mg tablet 1 daily. Follow up in 1 month.  - lisinopril (ZESTRIL) 40 MG tablet; Take 1 tablet (40 mg total) by mouth daily.  Dispense: 90 tablet; Refill: 0  2. Atherosclerosis of aorta (HCC) Stable, refills ordered - rosuvastatin (CRESTOR) 10 MG tablet; Take 1 tablet (10 mg total) by mouth daily. For high cholesterol  Dispense: 90 tablet; Refill: 1  3. Acute right-sided low back pain with right-sided sciatica resolved  4. Moderate recurrent major depression ([place]) Sees psychiatry.     General Counseling: timber lucarelli understanding of the findings of todays visit and agrees with plan of treatment. I have discussed any further diagnostic evaluation that may be needed or ordered today. We also reviewed his medications today. he has been encouraged to call the office with any questions or concerns that should arise related to todays visit.    No orders of the defined types were placed in this encounter.   Meds ordered this encounter  Medications   rosuvastatin (CRESTOR) 10 MG tablet    Sig: Take 1 tablet (10 mg total) by mouth daily. For high cholesterol    Dispense:  90 tablet    Refill:  1   lisinopril (ZESTRIL) 40 MG tablet    Sig: Take 1 tablet (40 mg total) by mouth daily.    Dispense:  90 tablet    Refill:  0    Note increased dose, discontinue lisinopril 20 mg tablet. Please fill new prescription immediately.    Return in about 1 month (around 10/23/2021) for F/U, BP check, Jedidiah Demartini  PCP.   Total time spent:30 Minutes Time spent includes review of chart, medications, test results, and follow up plan with the patient.   [place] Controlled Substance Database was reviewed by me.  This patient was seen by Jonetta Osgood, FNP-C in collaboration with Dr. Clayborn Bigness as a part of collaborative care agreement.   Bernita Beckstrom R. Valetta Fuller, MSN, FNP-C Internal medicine

## 2021-10-02 ENCOUNTER — Other Ambulatory Visit: Payer: Self-pay | Admitting: Internal Medicine

## 2021-10-02 ENCOUNTER — Telehealth: Payer: Self-pay | Admitting: Acute Care

## 2021-10-02 DIAGNOSIS — F17210 Nicotine dependence, cigarettes, uncomplicated: Secondary | ICD-10-CM

## 2021-10-02 NOTE — Telephone Encounter (Signed)
Left message for pt to call schedule f/u lung screening CT scan.

## 2021-10-08 ENCOUNTER — Other Ambulatory Visit: Payer: Self-pay | Admitting: Nurse Practitioner

## 2021-10-08 DIAGNOSIS — M542 Cervicalgia: Secondary | ICD-10-CM

## 2021-10-23 ENCOUNTER — Ambulatory Visit: Payer: Medicaid Other | Admitting: Nurse Practitioner

## 2021-10-23 ENCOUNTER — Other Ambulatory Visit: Payer: Self-pay

## 2021-10-23 ENCOUNTER — Encounter: Payer: Self-pay | Admitting: Nurse Practitioner

## 2021-10-23 VITALS — BP 152/90 | HR 88 | Temp 98.5°F | Resp 16 | Ht 68.0 in | Wt 143.8 lb

## 2021-10-23 DIAGNOSIS — M5441 Lumbago with sciatica, right side: Secondary | ICD-10-CM | POA: Diagnosis not present

## 2021-10-23 DIAGNOSIS — F331 Major depressive disorder, recurrent, moderate: Secondary | ICD-10-CM

## 2021-10-23 DIAGNOSIS — I700 Atherosclerosis of aorta: Secondary | ICD-10-CM

## 2021-10-23 DIAGNOSIS — I10 Essential (primary) hypertension: Secondary | ICD-10-CM

## 2021-10-23 DIAGNOSIS — F4321 Adjustment disorder with depressed mood: Secondary | ICD-10-CM

## 2021-10-23 MED ORDER — LISINOPRIL 40 MG PO TABS: 40.0000 mg | ORAL_TABLET | Freq: Every day | ORAL | 0 refills | Status: DC

## 2021-10-23 NOTE — Progress Notes (Signed)
[facility] ?[address] ?[place], [place] [address] ? ?Internal MEDICINE  ?Office Visit Note ? ?Patient Name: Alex Bowers ? [id number]  ?[id number] ? ?Date of Service: 10/23/2021 ? ?Chief Complaint  ?Patient presents with  ? Follow-up  ? Hyperlipidemia  ? Hypertension  ? ? ?HPI ?Alex Bowers presents for follow-up visit for hypertension and hyperlipidemia.  He has been taking 20 mg daily of lisinopril and his blood pressure still not well controlled.  He continues to take rosuvastatin without any adverse side effects.  He continues to see psychiatry for depression and is taking citalopram.  In the past month his father passed away and so he is now dealing with for closing on his father's house and getting everything moved to his house in [place] from [place] where his father's house is.  He also turns 66 last month and is waiting to get Medicare. ? ? ?Current Medication: ?Outpatient Encounter Medications as of 10/23/2021  ?Medication Sig  ? aspirin EC 81 MG tablet Take 81 mg by mouth daily.  ? buPROPion (WELLBUTRIN XL) 300 MG 24 hr tablet TAKE 1 TABLET BY MOUTH EVERY DAY  ? citalopram (CELEXA) 40 MG tablet Take 20 mg by mouth daily.   ? finasteride (PROSCAR) 5 MG tablet Take 1 tablet (5 mg total) by mouth daily.  ? meloxicam (MOBIC) 15 MG tablet Take 1 tablet (15 mg total) by mouth daily.  ? methocarbamol (ROBAXIN) 500 MG tablet TAKE 1 TABLET BY MOUTH THREE TIMES A DAY AS NEEDED FOR MUSCLE SPASMS  ? nicotine polacrilex (COMMIT) 4 MG lozenge SMARTSIG:1 Lozenge(s) By Mouth Every 6-8 Hours PRN  ? rosuvastatin (CRESTOR) 10 MG tablet Take 1 tablet (10 mg total) by mouth daily. For high cholesterol  ? tadalafil (CIALIS) 5 MG tablet Take 1 tablet (5 mg total) by mouth daily as needed for erectile dysfunction.  ? [DISCONTINUED] lisinopril (ZESTRIL) 40 MG tablet Take 1 tablet (40 mg total) by mouth daily. (Patient taking differently: Take 20 mg by mouth daily.)  ? lisinopril (ZESTRIL) 40 MG tablet  Take 1 tablet (40 mg total) by mouth daily.  ? ?No facility-administered encounter medications on file as of 10/23/2021.  ? ? ?Surgical History: ?Past Surgical History:  ?Procedure Laterality Date  ? COLONOSCOPY  2015  ? HERNIA REPAIR Right 06/09/14  ? inguinal   ? teeth removal     ? ? ?Medical History: ?Past Medical History:  ?Diagnosis Date  ? Dysplastic nevus 07/08/2014  ? Right proximal lateral deltoid. Sever atypia, lateral margin involved. Excised 09/07/2014, margins free.  ? Dysplastic nevus 11/16/2014  ? Right distal post. deltoid. Melanocytic hyperplasia and incidental dysplastic nevus with mild atypia, margins free. Excised.  ? Dysplastic nevus 07/03/2021  ? L lat buttocks - moderate  ? History of dysplastic nevus 05/18/2020  ? left anterior deltoid, severe / shave removal  ? Hyperlipidemia   ? Hypertension   ? Personal history of tobacco use, presenting hazards to health 12/29/2015  ? Stroke [facility]) 2014  ? ? ?Family History: ?Family History  ?Problem Relation Age of Onset  ? Colon polyps Mother   ? Heart attack Father   ? Cancer - Other Brother   ?     phageal  ? Prostate cancer Neg Hx   ? Kidney cancer Neg Hx   ? Bladder Cancer Neg Hx   ? ? ?Social History  ? ?Socioeconomic History  ? Marital status: Divorced  ?  Spouse name: Not on file  ? Number of  children: Not on file  ? Years of education: Not on file  ? Highest education level: Not on file  ?Occupational History  ? Not on file  ?Tobacco Use  ? Smoking status: Every Day  ?  Packs/day: 2.00  ?  Years: 37.00  ?  Pack years: 74.00  ?  Types: Cigarettes  ? Smokeless tobacco: Never  ?Substance and Sexual Activity  ? Alcohol use: Yes  ?  Comment: occasionally  ? Drug use: No  ? Sexual activity: Not on file  ?Other Topics Concern  ? Not on file  ?Social History Narrative  ? Not on file  ? ?Social Determinants of Health  ? ?Financial Resource Strain: Not on file  ?Food Insecurity: Not on file  ?Transportation Needs: Not on file  ?Physical Activity: Not on file   ?Stress: Not on file  ?Social Connections: Not on file  ?Intimate Partner Violence: Not on file  ? ? ? ? ?Review of Systems  ?Constitutional:  Negative for chills, fatigue and unexpected weight change.  ?HENT:  Negative for congestion, rhinorrhea, sneezing and sore throat.   ?Eyes:  Negative for redness.  ?Respiratory:  Negative for cough, chest tightness and shortness of breath.   ?Cardiovascular:  Negative for chest pain and palpitations.  ?Gastrointestinal:  Negative for abdominal pain, constipation, diarrhea, nausea and vomiting.  ?Genitourinary:  Negative for dysuria and frequency.  ?Musculoskeletal:  Negative for arthralgias, back pain, joint swelling and neck pain.  ?Skin:  Negative for rash.  ?Neurological: Negative.  Negative for tremors and numbness.  ?Hematological:  Negative for adenopathy. Does not bruise/bleed easily.  ?Psychiatric/Behavioral:  Negative for behavioral problems (Depression), sleep disturbance and suicidal ideas. The patient is not nervous/anxious.   ? ?Vital Signs: ?BP (!) 152/90   Pulse 88   Temp 98.5 ?F (36.9 ?C)   Resp 16   Ht '5\' 8"'$  (1.727 m)   Wt 143 lb 12.8 oz (65.2 kg)   SpO2 98%   BMI 21.86 kg/m?  ? ? ?Physical Exam ?Vitals reviewed.  ?Constitutional:   ?   General: He is not in acute distress. ?   Appearance: Normal appearance. He is normal weight. He is not ill-appearing.  ?HENT:  ?   Head: Normocephalic and atraumatic.  ?Eyes:  ?   Pupils: Pupils are equal, round, and reactive to light.  ?Cardiovascular:  ?   Rate and Rhythm: Normal rate and regular rhythm.  ?Pulmonary:  ?   Effort: Pulmonary effort is normal. No respiratory distress.  ?Neurological:  ?   Mental Status: He is alert and oriented to person, place, and time.  ?Psychiatric:     ?   Mood and Affect: Mood normal.     ?   Behavior: Behavior normal.  ? ? ? ? ? ?Assessment/Plan: ?1. Essential hypertension ?Patient instructed to take 40 mg daily of lisinopril.  Patient has 10 mg and 20 mg tablets at home and was  instructed to take enough tablets to equal 40 mg(either 4 10 mg tablets or 2 20 mg tablets).  Follow-up in 2 months.  Patient will keep track of blood pressure and will call the clinic if not well controlled on 40 mg. ?- lisinopril (ZESTRIL) 40 MG tablet; Take 1 tablet (40 mg total) by mouth daily.  Dispense: 90 tablet; Refill: 0 ? ?2. Atherosclerosis of aorta ([place]) ?Continue rosuvastatin as prescribed ? ?3. Acute right-sided low back pain with right-sided sciatica ?Problem is resolved ? ?4. Moderate recurrent major depression ([place]) ?Stable,  patient continues to see psychiatry and is taking citalopram as prescribed ? ?5. Grieving ?Patient is grieving the loss of his father and is coping well. ? ? ?General Counseling: Alex Bowers understanding of the findings of todays visit and agrees with plan of treatment. I have discussed any further diagnostic evaluation that may be needed or ordered today. We also reviewed his medications today. he has been encouraged to call the office with any questions or concerns that should arise related to todays visit. ? ? ? ?No orders of the defined types were placed in this encounter. ? ? ?Meds ordered this encounter  ?Medications  ? lisinopril (ZESTRIL) 40 MG tablet  ?  Sig: Take 1 tablet (40 mg total) by mouth daily.  ?  Dispense:  90 tablet  ?  Refill:  0  ?  Patient will call when he needs to fill this prescription.  ? ? ?Return in about 2 months (around 12/23/2021) for F/U, BP check, Keshonda Monsour PCP. ? ? ?Total time spent:30 Minutes ?Time spent includes review of chart, medications, test results, and follow up plan with the patient.  ? ?[place] Controlled Substance Database was reviewed by me. ? ?This patient was seen by Jonetta Osgood, FNP-C in collaboration with Dr. Clayborn Bigness as a part of collaborative care agreement. ? ? ?Maurina Fawaz R. Valetta Fuller, MSN, FNP-C ?Internal medicine  ?

## 2021-12-25 ENCOUNTER — Encounter: Payer: Self-pay | Admitting: Nurse Practitioner

## 2021-12-25 ENCOUNTER — Ambulatory Visit: Payer: Medicaid Other | Admitting: Nurse Practitioner

## 2021-12-25 VITALS — BP 150/80 | HR 75 | Temp 98.5°F | Resp 16 | Ht 68.0 in | Wt 145.8 lb

## 2021-12-25 DIAGNOSIS — I10 Essential (primary) hypertension: Secondary | ICD-10-CM | POA: Diagnosis not present

## 2021-12-25 DIAGNOSIS — F331 Major depressive disorder, recurrent, moderate: Secondary | ICD-10-CM | POA: Diagnosis not present

## 2021-12-25 DIAGNOSIS — I700 Atherosclerosis of aorta: Secondary | ICD-10-CM | POA: Diagnosis not present

## 2021-12-25 DIAGNOSIS — F17210 Nicotine dependence, cigarettes, uncomplicated: Secondary | ICD-10-CM | POA: Diagnosis not present

## 2021-12-25 MED ORDER — AMLODIPINE BESYLATE 2.5 MG PO TABS: 2.5000 mg | ORAL_TABLET | Freq: Every day | ORAL | 1 refills | Status: DC

## 2021-12-25 MED ORDER — LISINOPRIL 40 MG PO TABS: 40.0000 mg | ORAL_TABLET | Freq: Every day | ORAL | 2 refills | Status: DC

## 2021-12-25 NOTE — Progress Notes (Unsigned)
[facility] ?[address] ?[place], [place] [address] ? ?Internal MEDICINE  ?Office Visit Note ? ?Patient Name: Alex Bowers ? [id number]  ?[id number] ? ?Date of Service: 12/25/2021 ? ?Chief Complaint  ?Patient presents with  ? Hypertension  ? ? ?Hypertension ?Pertinent negatives include no chest pain, neck pain, palpitations or shortness of breath.  ?Javione presents for a follow-up visit for ? ?Increased stress still paying for his fathers house and his house, trying to sell it.  ?150/80 ? ? ?Current Medication: ?Outpatient Encounter Medications as of 12/25/2021  ?Medication Sig  ? aspirin EC 81 MG tablet Take 81 mg by mouth daily.  ? buPROPion (WELLBUTRIN XL) 300 MG 24 hr tablet TAKE 1 TABLET BY MOUTH EVERY DAY  ? citalopram (CELEXA) 40 MG tablet Take 20 mg by mouth daily.   ? finasteride (PROSCAR) 5 MG tablet Take 1 tablet (5 mg total) by mouth daily.  ? lisinopril (ZESTRIL) 40 MG tablet Take 1 tablet (40 mg total) by mouth daily.  ? meloxicam (MOBIC) 15 MG tablet Take 1 tablet (15 mg total) by mouth daily.  ? methocarbamol (ROBAXIN) 500 MG tablet TAKE 1 TABLET BY MOUTH THREE TIMES A DAY AS NEEDED FOR MUSCLE SPASMS  ? nicotine polacrilex (COMMIT) 4 MG lozenge SMARTSIG:1 Lozenge(s) By Mouth Every 6-8 Hours PRN  ? rosuvastatin (CRESTOR) 10 MG tablet Take 1 tablet (10 mg total) by mouth daily. For high cholesterol  ? tadalafil (CIALIS) 5 MG tablet Take 1 tablet (5 mg total) by mouth daily as needed for erectile dysfunction.  ? ?No facility-administered encounter medications on file as of 12/25/2021.  ? ? ?Surgical History: ?Past Surgical History:  ?Procedure Laterality Date  ? COLONOSCOPY  2015  ? HERNIA REPAIR Right 06/09/14  ? inguinal   ? teeth removal     ? ? ?Medical History: ?Past Medical History:  ?Diagnosis Date  ? Dysplastic nevus 07/08/2014  ? Right proximal lateral deltoid. Sever atypia, lateral margin involved. Excised 09/07/2014, margins free.  ? Dysplastic nevus 11/16/2014  ? Right  distal post. deltoid. Melanocytic hyperplasia and incidental dysplastic nevus with mild atypia, margins free. Excised.  ? Dysplastic nevus 07/03/2021  ? L lat buttocks - moderate  ? History of dysplastic nevus 05/18/2020  ? left anterior deltoid, severe / shave removal  ? Hyperlipidemia   ? Hypertension   ? Personal history of tobacco use, presenting hazards to health 12/29/2015  ? Stroke [facility]) 2014  ? ? ?Family History: ?Family History  ?Problem Relation Age of Onset  ? Colon polyps Mother   ? Heart attack Father   ? Cancer - Other Brother   ?     phageal  ? Prostate cancer Neg Hx   ? Kidney cancer Neg Hx   ? Bladder Cancer Neg Hx   ? ? ?Social History  ? ?Socioeconomic History  ? Marital status: Divorced  ?  Spouse name: Not on file  ? Number of children: Not on file  ? Years of education: Not on file  ? Highest education level: Not on file  ?Occupational History  ? Not on file  ?Tobacco Use  ? Smoking status: Every Day  ?  Packs/day: 2.00  ?  Years: 37.00  ?  Pack years: 74.00  ?  Types: Cigarettes  ? Smokeless tobacco: Never  ?Substance and Sexual Activity  ? Alcohol use: Yes  ?  Comment: occasionally  ? Drug use: No  ? Sexual activity: Not on file  ?Other Topics Concern  ?  Not on file  ?Social History Narrative  ? Not on file  ? ?Social Determinants of Health  ? ?Financial Resource Strain: Not on file  ?Food Insecurity: Not on file  ?Transportation Needs: Not on file  ?Physical Activity: Not on file  ?Stress: Not on file  ?Social Connections: Not on file  ?Intimate Partner Violence: Not on file  ? ? ? ? ?Review of Systems  ?Constitutional:  Negative for chills, fatigue and unexpected weight change.  ?HENT:  Negative for congestion, rhinorrhea, sneezing and sore throat.   ?Eyes:  Negative for redness.  ?Respiratory:  Negative for cough, chest tightness and shortness of breath.   ?Cardiovascular:  Negative for chest pain and palpitations.  ?Gastrointestinal:  Negative for abdominal pain, constipation, diarrhea,  nausea and vomiting.  ?Genitourinary:  Negative for dysuria and frequency.  ?Musculoskeletal:  Negative for arthralgias, back pain, joint swelling and neck pain.  ?Skin:  Negative for rash.  ?Neurological: Negative.  Negative for tremors and numbness.  ?Hematological:  Negative for adenopathy. Does not bruise/bleed easily.  ?Psychiatric/Behavioral:  Negative for behavioral problems (Depression), sleep disturbance and suicidal ideas. The patient is not nervous/anxious.   ? ?Vital Signs: ?BP (!) 156/96   Pulse 75   Temp 98.5 ?F (36.9 ?C)   Resp 16   Ht '5\' 8"'$  (1.727 m)   Wt 145 lb 12.8 oz (66.1 kg)   SpO2 98%   BMI 22.17 kg/m?  ? ? ?Physical Exam ?Vitals reviewed.  ?Constitutional:   ?   General: He is not in acute distress. ?   Appearance: Normal appearance. He is normal weight. He is not ill-appearing.  ?HENT:  ?   Head: Normocephalic and atraumatic.  ?Eyes:  ?   Pupils: Pupils are equal, round, and reactive to light.  ?Cardiovascular:  ?   Rate and Rhythm: Normal rate and regular rhythm.  ?Pulmonary:  ?   Effort: Pulmonary effort is normal. No respiratory distress.  ?Neurological:  ?   Mental Status: He is alert and oriented to person, place, and time.  ?Psychiatric:     ?   Mood and Affect: Mood normal.     ?   Behavior: Behavior normal.  ? ? ? ? ? ?Assessment/Plan: ? ? ?General Counseling: arihaan bellucci understanding of the findings of todays visit and agrees with plan of treatment. I have discussed any further diagnostic evaluation that may be needed or ordered today. We also reviewed his medications today. he has been encouraged to call the office with any questions or concerns that should arise related to todays visit. ? ? ? ?No orders of the defined types were placed in this encounter. ? ? ?No orders of the defined types were placed in this encounter. ? ? ?Return for follow up on 6/29 or 7/5 whatever time is convenient for the patient. thnx, BP check, eval new med. ? ? ?Total time spent:***  Minutes ?Time spent includes review of chart, medications, test results, and follow up plan with the patient.  ? ?[place] Controlled Substance Database was reviewed by me. ? ?This patient was seen by Jonetta Osgood, FNP-C in collaboration with Dr. Clayborn Bigness as a part of collaborative care agreement. ? ? ?Floreine Kingdon R. Valetta Fuller, MSN, FNP-C ?Internal medicine  ?

## 2022-02-04 ENCOUNTER — Other Ambulatory Visit: Payer: Self-pay | Admitting: Nurse Practitioner

## 2022-02-04 DIAGNOSIS — G8929 Other chronic pain: Secondary | ICD-10-CM

## 2022-02-13 ENCOUNTER — Encounter: Payer: Self-pay | Admitting: Nurse Practitioner

## 2022-02-14 ENCOUNTER — Other Ambulatory Visit: Payer: Self-pay | Admitting: *Deleted

## 2022-02-14 DIAGNOSIS — N138 Other obstructive and reflux uropathy: Secondary | ICD-10-CM

## 2022-02-15 ENCOUNTER — Ambulatory Visit: Payer: Medicaid Other | Admitting: Nurse Practitioner

## 2022-02-15 ENCOUNTER — Other Ambulatory Visit: Payer: Medicaid Other

## 2022-02-15 ENCOUNTER — Encounter: Payer: Self-pay | Admitting: Nurse Practitioner

## 2022-02-15 VITALS — BP 145/90 | HR 85 | Temp 97.7°F | Resp 16 | Ht 68.0 in | Wt 148.0 lb

## 2022-02-15 DIAGNOSIS — I10 Essential (primary) hypertension: Secondary | ICD-10-CM

## 2022-02-15 DIAGNOSIS — Z658 Other specified problems related to psychosocial circumstances: Secondary | ICD-10-CM

## 2022-02-15 DIAGNOSIS — N401 Enlarged prostate with lower urinary tract symptoms: Secondary | ICD-10-CM

## 2022-02-15 DIAGNOSIS — F17210 Nicotine dependence, cigarettes, uncomplicated: Secondary | ICD-10-CM | POA: Diagnosis not present

## 2022-02-15 MED ORDER — AMLODIPINE BESYLATE 5 MG PO TABS: 5.0000 mg | ORAL_TABLET | Freq: Every day | ORAL | 1 refills | Status: DC

## 2022-02-15 NOTE — Progress Notes (Signed)
[facility] [place], [place] [address]  Internal MEDICINE  Office Visit Note  Patient Name: Alex Bowers  [id number]  [id number]  Date of Service: 02/15/2022  Chief Complaint  Patient presents with   Follow-up   Hyperlipidemia   Hypertension   Night Sweats    HPI Alex Bowers presents for a follow up visit for hypertension, hyperlipidemia and night sweats.  He is going through a period of high stress. He has an irresponsible tenant who does not pay the utlities and is trashing the upstairs apartment in his late father's house. He has a person interested in buying the house and cannot get the tenant out. And he is paying bills for his father's house and his own house and having to travel between [place] and [place].  He is working on becoming a nonsmoker. He has cut down to 1 ppd from 2 ppd and is vaping some.    Current Medication: Outpatient Encounter Medications as of 02/15/2022  Medication Sig   amLODipine (NORVASC) 5 MG tablet Take 1 tablet (5 mg total) by mouth daily.   aspirin EC 81 MG tablet Take 81 mg by mouth daily.   citalopram (CELEXA) 40 MG tablet Take 20 mg by mouth daily.    lisinopril (ZESTRIL) 40 MG tablet Take 1 tablet (40 mg total) by mouth daily.   meloxicam (MOBIC) 15 MG tablet TAKE 1 TABLET (15 MG TOTAL) BY MOUTH DAILY.   methocarbamol (ROBAXIN) 500 MG tablet TAKE 1 TABLET BY MOUTH THREE TIMES A DAY AS NEEDED FOR MUSCLE SPASMS   nicotine polacrilex (COMMIT) 4 MG lozenge SMARTSIG:1 Lozenge(s) By Mouth Every 6-8 Hours PRN   [DISCONTINUED] amLODipine (NORVASC) 2.5 MG tablet Take 1 tablet (2.5 mg total) by mouth daily.   [DISCONTINUED] buPROPion (WELLBUTRIN XL) 300 MG 24 hr tablet TAKE 1 TABLET BY MOUTH EVERY DAY   [DISCONTINUED] finasteride (PROSCAR) 5 MG tablet Take 1 tablet (5 mg total) by mouth daily.   [DISCONTINUED] rosuvastatin (CRESTOR) 10 MG tablet Take 1 tablet (10 mg total) by mouth daily. For high cholesterol    [DISCONTINUED] tadalafil (CIALIS) 5 MG tablet Take 1 tablet (5 mg total) by mouth daily as needed for erectile dysfunction.   No facility-administered encounter medications on file as of 02/15/2022.    Surgical History: Past Surgical History:  Procedure Laterality Date   COLONOSCOPY  2015   HERNIA REPAIR Right 06/09/14   inguinal    teeth removal       Medical History: Past Medical History:  Diagnosis Date   Dysplastic nevus 07/08/2014   Right proximal lateral deltoid. Sever atypia, lateral margin involved. Excised 09/07/2014, margins free.   Dysplastic nevus 11/16/2014   Right distal post. deltoid. Melanocytic hyperplasia and incidental dysplastic nevus with mild atypia, margins free. Excised.   Dysplastic nevus 07/03/2021   L lat buttocks - moderate   History of dysplastic nevus 05/18/2020   left anterior deltoid, severe / shave removal   Hyperlipidemia    Hypertension    Personal history of tobacco use, presenting hazards to health 12/29/2015   Stroke [facility]) 2014    Family History: Family History  Problem Relation Age of Onset   Colon polyps Mother    Heart attack Father    Cancer - Other Brother        phageal   Prostate cancer Neg Hx    Kidney cancer Neg Hx    Bladder Cancer Neg Hx     Social History   Socioeconomic  History   Marital status: Divorced    Spouse name: Not on file   Number of children: Not on file   Years of education: Not on file   Highest education level: Not on file  Occupational History   Not on file  Tobacco Use   Smoking status: Every Day    Packs/day: 2.00    Years: 37.00    Total pack years: 74.00    Types: Cigarettes   Smokeless tobacco: Never   Tobacco comments:    2 packs daily, trying to cut down to 1 pack/vaping  Vaping Use   Vaping Use: Every day  Substance and Sexual Activity   Alcohol use: Yes    Comment: occasionally   Drug use: No   Sexual activity: Not on file  Other Topics Concern   Not on file  Social  History Narrative   Not on file   Social Determinants of Health   Financial Resource Strain: Not on file  Food Insecurity: Not on file  Transportation Needs: Not on file  Physical Activity: Not on file  Stress: Not on file  Social Connections: Not on file  Intimate Partner Violence: Not on file      Review of Systems  Constitutional:  Negative for chills, fatigue and unexpected weight change.  HENT:  Negative for congestion, rhinorrhea, sneezing and sore throat.   Eyes:  Negative for redness.  Respiratory:  Negative for cough, chest tightness and shortness of breath.   Cardiovascular:  Negative for chest pain and palpitations.  Gastrointestinal:  Negative for abdominal pain, constipation, diarrhea, nausea and vomiting.  Genitourinary:  Negative for dysuria and frequency.  Musculoskeletal:  Negative for arthralgias, back pain, joint swelling and neck pain.  Skin:  Negative for rash.  Neurological: Negative.  Negative for tremors and numbness.  Hematological:  Negative for adenopathy. Does not bruise/bleed easily.  Psychiatric/Behavioral:  Negative for behavioral problems (Depression), sleep disturbance and suicidal ideas. The patient is not nervous/anxious.     Vital Signs: BP (!) 145/90 (BP Location: Left Arm, Patient Position: Sitting, Cuff Size: Normal) Comment: 151/96  Pulse 85   Temp 97.7 F (36.5 C)   Resp 16   Ht '5\' 8"'$  (1.727 m)   Wt 148 lb (67.1 kg)   SpO2 98%   BMI 22.50 kg/m    Physical Exam Vitals reviewed.  Constitutional:      General: He is not in acute distress.    Appearance: Normal appearance. He is normal weight. He is not ill-appearing.  HENT:     Head: Normocephalic and atraumatic.  Eyes:     Pupils: Pupils are equal, round, and reactive to light.  Cardiovascular:     Rate and Rhythm: Normal rate and regular rhythm.  Pulmonary:     Effort: Pulmonary effort is normal. No respiratory distress.  Neurological:     Mental Status: He is alert and  oriented to person, place, and time.  Psychiatric:        Mood and Affect: Mood normal.        Behavior: Behavior normal.        Assessment/Plan: 1. Essential hypertension Blood pressure still not optimally controlled, amlodipine increased to 5 mg daily. Follow up in 6 weeks when patient is back in town from his father's house in [place].  - amLODipine (NORVASC) 5 MG tablet; Take 1 tablet (5 mg total) by mouth daily.  Dispense: 90 tablet; Refill: 1  2. Cigarette smoker Patient praised for significant progress  toward smoking cessation, decreased from 2 down to 1 ppd. Encouraged to continue and call the clinic if he needs assistance with smoking cessation.   3. Psychosocial stressors Significant stressors in life right now-- financial difficulties due to paying for 2 houses and utilities and a tenant who is not paying his part. He is also stressed with all the driving back and forth between cities.    General Counseling: courtez twaddle understanding of the findings of todays visit and agrees with plan of treatment. I have discussed any further diagnostic evaluation that may be needed or ordered today. We also reviewed his medications today. he has been encouraged to call the office with any questions or concerns that should arise related to todays visit.    No orders of the defined types were placed in this encounter.   Meds ordered this encounter  Medications   amLODipine (NORVASC) 5 MG tablet    Sig: Take 1 tablet (5 mg total) by mouth daily.    Dispense:  90 tablet    Refill:  1    Return in about 6 weeks (around 03/29/2022) for F/U, BP check, Farid Grigorian PCP.   Total time spent:30 Minutes Time spent includes review of chart, medications, test results, and follow up plan with the patient.   [place] Controlled Substance Database was reviewed by me.  This patient was seen by Jonetta Osgood, FNP-C in collaboration with Dr. Clayborn Bigness as a part of collaborative care  agreement.   Lindee Leason R. Valetta Fuller, MSN, FNP-C Internal medicine

## 2022-02-16 LAB — PSA: Prostate Specific Ag, Serum: 0.6 ng/mL (ref 0.0–4.0)

## 2022-02-20 NOTE — Progress Notes (Unsigned)
08/08/2018 6:59 AM   [name] 02-Mar-1956 [id number]  Referring provider: [name], [place],  [place] [address]  Urological history: 1. High risk hematuria -smoker -CTU NED 2017 -cysto impressive intraluminal bilobar hypertrophy 2017 -no reports of gross hematuria -UA ***  2. BPH with LU TS -PSA, 01/2022 - 0.6 -cysto impressive intraluminal bilobar hypertrophy 2017 -I PSS *** -PVR *** -managed with tadalafil 5 mg daily and finasteride 5 mg daily   3. Urgency -contributing factors of age, BPH, BP meds, smoking,  -failed OAB meds -deemed a poor candidate for Interstim and Botox -insurance would not cover PTNS  4. ED -contributing factors of age, BPH, HTN and smoking  -SHIM *** -tadalafil 5 mg daily    No chief complaint on file.    HPI: Alex Bowers is a 66 y.o. male who presents for yearly follow up.         Score:  1-7 Mild 8-19 Moderate 20-35 Severe         Score: 1-7 Severe ED 8-11 Moderate ED 12-16 Mild-Moderate ED 17-21 Mild ED 22-25 No ED   PMH: Past Medical History:  Diagnosis Date   Dysplastic nevus 07/08/2014   Right proximal lateral deltoid. Sever atypia, lateral margin involved. Excised 09/07/2014, margins free.   Dysplastic nevus 11/16/2014   Right distal post. deltoid. Melanocytic hyperplasia and incidental dysplastic nevus with mild atypia, margins free. Excised.   Dysplastic nevus 07/03/2021   L lat buttocks - moderate   History of dysplastic nevus 05/18/2020   left anterior deltoid, severe / shave removal   Hyperlipidemia    Hypertension    Personal history of tobacco use, presenting hazards to health 12/29/2015   Stroke [facility]) 2014    Surgical History: Past Surgical History:  Procedure Laterality Date   COLONOSCOPY  2015   HERNIA REPAIR Right 06/09/14   inguinal    teeth removal       Home Medications:  Allergies as of 02/21/2022   No Known Allergies       Medication List        Accurate as of February 20, 2022  6:59 AM. If you have any questions, ask your nurse or doctor.          amLODipine 5 MG tablet Commonly known as: NORVASC Take 1 tablet (5 mg total) by mouth daily.   aspirin EC 81 MG tablet Take 81 mg by mouth daily.   buPROPion 300 MG 24 hr tablet Commonly known as: WELLBUTRIN XL TAKE 1 TABLET BY MOUTH EVERY DAY   citalopram 40 MG tablet Commonly known as: CELEXA Take 20 mg by mouth daily.   finasteride 5 MG tablet Commonly known as: PROSCAR Take 1 tablet (5 mg total) by mouth daily.   lisinopril 40 MG tablet Commonly known as: ZESTRIL Take 1 tablet (40 mg total) by mouth daily.   meloxicam 15 MG tablet Commonly known as: MOBIC TAKE 1 TABLET (15 MG TOTAL) BY MOUTH DAILY.   methocarbamol 500 MG tablet Commonly known as: ROBAXIN TAKE 1 TABLET BY MOUTH THREE TIMES A DAY AS NEEDED FOR MUSCLE SPASMS   nicotine polacrilex 4 MG lozenge Commonly known as: COMMIT SMARTSIG:1 Lozenge(s) By Mouth Every 6-8 Hours PRN   rosuvastatin 10 MG tablet Commonly known as: Crestor Take 1 tablet (10 mg total) by mouth daily. For high cholesterol   tadalafil 5 MG tablet Commonly known as: CIALIS Take 1 tablet (5 mg total) by mouth daily as  needed for erectile dysfunction.        Allergies: No Known Allergies  Family History: Family History  Problem Relation Age of Onset   Colon polyps Mother    Heart attack Father    Cancer - Other Brother        phageal   Prostate cancer Neg Hx    Kidney cancer Neg Hx    Bladder Cancer Neg Hx     Social History:  reports that he has been smoking cigarettes. He has a 74.00 pack-year smoking history. He has never used smokeless tobacco. He reports current alcohol use. He reports that he does not use drugs.  ROS: For pertinent review of systems please refer to history of present illness  Physical Exam: There were no vitals taken for this visit.  Constitutional:  Well nourished.  Alert and oriented, No acute distress. HEENT: [place] AT, moist mucus membranes.  Trachea midline Cardiovascular: No clubbing, cyanosis, or edema. Respiratory: Normal respiratory effort, no increased work of breathing. GU: No CVA tenderness.  No bladder fullness or masses.  Patient with circumcised/uncircumcised phallus. ***Foreskin easily retracted***  Urethral meatus is patent.  No penile discharge. No penile lesions or rashes. Scrotum without lesions, cysts, rashes and/or edema.  Testicles are located scrotally bilaterally. No masses are appreciated in the testicles. Left and right epididymis are normal. Rectal: Patient with  normal sphincter tone. Anus and perineum without scarring or rashes. No rectal masses are appreciated. Prostate is approximately *** grams, *** nodules are appreciated. Seminal vesicles are normal. Neurologic: Grossly intact, no focal deficits, moving all 4 extremities. Psychiatric: Normal mood and affect.   Laboratory Data: Results for orders placed or performed in visit on 02/15/22  PSA  Result Value Ref Range   Prostate Specific Ag, Serum 0.6 0.0 - 4.0 ng/mL       Latest Ref Rng & Units 06/26/2021    2:42 PM 04/11/2020    2:36 PM 04/09/2019    3:44 PM  CBC  WBC 3.4 - 10.8 x10E3/uL 8.8  7.3  9.6   Hemoglobin 13.0 - 17.7 g/dL 15.6  16.2  15.8   Hematocrit 37.5 - 51.0 % 45.4  48.6  45.8   Platelets 150 - 450 x10E3/uL 226  242  246        Latest Ref Rng & Units 06/26/2021    2:42 PM 04/11/2020    2:36 PM 04/09/2019    3:44 PM  CMP  Glucose 70 - 99 mg/dL 88  106  95   BUN 8 - 27 mg/dL '9  10  12   '$ Creatinine 0.76 - 1.27 mg/dL 1.05  0.84  0.85   Sodium 134 - 144 mmol/L 132  140  134   Potassium 3.5 - 5.2 mmol/L 4.8  4.6  4.6   Chloride 96 - 106 mmol/L 96  103  96   CO2 20 - 29 mmol/L '23  26  24   '$ Calcium 8.6 - 10.2 mg/dL 9.3  9.2  9.4   Total Protein 6.0 - 8.5 g/dL 6.6  6.4  7.0   Total Bilirubin 0.0 - 1.2 mg/dL 0.5  0.5  0.6   Alkaline Phos 44 - 121 IU/L 56  63   58   AST 0 - 40 IU/L '24  18  19   '$ ALT 0 - 44 IU/L '19  13  17      '$ Urinalysis  *** I have reviewed the labs.  Pertinent Imaging ***  Assessment & Plan:  1. Urgency -Failed Myrbetriq - ADR of diarrhea and failed Toviaz 8 mg daily - ADR of constipation -Discussed PT and PTNS - insurance will not cover  -Not a candidate for Botox or Interstim -symptoms are milder  2. BPH with LU TS -Urgency is most bothersome symptom - need to manage conservatively - see above  -Continue finasteride 5 mg daily and Cialis 5 mg daily - refills given  3. Erectile dysfunction  -Continue Cialis 5 mg daily  -increase to tadalafil 5 mg, up to 4 tablets for intercourse   4. History of hematuria -Hematuria work up completed in 08/2015 - findings positive for friable enlarged prostate - continue finasteride -No report of gross hematuria  -UA today is negative for AMH  No follow-ups on file.  These notes generated with voice recognition software. I apologize for typographical errors.  Zara Council, PA-C  [facility] [address] [place], [place] [address] [phone]

## 2022-02-21 ENCOUNTER — Ambulatory Visit: Payer: Medicare Other | Admitting: Urology

## 2022-02-21 ENCOUNTER — Encounter: Payer: Self-pay | Admitting: Urology

## 2022-02-21 VITALS — BP 149/87 | HR 80 | Ht 68.0 in | Wt 150.0 lb

## 2022-02-21 DIAGNOSIS — N401 Enlarged prostate with lower urinary tract symptoms: Secondary | ICD-10-CM | POA: Diagnosis not present

## 2022-02-21 DIAGNOSIS — N529 Male erectile dysfunction, unspecified: Secondary | ICD-10-CM

## 2022-02-21 DIAGNOSIS — R319 Hematuria, unspecified: Secondary | ICD-10-CM | POA: Diagnosis not present

## 2022-02-21 DIAGNOSIS — R3915 Urgency of urination: Secondary | ICD-10-CM

## 2022-02-21 DIAGNOSIS — N138 Other obstructive and reflux uropathy: Secondary | ICD-10-CM

## 2022-02-21 LAB — URINALYSIS, COMPLETE
Bilirubin, UA: NEGATIVE
Glucose, UA: NEGATIVE
Ketones, UA: NEGATIVE
Nitrite, UA: NEGATIVE
Protein,UA: NEGATIVE
RBC, UA: NEGATIVE
Specific Gravity, UA: 1.015 (ref 1.005–1.030)
Urobilinogen, Ur: 0.2 mg/dL (ref 0.2–1.0)
pH, UA: 7 (ref 5.0–7.5)

## 2022-02-21 LAB — MICROSCOPIC EXAMINATION

## 2022-02-21 LAB — BLADDER SCAN AMB NON-IMAGING: Scan Result: 84

## 2022-02-21 MED ORDER — FINASTERIDE 5 MG PO TABS: 5.0000 mg | ORAL_TABLET | Freq: Every day | ORAL | 3 refills | Status: DC

## 2022-02-21 MED ORDER — TADALAFIL 5 MG PO TABS: 5.0000 mg | ORAL_TABLET | Freq: Every day | ORAL | 3 refills | Status: DC | PRN

## 2022-02-27 IMAGING — CR DG HIP (WITH OR WITHOUT PELVIS) 2-3V*R*
1 series · 3 of 3 positions shown · non-contrast
Comparison: Concurrently obtained radiographs of the lumbar spine

CLINICAL DATA: Lower back pain radiating into the right hip

EXAM:
DG HIP (WITH OR WITHOUT PELVIS) 2-3V RIGHT

[Series 1: dg hip unilat w or w/o pelvis 2-3 views  · non-contrast · 0.14mm/px · 3 of 3 slices shown]
[im 1/3]
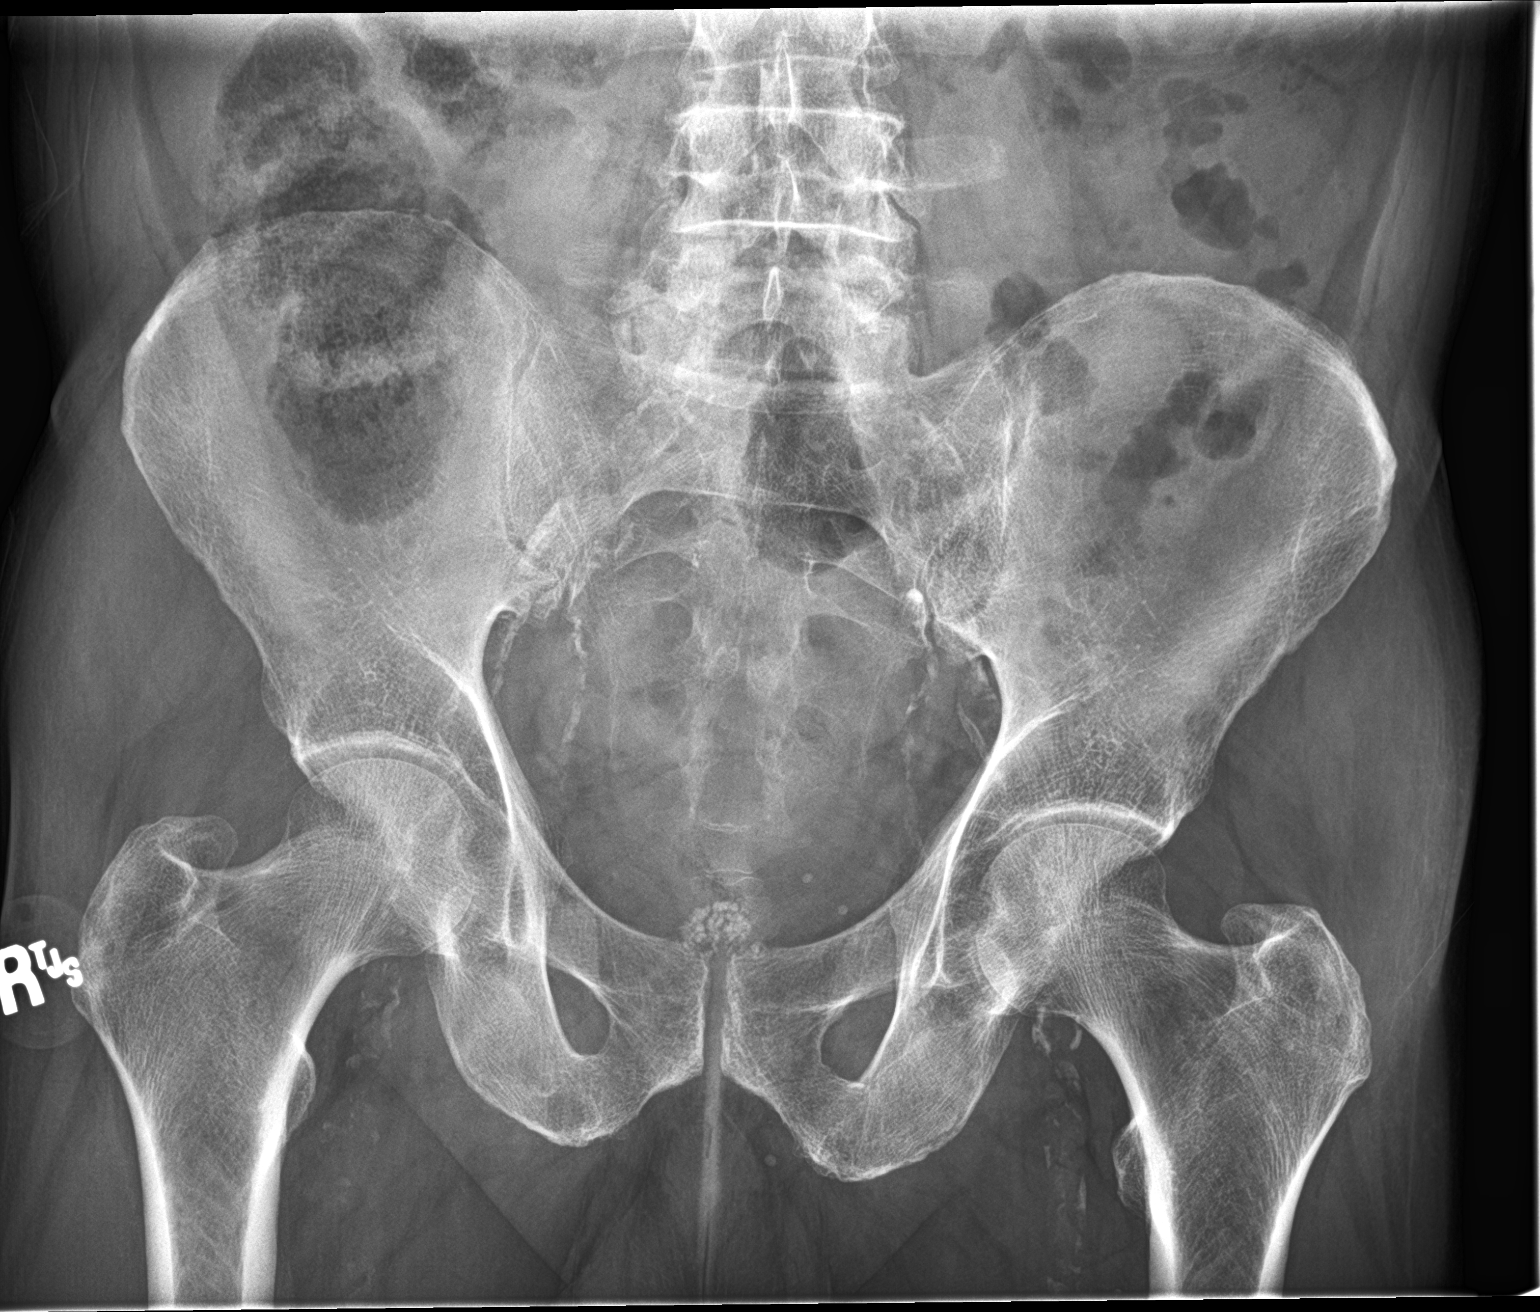
[im 2/3]
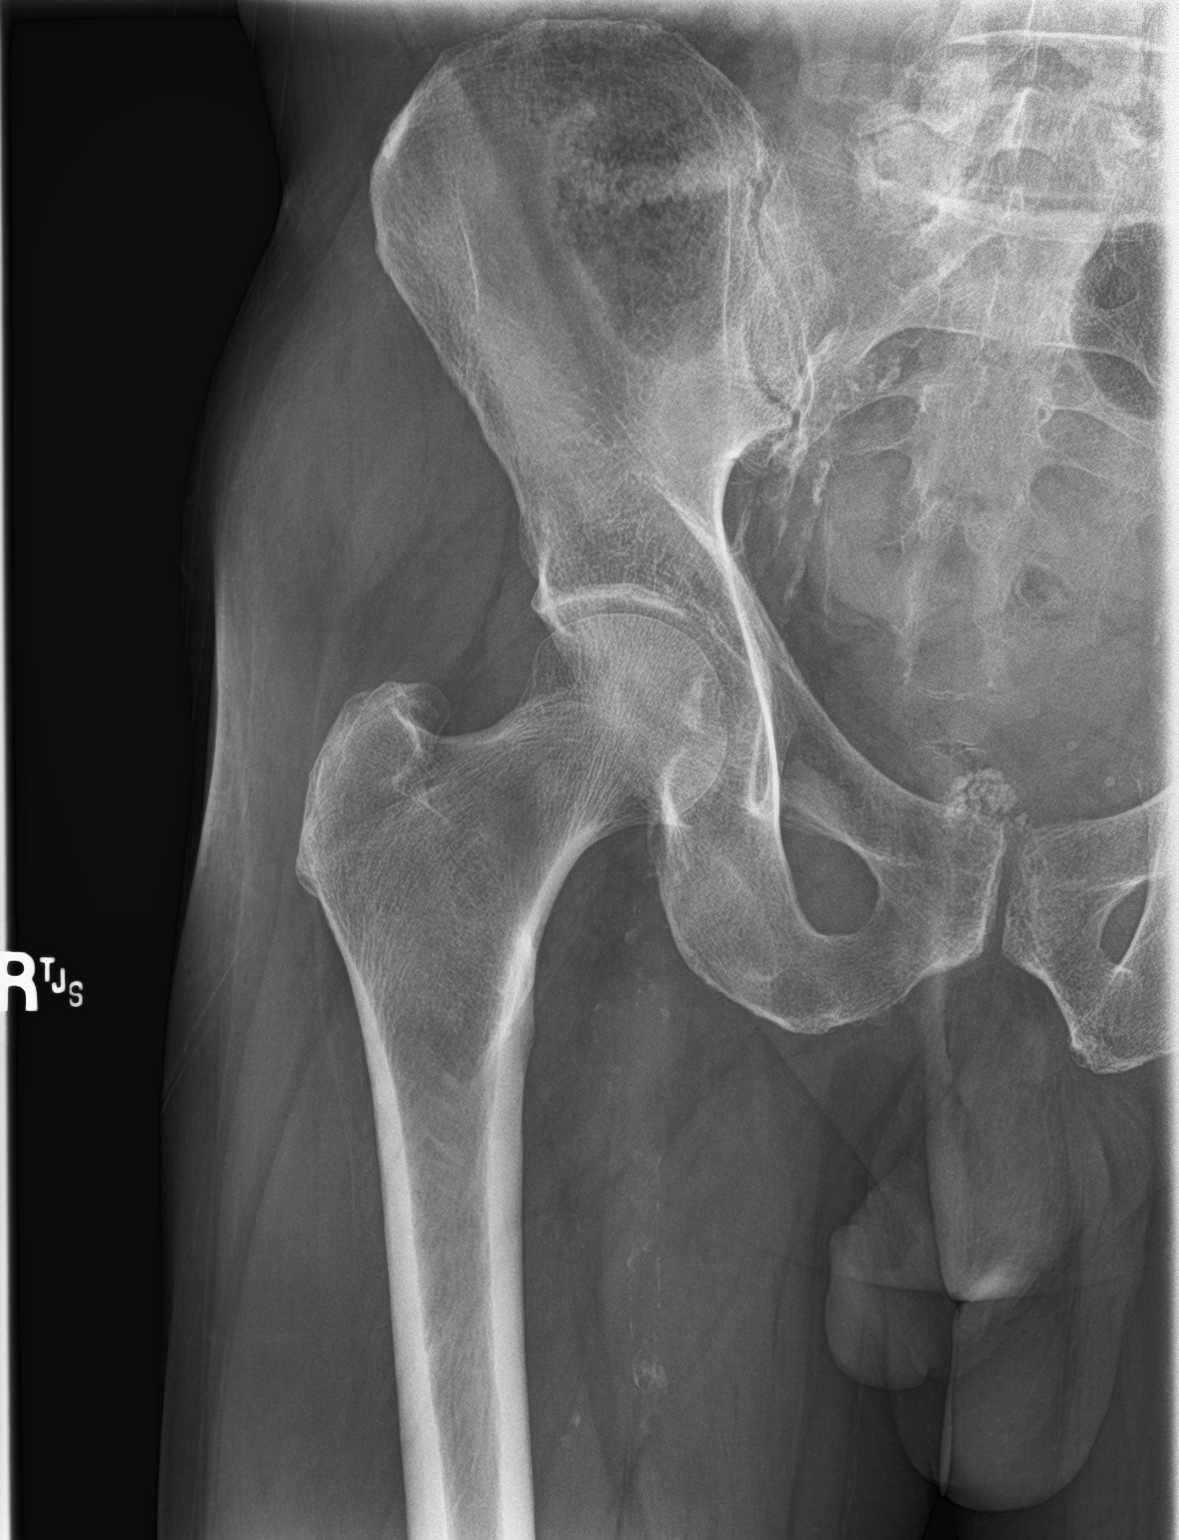
[im 3/3]
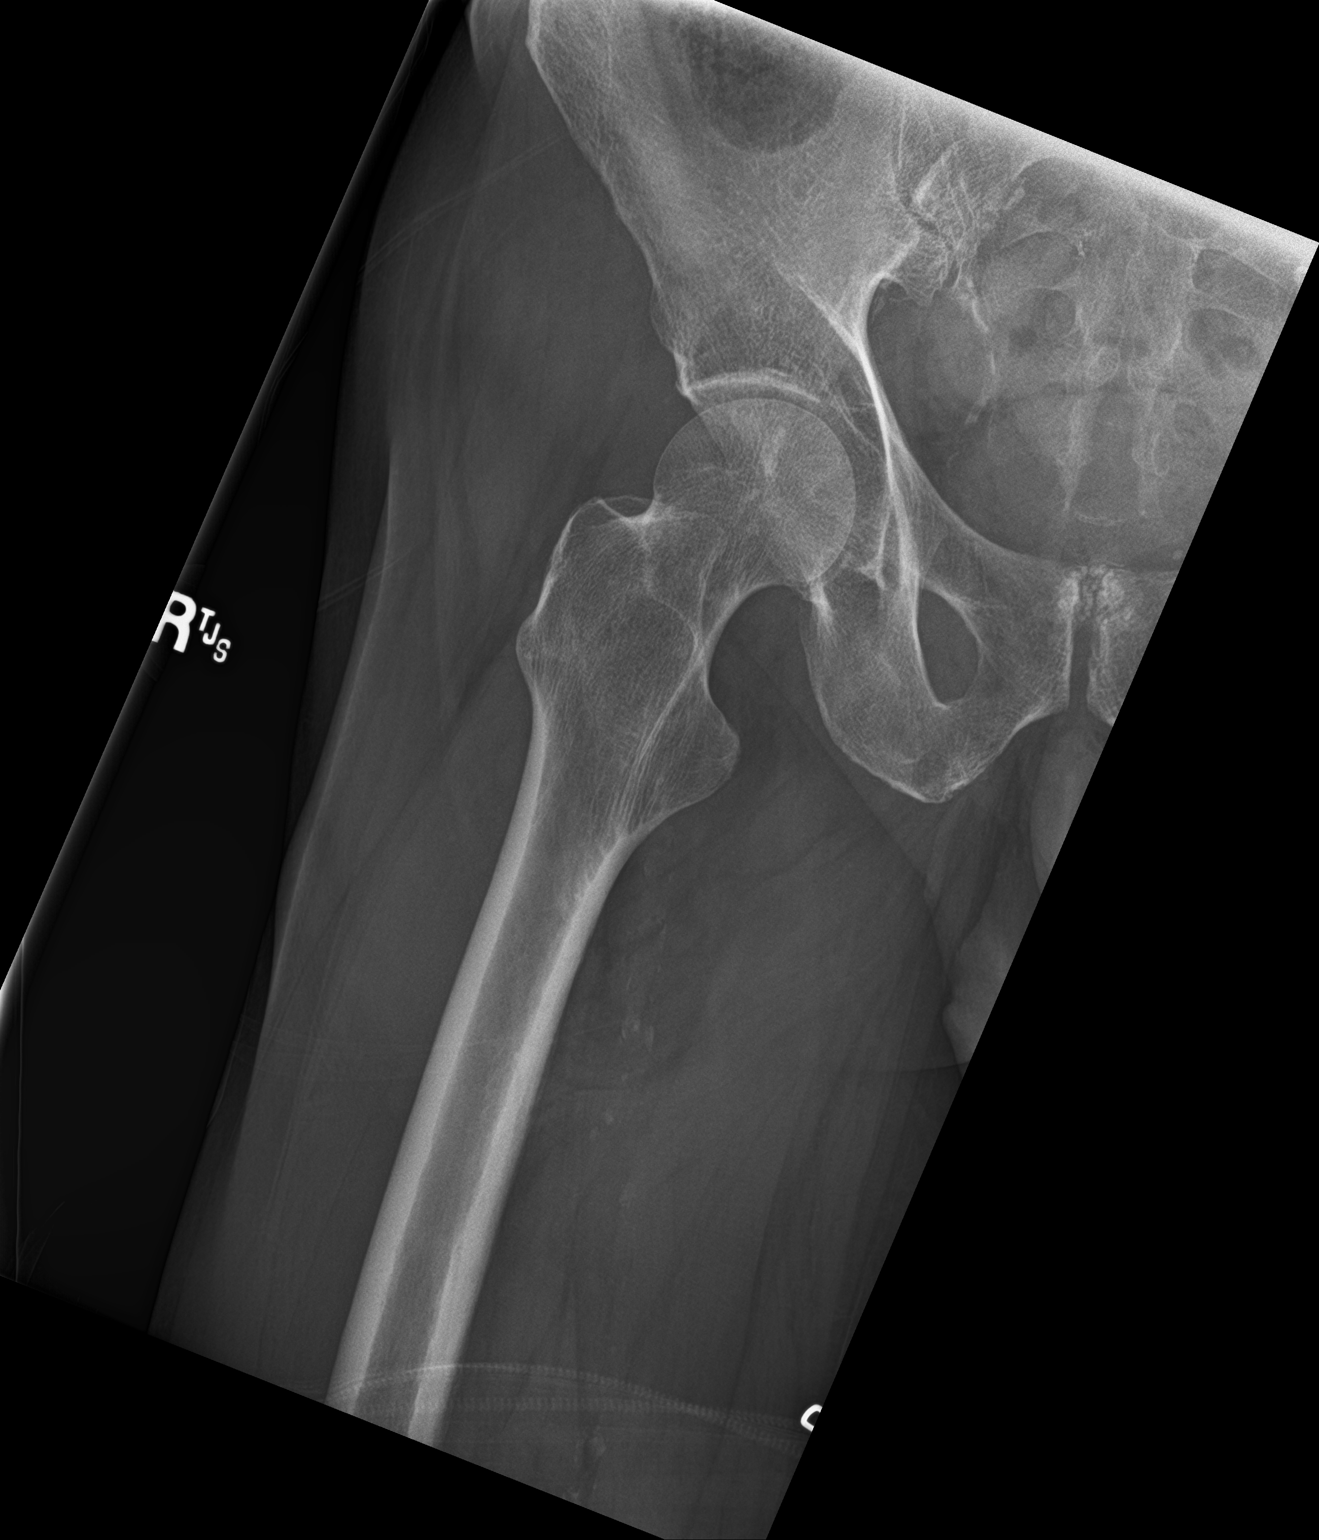

[3 of 3 positions shown; findings below may reference images not displayed]

FINDINGS: There is no evidence of hip fracture or dislocation. There is no
evidence of arthropathy or other focal bone abnormality. Aortoiliac
and bilateral femoral atherosclerotic calcifications again noted.
Calcifications also visible in the prostate gland.
IMPRESSION: 1. Normal appearance of the right hip.
2. Advanced aortoiliac and femoral artery atherosclerotic vascular
calcifications.

## 2022-02-27 IMAGING — CR DG LUMBAR SPINE COMPLETE 4+V
1 series · 5 of 5 positions shown · non-contrast
Comparison: None.

CLINICAL DATA: Low back pain with right sided sciatica

EXAM:
LUMBAR SPINE - COMPLETE 4+ VIEW

[Series 1: dg lumbar spine complete 4 +v · 0.14mm/px · 5 of 5 slices shown]
[im 1/5]
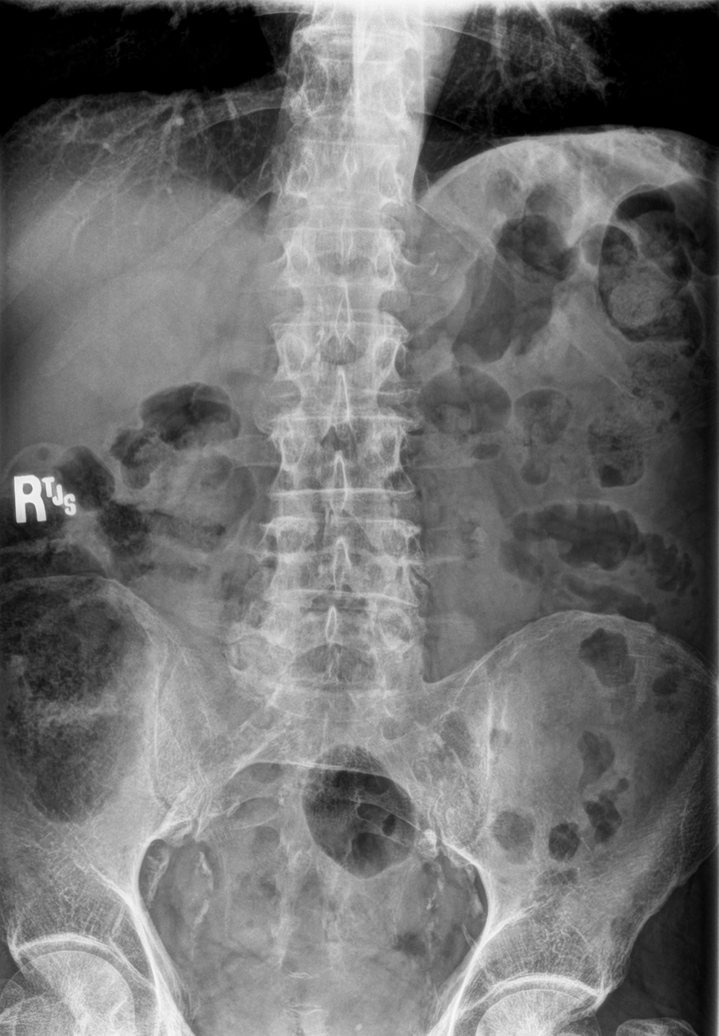
[im 2/5]
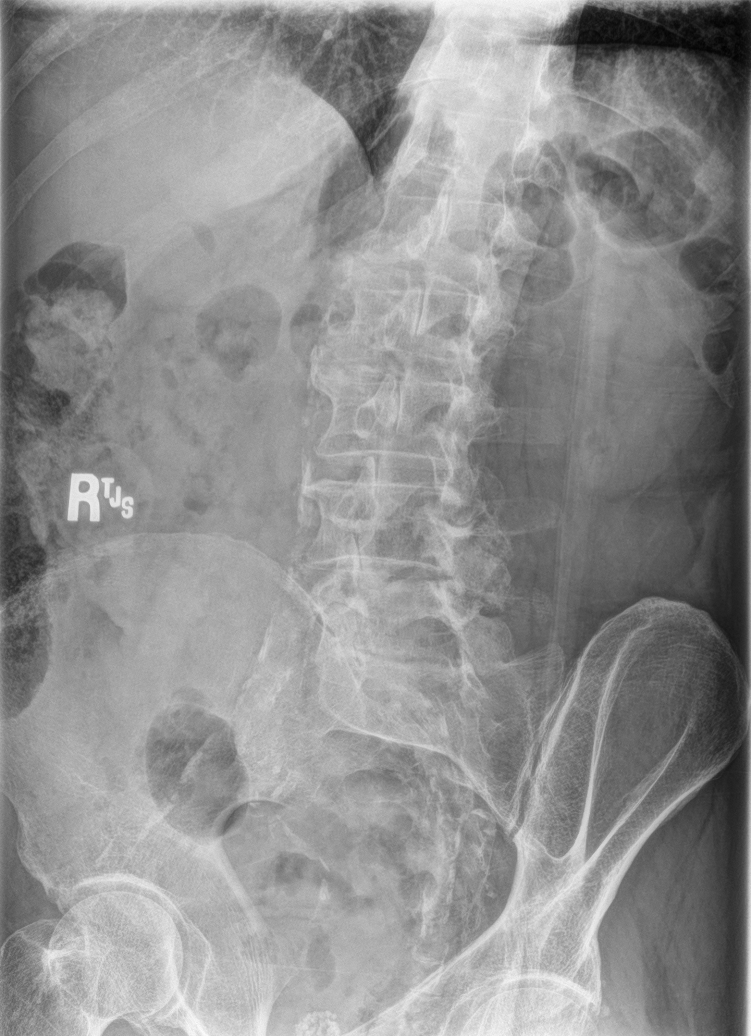
[im 3/5]
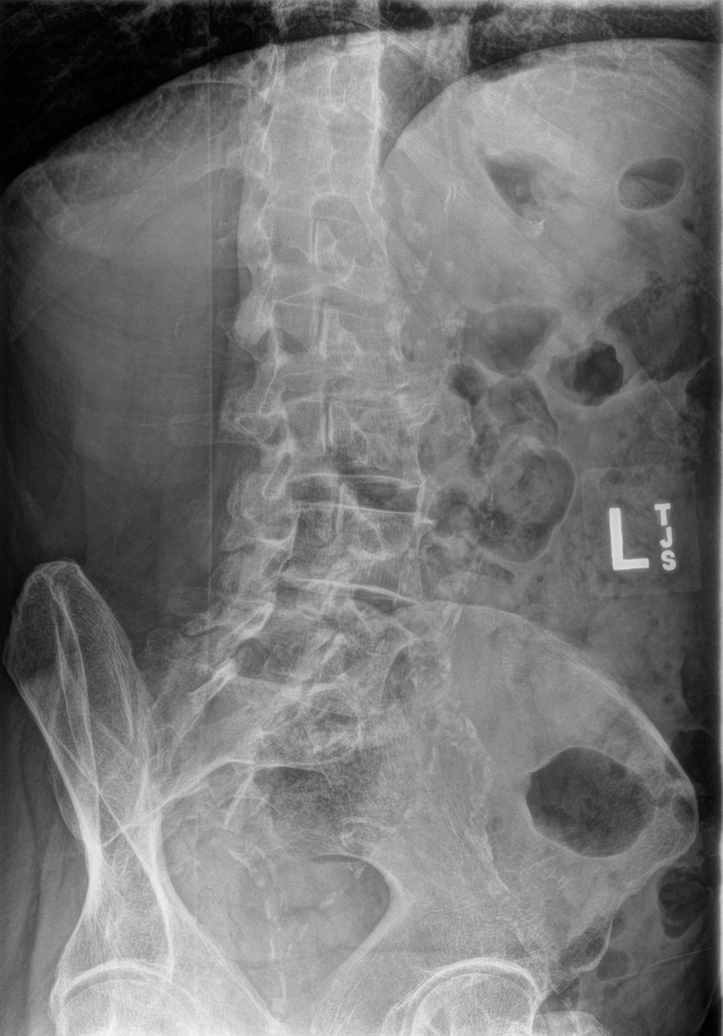
[im 4/5]
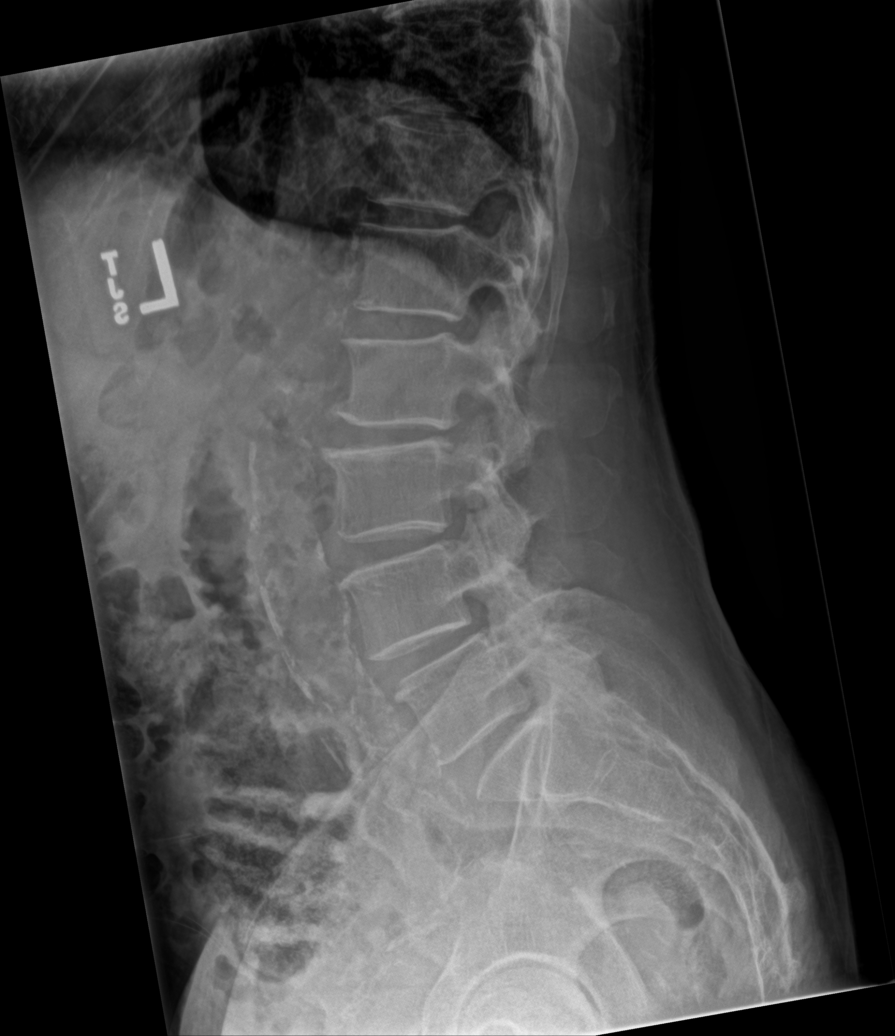
[im 5/5]
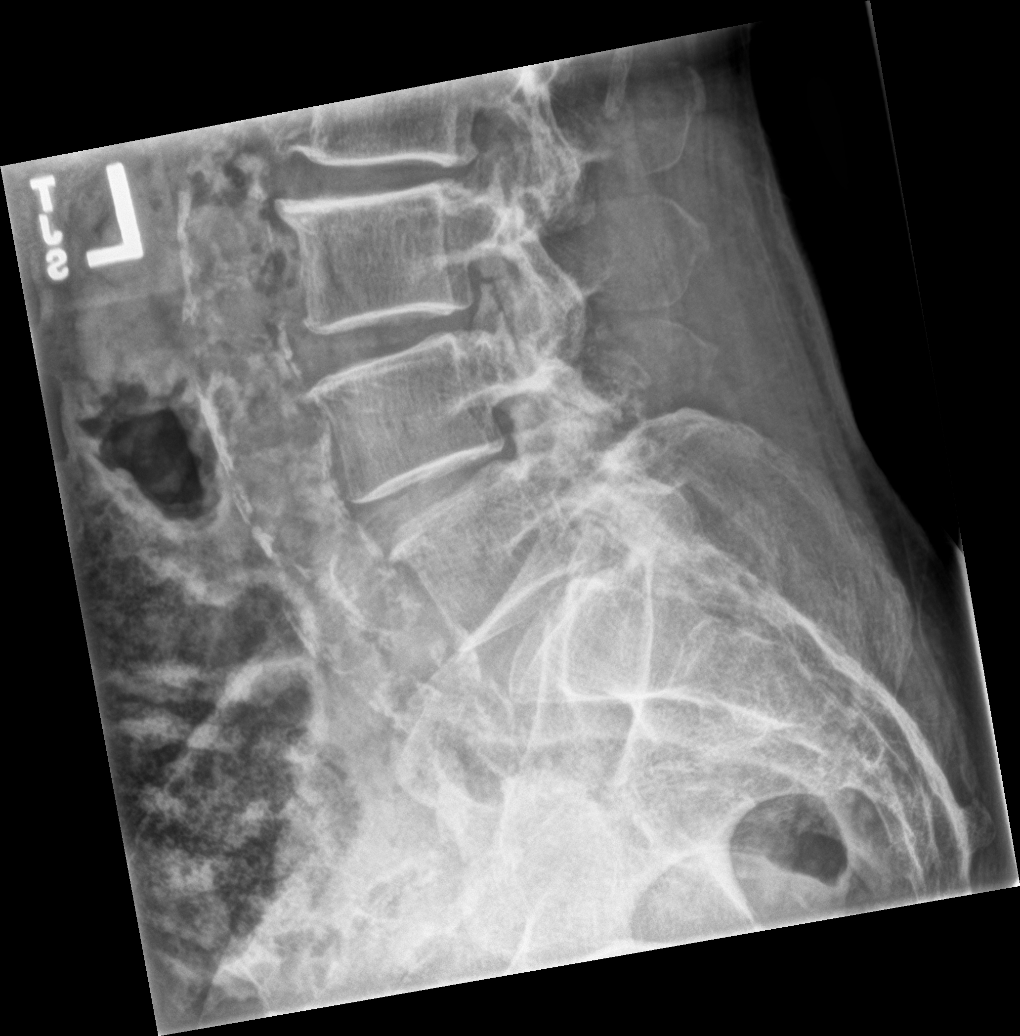

[5 of 5 positions shown; findings below may reference images not displayed]

FINDINGS: Extensive atherosclerotic calcifications throughout the visualized
aorta and iliac arteries.

No evidence of fracture. The vertebral body heights are maintained.
Intervertebral disc spaces are also largely maintained. No
significant degenerative disc disease. Moderate facet hypertrophy
and sclerosis on the right at L4-L5 and L5-S1. No lytic or blastic
osseous lesion. Unremarkable visualized bowel gas pattern.
IMPRESSION: 1. Right greater than left facet arthropathy at L4-L5 and L5-S1.
2. No evidence of fracture, significant degenerative disc disease or
bony lesion.
3. Significant aortoiliac atherosclerotic vascular calcifications.

## 2022-03-21 ENCOUNTER — Other Ambulatory Visit: Payer: Self-pay | Admitting: Nurse Practitioner

## 2022-03-21 DIAGNOSIS — F17210 Nicotine dependence, cigarettes, uncomplicated: Secondary | ICD-10-CM

## 2022-03-29 ENCOUNTER — Encounter: Payer: Self-pay | Admitting: Nurse Practitioner

## 2022-03-29 ENCOUNTER — Ambulatory Visit: Payer: Medicaid Other | Admitting: Nurse Practitioner

## 2022-03-29 VITALS — BP 140/75 | HR 70 | Temp 98.0°F | Resp 16 | Ht 68.0 in | Wt 150.4 lb

## 2022-03-29 DIAGNOSIS — Z658 Other specified problems related to psychosocial circumstances: Secondary | ICD-10-CM

## 2022-03-29 DIAGNOSIS — I10 Essential (primary) hypertension: Secondary | ICD-10-CM

## 2022-03-29 DIAGNOSIS — I700 Atherosclerosis of aorta: Secondary | ICD-10-CM | POA: Diagnosis not present

## 2022-03-29 MED ORDER — ROSUVASTATIN CALCIUM 10 MG PO TABS: 10.0000 mg | ORAL_TABLET | Freq: Every day | ORAL | 1 refills | Status: DC

## 2022-03-29 NOTE — Progress Notes (Signed)
[facility] [place], [place] [address]  Internal MEDICINE  Office Visit Note  Patient Name: Alex Bowers  [id number]  [id number]  Date of Service: 03/29/2022  Chief Complaint  Patient presents with   Follow-up   Hypertension   Hyperlipidemia    HPI [name] presents for a follow up visit for hypertension  --BP is stable with current medications --need refill of rosuvastatin -- has Physical in September   Current Medication: Outpatient Encounter Medications as of 03/29/2022  Medication Sig   amLODipine (NORVASC) 5 MG tablet Take 1 tablet (5 mg total) by mouth daily.   aspirin EC 81 MG tablet Take 81 mg by mouth daily.   buPROPion (WELLBUTRIN XL) 300 MG 24 hr tablet TAKE 1 TABLET BY MOUTH EVERY DAY   citalopram (CELEXA) 40 MG tablet Take 20 mg by mouth daily.    finasteride (PROSCAR) 5 MG tablet Take 1 tablet (5 mg total) by mouth daily.   lisinopril (ZESTRIL) 40 MG tablet Take 1 tablet (40 mg total) by mouth daily.   meloxicam (MOBIC) 15 MG tablet TAKE 1 TABLET (15 MG TOTAL) BY MOUTH DAILY.   methocarbamol (ROBAXIN) 500 MG tablet TAKE 1 TABLET BY MOUTH THREE TIMES A DAY AS NEEDED FOR MUSCLE SPASMS   nicotine polacrilex (COMMIT) 4 MG lozenge SMARTSIG:1 Lozenge(s) By Mouth Every 6-8 Hours PRN   tadalafil (CIALIS) 5 MG tablet Take 1 tablet (5 mg total) by mouth daily as needed for erectile dysfunction.   [DISCONTINUED] rosuvastatin (CRESTOR) 10 MG tablet Take 1 tablet (10 mg total) by mouth daily. For high cholesterol   rosuvastatin (CRESTOR) 10 MG tablet Take 1 tablet (10 mg total) by mouth daily. For high cholesterol   No facility-administered encounter medications on file as of 03/29/2022.    Surgical History: Past Surgical History:  Procedure Laterality Date   COLONOSCOPY  2015   HERNIA REPAIR Right 06/09/14   inguinal    teeth removal       Medical History: Past Medical History:  Diagnosis Date   Dysplastic nevus 07/08/2014    Right proximal lateral deltoid. Sever atypia, lateral margin involved. Excised 09/07/2014, margins free.   Dysplastic nevus 11/16/2014   Right distal post. deltoid. Melanocytic hyperplasia and incidental dysplastic nevus with mild atypia, margins free. Excised.   Dysplastic nevus 07/03/2021   L lat buttocks - moderate   History of dysplastic nevus 05/18/2020   left anterior deltoid, severe / shave removal   Hyperlipidemia    Hypertension    Personal history of tobacco use, presenting hazards to health 12/29/2015   Stroke [facility]) 2014    Family History: Family History  Problem Relation Age of Onset   Colon polyps Mother    Heart attack Father    Cancer - Other Brother        phageal   Prostate cancer Neg Hx    Kidney cancer Neg Hx    Bladder Cancer Neg Hx     Social History   Socioeconomic History   Marital status: Divorced    Spouse name: Not on file   Number of children: Not on file   Years of education: Not on file   Highest education level: Not on file  Occupational History   Not on file  Tobacco Use   Smoking status: Every Day    Packs/day: 2.00    Years: 37.00    Total pack years: 74.00    Types: Cigarettes   Smokeless tobacco: Never  Tobacco comments:    2 packs daily, trying to cut down to 1 pack/vaping  Vaping Use   Vaping Use: Every day  Substance and Sexual Activity   Alcohol use: Yes    Comment: occasionally   Drug use: No   Sexual activity: Not on file  Other Topics Concern   Not on file  Social History Narrative   Not on file   Social Determinants of Health   Financial Resource Strain: Not on file  Food Insecurity: Not on file  Transportation Needs: Not on file  Physical Activity: Not on file  Stress: Not on file  Social Connections: Not on file  Intimate Partner Violence: Not on file      Review of Systems  Constitutional:  Negative for chills, fatigue and unexpected weight change.  HENT:  Negative for congestion, rhinorrhea, sneezing  and sore throat.   Eyes:  Negative for redness.  Respiratory:  Negative for cough, chest tightness and shortness of breath.   Cardiovascular:  Negative for chest pain and palpitations.  Gastrointestinal:  Negative for abdominal pain, constipation, diarrhea, nausea and vomiting.  Genitourinary:  Negative for dysuria and frequency.  Musculoskeletal:  Negative for arthralgias, back pain, joint swelling and neck pain.  Skin:  Negative for rash.  Neurological: Negative.  Negative for tremors and numbness.  Hematological:  Negative for adenopathy. Does not bruise/bleed easily.  Psychiatric/Behavioral:  Negative for behavioral problems (Depression), sleep disturbance and suicidal ideas. The patient is not nervous/anxious.     Vital Signs: BP (!) 140/75 Comment: 145/90  Pulse 70   Temp 98 F (36.7 C)   Resp 16   Ht '5\' 8"'$  (1.727 m)   Wt 150 lb 6.4 oz (68.2 kg)   SpO2 98%   BMI 22.87 kg/m    Physical Exam Vitals reviewed.  Constitutional:      General: He is not in acute distress.    Appearance: Normal appearance. He is normal weight. He is not ill-appearing.  HENT:     Head: Normocephalic and atraumatic.  Eyes:     Pupils: Pupils are equal, round, and reactive to light.  Cardiovascular:     Rate and Rhythm: Normal rate and regular rhythm.  Pulmonary:     Effort: Pulmonary effort is normal. No respiratory distress.  Neurological:     Mental Status: He is alert and oriented to person, place, and time.  Psychiatric:        Mood and Affect: Mood normal.        Behavior: Behavior normal.        Assessment/Plan: 1. Essential hypertension Stable, continue current medications as prescribed.   2. Atherosclerosis of aorta (HCC) Refills ordered, continue rosuvastatin as prescribed, will check lipid panel with annual labs coming up in september - rosuvastatin (CRESTOR) 10 MG tablet; Take 1 tablet (10 mg total) by mouth daily. For high cholesterol  Dispense: 90 tablet; Refill:  1  3. Psychosocial stressors Still working on selling his fathers house, traveling between [place] and [place], paying for two houses. Currently smoking, talked briefly about smoking cessation but not ready yet due to stress.    General Counseling: clarkson rosselli understanding of the findings of todays visit and agrees with plan of treatment. I have discussed any further diagnostic evaluation that may be needed or ordered today. We also reviewed his medications today. he has been encouraged to call the office with any questions or concerns that should arise related to todays visit.    No orders  of the defined types were placed in this encounter.   Meds ordered this encounter  Medications   rosuvastatin (CRESTOR) 10 MG tablet    Sig: Take 1 tablet (10 mg total) by mouth daily. For high cholesterol    Dispense:  90 tablet    Refill:  1    Return for previously scheduled, CPE, Harris Kistler PCP in september.   Total time spent:30 Minutes Time spent includes review of chart, medications, test results, and follow up plan with the patient.   [place] Controlled Substance Database was reviewed by me.  This patient was seen by Jonetta Osgood, FNP-C in collaboration with Dr. Clayborn Bigness as a part of collaborative care agreement.   Yovani Cogburn R. Valetta Fuller, MSN, FNP-C Internal medicine

## 2022-04-05 ENCOUNTER — Encounter: Payer: Medicaid Other | Admitting: Nurse Practitioner

## 2022-04-05 ENCOUNTER — Encounter: Payer: Self-pay | Admitting: Nurse Practitioner

## 2022-04-07 ENCOUNTER — Other Ambulatory Visit: Payer: Self-pay | Admitting: Nurse Practitioner

## 2022-04-07 DIAGNOSIS — M542 Cervicalgia: Secondary | ICD-10-CM

## 2022-04-26 ENCOUNTER — Encounter: Payer: Self-pay | Admitting: Nurse Practitioner

## 2022-04-26 ENCOUNTER — Ambulatory Visit (INDEPENDENT_AMBULATORY_CARE_PROVIDER_SITE_OTHER): Payer: Medicaid Other | Admitting: Nurse Practitioner

## 2022-04-26 VITALS — BP 140/85 | HR 79 | Temp 98.4°F | Resp 16 | Ht 68.0 in | Wt 150.2 lb

## 2022-04-26 DIAGNOSIS — R300 Dysuria: Secondary | ICD-10-CM

## 2022-04-26 DIAGNOSIS — E559 Vitamin D deficiency, unspecified: Secondary | ICD-10-CM

## 2022-04-26 DIAGNOSIS — Z0001 Encounter for general adult medical examination with abnormal findings: Secondary | ICD-10-CM

## 2022-04-26 DIAGNOSIS — I700 Atherosclerosis of aorta: Secondary | ICD-10-CM | POA: Diagnosis not present

## 2022-04-26 DIAGNOSIS — I10 Essential (primary) hypertension: Secondary | ICD-10-CM

## 2022-04-26 DIAGNOSIS — H524 Presbyopia: Secondary | ICD-10-CM

## 2022-04-26 DIAGNOSIS — E871 Hypo-osmolality and hyponatremia: Secondary | ICD-10-CM

## 2022-04-26 DIAGNOSIS — F331 Major depressive disorder, recurrent, moderate: Secondary | ICD-10-CM

## 2022-04-26 DIAGNOSIS — E538 Deficiency of other specified B group vitamins: Secondary | ICD-10-CM

## 2022-04-26 DIAGNOSIS — E782 Mixed hyperlipidemia: Secondary | ICD-10-CM

## 2022-04-26 DIAGNOSIS — E291 Testicular hypofunction: Secondary | ICD-10-CM

## 2022-04-26 DIAGNOSIS — F17210 Nicotine dependence, cigarettes, uncomplicated: Secondary | ICD-10-CM

## 2022-04-26 NOTE — Progress Notes (Signed)
[facility] [place], [place] [address]  Internal MEDICINE  Office Visit Note  Patient Name: Alex Bowers  [id number]  [id number]  Date of Service: 04/26/2022  Chief Complaint  Patient presents with   Annual Exam   Hyperlipidemia   Hypertension    HPI Terrion presents for an annual well visit and physical exam.  -- Well-appearing 66 year old male with hypertension, aortic atherosclerosis, atopic dermatitis, ADHD, major depressive disorder and current nicotine use. -- Patient has eliminated 1 major stressor in his life: He has finally sold his dad's house in [place]. Preventive screenings --routine colonoscopy will be due in 2025 and he has had several PSA levels that were all normal with the most recent being from June this year -- Blood pressure is better controlled with current medications and is improving with his stress level decreasing.  As he works towards moving back into his home in [place] and out of his dad's house his stress level should improve even more. -- In the past year he has quit cigarette smoking but he is still vaping.  All the ultimate goal is for him to quit vaping completely but he is not ready to do that yet -- He will be due for routine labs in a couple of months. Vital signs are stable weight and BMI are normal. He does not need any refills at this time.  He denies any new or worsening pains and has no other concerns today     Current Medication: Outpatient Encounter Medications as of 04/26/2022  Medication Sig   amLODipine (NORVASC) 5 MG tablet Take 1 tablet (5 mg total) by mouth daily.   aspirin EC 81 MG tablet Take 81 mg by mouth daily.   buPROPion (WELLBUTRIN XL) 300 MG 24 hr tablet TAKE 1 TABLET BY MOUTH EVERY DAY   citalopram (CELEXA) 40 MG tablet Take 20 mg by mouth daily.    finasteride (PROSCAR) 5 MG tablet Take 1 tablet (5 mg total) by mouth daily.   lisinopril (ZESTRIL) 40 MG tablet Take 1 tablet  (40 mg total) by mouth daily.   meloxicam (MOBIC) 15 MG tablet TAKE 1 TABLET (15 MG TOTAL) BY MOUTH DAILY.   methocarbamol (ROBAXIN) 500 MG tablet TAKE 1 TABLET BY MOUTH THREE TIMES A DAY AS NEEDED FOR MUSCLE SPASMS   nicotine polacrilex (COMMIT) 4 MG lozenge SMARTSIG:1 Lozenge(s) By Mouth Every 6-8 Hours PRN   rosuvastatin (CRESTOR) 10 MG tablet Take 1 tablet (10 mg total) by mouth daily. For high cholesterol   tadalafil (CIALIS) 5 MG tablet Take 1 tablet (5 mg total) by mouth daily as needed for erectile dysfunction.   No facility-administered encounter medications on file as of 04/26/2022.    Surgical History: Past Surgical History:  Procedure Laterality Date   COLONOSCOPY  2015   HERNIA REPAIR Right 06/09/14   inguinal    teeth removal       Medical History: Past Medical History:  Diagnosis Date   Dysplastic nevus 07/08/2014   Right proximal lateral deltoid. Sever atypia, lateral margin involved. Excised 09/07/2014, margins free.   Dysplastic nevus 11/16/2014   Right distal post. deltoid. Melanocytic hyperplasia and incidental dysplastic nevus with mild atypia, margins free. Excised.   Dysplastic nevus 07/03/2021   L lat buttocks - moderate   History of dysplastic nevus 05/18/2020   left anterior deltoid, severe / shave removal   Hyperlipidemia    Hypertension    Personal history of tobacco use, presenting hazards to health  12/29/2015   Stroke ([place]) 2014    Family History: Family History  Problem Relation Age of Onset   Colon polyps Mother    Heart attack Father    Cancer - Other Brother        phageal   Prostate cancer Neg Hx    Kidney cancer Neg Hx    Bladder Cancer Neg Hx     Social History   Socioeconomic History   Marital status: Divorced    Spouse name: Not on file   Number of children: Not on file   Years of education: Not on file   Highest education level: Not on file  Occupational History   Not on file  Tobacco Use   Smoking status: Every Day     Types: Pipe    Start date: 02/19/2022   Smokeless tobacco: Never   Tobacco comments:    2 packs daily, trying to cut down to 1 pack/vaping No more smoking only vaping now 04/26/22  Vaping Use   Vaping Use: Every day  Substance and Sexual Activity   Alcohol use: Yes    Comment: occasionally   Drug use: No   Sexual activity: Not on file  Other Topics Concern   Not on file  Social History Narrative   Not on file   Social Determinants of Health   Financial Resource Strain: Not on file  Food Insecurity: Not on file  Transportation Needs: Not on file  Physical Activity: Not on file  Stress: Not on file  Social Connections: Not on file  Intimate Partner Violence: Not on file      Review of Systems  Constitutional:  Negative for activity change, appetite change, chills, fatigue, fever and unexpected weight change.  HENT: Negative.  Negative for congestion, ear pain, rhinorrhea, sore throat and trouble swallowing.   Eyes: Negative.   Respiratory: Negative.  Negative for cough, chest tightness, shortness of breath and wheezing.   Cardiovascular: Negative.  Negative for chest pain and palpitations.  Gastrointestinal: Negative.  Negative for abdominal pain, blood in stool, constipation, diarrhea, nausea and vomiting.  Endocrine: Negative.   Genitourinary: Negative.  Negative for difficulty urinating, dysuria, frequency, hematuria and urgency.  Musculoskeletal: Negative.  Negative for arthralgias, back pain, joint swelling, myalgias and neck pain.  Skin: Negative.  Negative for rash and wound.  Allergic/Immunologic: Negative.  Negative for immunocompromised state.  Neurological: Negative.  Negative for dizziness, seizures, numbness and headaches.  Hematological: Negative.   Psychiatric/Behavioral: Negative.  Negative for behavioral problems, self-injury and suicidal ideas. The patient is not nervous/anxious.     Vital Signs: BP (!) 140/85   Pulse 79   Temp 98.4 F (36.9 C)   Resp  16   Ht _0  (1.727 m)   Wt 150 lb 3.2 oz (68.1 kg)   SpO2 98%   BMI 22.84 kg/m    Physical Exam Vitals reviewed.  Constitutional:      General: He is awake. He is not in acute distress.    Appearance: Normal appearance. He is well-developed, well-groomed and normal weight. He is not ill-appearing or diaphoretic.  HENT:     Head: Normocephalic and atraumatic.     Right Ear: Tympanic membrane, ear canal and external ear normal.     Left Ear: Tympanic membrane, ear canal and external ear normal.     Nose: Nose normal. No congestion or rhinorrhea.     Mouth/Throat:     Lips: Pink.     Mouth: Mucous membranes  are moist.     Pharynx: Oropharynx is clear. Uvula midline. No oropharyngeal exudate.  Eyes:     General: Lids are normal. Vision grossly intact. Gaze aligned appropriately. No scleral icterus.       Right eye: No discharge.        Left eye: No discharge.     Extraocular Movements: Extraocular movements intact.     Conjunctiva/sclera: Conjunctivae normal.     Pupils: Pupils are equal, round, and reactive to light.     Funduscopic exam:    Right eye: Red reflex present.        Left eye: Red reflex present. Neck:     Thyroid: No thyromegaly.     Vascular: No carotid bruit or JVD.     Trachea: Trachea and phonation normal. No tracheal deviation.  Cardiovascular:     Rate and Rhythm: Normal rate and regular rhythm.     Pulses:          Carotid pulses are 3+ on the right side and 3+ on the left side.      Radial pulses are 2+ on the right side and 2+ on the left side.       Posterior tibial pulses are 2+ on the right side and 2+ on the left side.     Heart sounds: Normal heart sounds, S1 normal and S2 normal. No murmur heard.    No friction rub. No gallop.  Pulmonary:     Effort: Pulmonary effort is normal. No respiratory distress.     Breath sounds: Normal breath sounds. No stridor. No wheezing or rales.  Chest:     Chest wall: No tenderness.  Abdominal:     General:  Bowel sounds are normal. There is no distension.     Palpations: Abdomen is soft. There is no mass.     Tenderness: There is no abdominal tenderness. There is no guarding or rebound.  Musculoskeletal:        General: No deformity.     Cervical back: Neck supple. Tenderness present. Decreased range of motion (due to possible pulled muscle.).     Right lower leg: No edema.     Left lower leg: No edema.  Lymphadenopathy:     Cervical: No cervical adenopathy.  Skin:    General: Skin is warm and dry.     Capillary Refill: Capillary refill takes less than 2 seconds.     Coloration: Skin is not pale.     Findings: No erythema or rash.  Neurological:     Mental Status: He is alert and oriented to person, place, and time.     Cranial Nerves: No cranial nerve deficit.     Motor: No abnormal muscle tone.     Coordination: Coordination normal.     Gait: Gait normal.     Deep Tendon Reflexes: Reflexes are normal and symmetric.  Psychiatric:        Mood and Affect: Mood normal.        Behavior: Behavior normal. Behavior is cooperative.        Thought Content: Thought content normal.        Judgment: Judgment normal.        Assessment/Plan: 1. Encounter for general adult medical examination with abnormal findings Age-appropriate preventive screenings and vaccinations discussed, annual physical exam completed. Routine labs for health maintenance ordered, see below. PHM updated.  - Lipid Profile - CBC with Differential/Platelet - CMP14+EGFR - TSH + free T4 - B12 and Folate  Panel - Vitamin D (25 hydroxy)  2. Atherosclerosis of aorta (HCC) Routine labs ordered - Lipid Profile - TSH + free T4  3. Hyponatremia Routine lab ordered - CMP14+EGFR  4. Vitamin D deficiency Routine lab ordered - Vitamin D (25 hydroxy)  5. B12 deficiency Routine labs ordered - CBC with Differential/Platelet - B12 and Folate Panel  6. Dysuria Routine urinalysis done - UA/M w/rflx Culture, Routine -  Microscopic Examination - Urine Culture, Reflex  7. Cigarette nicotine dependence without complication He is working on quitting smoking, has been smoking less but vaping more. Declines assistance at this time.       General Counseling: shay bartoli understanding of the findings of todays visit and agrees with plan of treatment. I have discussed any further diagnostic evaluation that may be needed or ordered today. We also reviewed his medications today. he has been encouraged to call the office with any questions or concerns that should arise related to todays visit.    Orders Placed This Encounter  Procedures   UA/M w/rflx Culture, Routine   Lipid Profile   CBC with Differential/Platelet   CMP14+EGFR   TSH + free T4   B12 and Folate Panel   Vitamin D (25 hydroxy)    No orders of the defined types were placed in this encounter.   Return in about 3 months (around 07/26/2022) for F/U, BP check, Foster Frericks PCP.   Total time spent:30 Minutes Time spent includes review of chart, medications, test results, and follow up plan with the patient.   [place] Controlled Substance Database was reviewed by me.  This patient was seen by Jonetta Osgood, FNP-C in collaboration with Dr. Clayborn Bigness as a part of collaborative care agreement.  Arlyne Brandes R. Valetta Fuller, MSN, FNP-C Internal medicine

## 2022-04-30 LAB — UA/M W/RFLX CULTURE, ROUTINE
Bilirubin, UA: NEGATIVE
Glucose, UA: NEGATIVE
Ketones, UA: NEGATIVE
Nitrite, UA: NEGATIVE
Protein,UA: NEGATIVE
RBC, UA: NEGATIVE
Specific Gravity, UA: 1.009 (ref 1.005–1.030)
Urobilinogen, Ur: 0.2 mg/dL (ref 0.2–1.0)
pH, UA: 7.5 (ref 5.0–7.5)

## 2022-04-30 LAB — MICROSCOPIC EXAMINATION
Bacteria, UA: NONE SEEN
Casts: NONE SEEN /lpf
Epithelial Cells (non renal): NONE SEEN /hpf (ref 0–10)
RBC, Urine: NONE SEEN /hpf (ref 0–2)

## 2022-04-30 LAB — URINE CULTURE, REFLEX

## 2022-05-20 ENCOUNTER — Encounter: Payer: Self-pay | Admitting: Nurse Practitioner

## 2022-06-13 ENCOUNTER — Other Ambulatory Visit: Payer: Self-pay | Admitting: Nurse Practitioner

## 2022-06-13 DIAGNOSIS — M542 Cervicalgia: Secondary | ICD-10-CM

## 2022-06-24 ENCOUNTER — Encounter: Payer: Self-pay | Admitting: Nurse Practitioner

## 2022-07-09 ENCOUNTER — Ambulatory Visit: Payer: Medicare Other | Admitting: Dermatology

## 2022-07-09 DIAGNOSIS — Z86018 Personal history of other benign neoplasm: Secondary | ICD-10-CM

## 2022-07-09 DIAGNOSIS — L578 Other skin changes due to chronic exposure to nonionizing radiation: Secondary | ICD-10-CM | POA: Diagnosis not present

## 2022-07-09 DIAGNOSIS — L814 Other melanin hyperpigmentation: Secondary | ICD-10-CM | POA: Diagnosis not present

## 2022-07-09 DIAGNOSIS — Z1283 Encounter for screening for malignant neoplasm of skin: Secondary | ICD-10-CM | POA: Diagnosis not present

## 2022-07-09 DIAGNOSIS — D692 Other nonthrombocytopenic purpura: Secondary | ICD-10-CM

## 2022-07-09 DIAGNOSIS — D229 Melanocytic nevi, unspecified: Secondary | ICD-10-CM

## 2022-07-09 DIAGNOSIS — L821 Other seborrheic keratosis: Secondary | ICD-10-CM

## 2022-07-09 NOTE — Patient Instructions (Signed)
Due to recent changes in healthcare laws, you may see results of your pathology and/or laboratory studies on MyChart before the doctors have had a chance to review them. We understand that in some cases there may be results that are confusing or concerning to you. Please understand that not all results are received at the same time and often the doctors may need to interpret multiple results in order to provide you with the best plan of care or course of treatment. Therefore, we ask that you please give us 2 business days to thoroughly review all your results before contacting the office for clarification. Should we see a critical lab result, you will be contacted sooner.   If You Need Anything After Your Visit  If you have any questions or concerns for your doctor, please call our main line at [phone] and press option 4 to reach your doctor's medical assistant. If no one answers, please leave a voicemail as directed and we will return your call as soon as possible. Messages left after 4 pm will be answered the following business day.   You may also send us a message via MyChart. We typically respond to MyChart messages within 1-2 business days.  For prescription refills, please ask your pharmacy to contact our office. Our fax number is [phone].  If you have an urgent issue when the clinic is closed that cannot wait until the next business day, you can page your doctor at the number below.    Please note that while we do our best to be available for urgent issues outside of office hours, we are not available 24/7.   If you have an urgent issue and are unable to reach us, you may choose to seek medical care at your doctor's office, retail clinic, urgent care center, or emergency room.  If you have a medical emergency, please immediately call 911 or go to the emergency department.  Pager Numbers  - Dr. Kowalski: [phone]  - Dr. Moye: [phone]  - Dr. Stewart:  [phone]  In the event of inclement weather, please call our main line at [phone] for an update on the status of any delays or closures.  Dermatology Medication Tips: Please keep the boxes that topical medications come in in order to help keep track of the instructions about where and how to use these. Pharmacies typically print the medication instructions only on the boxes and not directly on the medication tubes.   If your medication is too expensive, please contact our office at [phone] option 4 or send us a message through MyChart.   We are unable to tell what your co-pay for medications will be in advance as this is different depending on your insurance coverage. However, we may be able to find a substitute medication at lower cost or fill out paperwork to get insurance to cover a needed medication.   If a prior authorization is required to get your medication covered by your insurance company, please allow us 1-2 business days to complete this process.  Drug prices often vary depending on where the prescription is filled and some pharmacies may offer cheaper prices.  The website www.goodrx.com contains coupons for medications through different pharmacies. The prices here do not account for what the cost may be with help from insurance (it may be cheaper with your insurance), but the website can give you the price if you did not use any insurance.  - You can print the associated coupon and take it with   your prescription to the pharmacy.  - You may also stop by our office during regular business hours and pick up a GoodRx coupon card.  - If you need your prescription sent electronically to a different pharmacy, notify our office through [facility] MyChart or by phone at [phone] option 4.     Si Usted Necesita Algo Despus de Su Visita  Tambin puede enviarnos un mensaje a travs de MyChart. Por lo general respondemos a los mensajes de MyChart en el transcurso de 1 a 2  das hbiles.  Para renovar recetas, por favor pida a su farmacia que se ponga en contacto con nuestra oficina. Nuestro nmero de fax es el [phone].  Si tiene un asunto urgente cuando la clnica est cerrada y que no puede esperar hasta el siguiente da hbil, puede llamar/localizar a su doctor(a) al nmero que aparece a continuacin.   Por favor, tenga en cuenta que aunque hacemos todo lo posible para estar disponibles para asuntos urgentes fuera del horario de oficina, no estamos disponibles las 24 horas del da, los 7 das de la semana.   Si tiene un problema urgente y no puede comunicarse con nosotros, puede optar por buscar atencin mdica  en el consultorio de su doctor(a), en una clnica privada, en un centro de atencin urgente o en una sala de emergencias.  Si tiene una emergencia mdica, por favor llame inmediatamente al 911 o vaya a la sala de emergencias.  Nmeros de bper  - Dr. Kowalski: [phone]  - Dra. Moye: [phone]  - Dra. Stewart: [phone]  En caso de inclemencias del tiempo, por favor llame a nuestra lnea principal al [phone] para una actualizacin sobre el estado de cualquier retraso o cierre.  Consejos para la medicacin en dermatologa: Por favor, guarde las cajas en las que vienen los medicamentos de uso tpico para ayudarle a seguir las instrucciones sobre dnde y cmo usarlos. Las farmacias generalmente imprimen las instrucciones del medicamento slo en las cajas y no directamente en los tubos del medicamento.   Si su medicamento es muy caro, por favor, pngase en contacto con nuestra oficina llamando al [phone] y presione la opcin 4 o envenos un mensaje a travs de MyChart.   No podemos decirle cul ser su copago por los medicamentos por adelantado ya que esto es diferente dependiendo de la cobertura de su seguro. Sin embargo, es posible que podamos encontrar un medicamento sustituto a menor costo o llenar un formulario para que el  seguro cubra el medicamento que se considera necesario.   Si se requiere una autorizacin previa para que su compaa de seguros cubra su medicamento, por favor permtanos de 1 a 2 das hbiles para completar este proceso.  Los precios de los medicamentos varan con frecuencia dependiendo del lugar de dnde se surte la receta y alguna farmacias pueden ofrecer precios ms baratos.  El sitio web www.goodrx.com tiene cupones para medicamentos de diferentes farmacias. Los precios aqu no tienen en cuenta lo que podra costar con la ayuda del seguro (puede ser ms barato con su seguro), pero el sitio web puede darle el precio si no utiliz ningn seguro.  - Puede imprimir el cupn correspondiente y llevarlo con su receta a la farmacia.  - Tambin puede pasar por nuestra oficina durante el horario de atencin regular y recoger una tarjeta de cupones de GoodRx.  - Si necesita que su receta se enve electrnicamente a una farmacia diferente, informe a nuestra oficina a travs de MyChart de [place]   o por telfono llamando al [phone] y presione la opcin 4.  

## 2022-07-09 NOTE — Progress Notes (Signed)
   Follow-Up Visit   Subjective  Alex Bowers is a 66 y.o. male who presents for the following: Annual Exam (History of dysplastic nevi - The patient presents for Total-Body Skin Exam (TBSE) for skin cancer screening and mole check.  The patient has spots, moles and lesions to be evaluated, some may be new or changing and the patient has concerns that these could be cancer./).  The following portions of the chart were reviewed this encounter and updated as appropriate:   Tobacco  Allergies  Meds  Problems  Med Hx  Surg Hx  Fam Hx     Review of Systems:  No other skin or systemic complaints except as noted in HPI or Assessment and Plan.  Objective  Well appearing patient in no apparent distress; mood and affect are within normal limits.  A full examination was performed including scalp, head, eyes, ears, nose, lips, neck, chest, axillae, abdomen, back, buttocks, bilateral upper extremities, bilateral lower extremities, hands, feet, fingers, toes, fingernails, and toenails. All findings within normal limits unless otherwise noted below.  Arms Purpura   Assessment & Plan   History of Dysplastic Nevi - No evidence of recurrence today - Recommend regular full body skin exams - Recommend daily broad spectrum sunscreen SPF 30+ to sun-exposed areas, reapply every 2 hours as needed.  - Call if any new or changing lesions are noted between office visits  Lentigines - Scattered tan macules - Due to sun exposure - Benign-appearing, observe - Recommend daily broad spectrum sunscreen SPF 30+ to sun-exposed areas, reapply every 2 hours as needed. - Call for any changes  Seborrheic Keratoses - Stuck-on, waxy, tan-brown papules and/or plaques  - Benign-appearing - Discussed benign etiology and prognosis. - Observe - Call for any changes  Melanocytic Nevi - Tan-brown and/or pink-flesh-colored symmetric macules and papules - Benign appearing on exam today - Observation -  Call clinic for new or changing moles - Recommend daily use of broad spectrum spf 30+ sunscreen to sun-exposed areas.   Hemangiomas - Red papules - Discussed benign nature - Observe - Call for any changes  Actinic Damage - Chronic condition, secondary to cumulative UV/sun exposure - diffuse scaly erythematous macules with underlying dyspigmentation - Recommend daily broad spectrum sunscreen SPF 30+ to sun-exposed areas, reapply every 2 hours as needed.  - Staying in the shade or wearing long sleeves, sun glasses (UVA+UVB protection) and wide brim hats (4-inch brim around the entire circumference of the hat) are also recommended for sun protection.  - Call for new or changing lesions.  Skin cancer screening performed today.  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  Treatable, but not curable. - Violaceous macules and patches - Benign - Related to trauma, age, sun damage and/or use of blood thinners, chronic use of topical and/or oral steroids - Observe - Can use OTC arnica containing moisturizer such as Dermend Bruise Formula if desired - Call for worsening or other concerns   Return in about 1 year (around 07/10/2023) for TBSE.  I, Ashok Cordia, CMA, am acting as scribe for Sarina Ser, MD . Documentation: I have reviewed the above documentation for accuracy and completeness, and I agree with the above.  Sarina Ser, MD

## 2022-07-21 LAB — LIPID PANEL
Chol/HDL Ratio: 2.2 ratio (ref 0.0–5.0)
Cholesterol, Total: 165 mg/dL (ref 100–199)
HDL: 76 mg/dL (ref >39)
LDL Chol Calc (NIH): 78 mg/dL (ref 0–99)
Triglycerides: 56 mg/dL (ref 0–149)
VLDL Cholesterol Cal: 11 mg/dL (ref 5–40)

## 2022-07-21 LAB — CMP14+EGFR
ALT: 23 IU/L (ref 0–44)
AST: 22 IU/L (ref 0–40)
Albumin/Globulin Ratio: 2.1 (ref 1.2–2.2)
Albumin: 4.8 g/dL (ref 3.9–4.9)
Alkaline Phosphatase: 65 IU/L (ref 44–121)
BUN/Creatinine Ratio: 12 (ref 10–24)
BUN: 10 mg/dL (ref 8–27)
Bilirubin Total: 0.6 mg/dL (ref 0.0–1.2)
CO2: 23 mmol/L (ref 20–29)
Calcium: 9.5 mg/dL (ref 8.6–10.2)
Chloride: 94 mmol/L — ABNORMAL LOW (ref 96–106)
Creatinine, Ser: 0.85 mg/dL (ref 0.76–1.27)
Globulin, Total: 2.3 g/dL (ref 1.5–4.5)
Glucose: 100 mg/dL — ABNORMAL HIGH (ref 70–99)
Potassium: 5.4 mmol/L — ABNORMAL HIGH (ref 3.5–5.2)
Sodium: 130 mmol/L — ABNORMAL LOW (ref 134–144)
Total Protein: 7.1 g/dL (ref 6.0–8.5)
eGFR: 96 mL/min/{1.73_m2} (ref >59)

## 2022-07-21 LAB — CBC WITH DIFFERENTIAL/PLATELET
Basophils Absolute: 0.1 10*3/uL (ref 0.0–0.2)
Basos: 1 %
EOS (ABSOLUTE): 0.2 10*3/uL (ref 0.0–0.4)
Eos: 3 %
Hematocrit: 40.2 % (ref 37.5–51.0)
Hemoglobin: 14 g/dL (ref 13.0–17.7)
Immature Grans (Abs): 0.1 10*3/uL (ref 0.0–0.1)
Immature Granulocytes: 1 %
Lymphocytes Absolute: 1.2 10*3/uL (ref 0.7–3.1)
Lymphs: 16 %
MCH: 34.2 pg — ABNORMAL HIGH (ref 26.6–33.0)
MCHC: 34.8 g/dL (ref 31.5–35.7)
MCV: 98 fL — ABNORMAL HIGH (ref 79–97)
Monocytes Absolute: 1.3 10*3/uL — ABNORMAL HIGH (ref 0.1–0.9)
Monocytes: 17 %
Neutrophils Absolute: 5 10*3/uL (ref 1.4–7.0)
Neutrophils: 62 %
Platelets: 258 10*3/uL (ref 150–450)
RBC: 4.09 x10E6/uL — ABNORMAL LOW (ref 4.14–5.80)
RDW: 12.1 % (ref 11.6–15.4)
WBC: 7.8 10*3/uL (ref 3.4–10.8)

## 2022-07-21 LAB — B12 AND FOLATE PANEL
Folate: 7.7 ng/mL (ref >3.0)
Vitamin B-12: 511 pg/mL (ref 232–1245)

## 2022-07-21 LAB — TSH+FREE T4
Free T4: 1.28 ng/dL (ref 0.82–1.77)
TSH: 1.22 u[IU]/mL (ref 0.450–4.500)

## 2022-07-21 LAB — VITAMIN D 25 HYDROXY (VIT D DEFICIENCY, FRACTURES): Vit D, 25-Hydroxy: 12.3 ng/mL — ABNORMAL LOW (ref 30.0–100.0)

## 2022-07-26 ENCOUNTER — Encounter: Payer: Self-pay | Admitting: Nurse Practitioner

## 2022-07-26 ENCOUNTER — Ambulatory Visit (INDEPENDENT_AMBULATORY_CARE_PROVIDER_SITE_OTHER): Payer: Medicaid Other | Admitting: Nurse Practitioner

## 2022-07-26 ENCOUNTER — Encounter: Payer: Self-pay | Admitting: Dermatology

## 2022-07-26 VITALS — BP 127/88 | HR 84 | Temp 98.2°F | Resp 16 | Ht 68.0 in | Wt 156.2 lb

## 2022-07-26 DIAGNOSIS — I10 Essential (primary) hypertension: Secondary | ICD-10-CM

## 2022-07-26 DIAGNOSIS — E559 Vitamin D deficiency, unspecified: Secondary | ICD-10-CM

## 2022-07-26 DIAGNOSIS — E871 Hypo-osmolality and hyponatremia: Secondary | ICD-10-CM | POA: Diagnosis not present

## 2022-07-26 MED ORDER — VITAMIN D (ERGOCALCIFEROL) 1.25 MG (50000 UNIT) PO CAPS: 50000.0000 [IU] | ORAL_CAPSULE | ORAL | 1 refills | Status: DC

## 2022-07-26 NOTE — Progress Notes (Signed)
[facility] [place], [place] [address]  Internal MEDICINE  Office Visit Note  Patient Name: Alex Bowers  [id number]  [id number]  Date of Service: 07/26/2022  Chief Complaint  Patient presents with   Follow-up   Hyperlipidemia   Hypertension    HPI Alex Bowers presents for a follow up visit for hypertension and lab results.BP is much better.  Lower stress level now that he has sold his father's house and moved back to [place], mostly normal, but has low vitamin D Still working on quitting smoking.    Current Medication: Outpatient Encounter Medications as of 07/26/2022  Medication Sig   amLODipine (NORVASC) 5 MG tablet Take 1 tablet (5 mg total) by mouth daily.   aspirin EC 81 MG tablet Take 81 mg by mouth daily.   buPROPion (WELLBUTRIN XL) 300 MG 24 hr tablet TAKE 1 TABLET BY MOUTH EVERY DAY   citalopram (CELEXA) 40 MG tablet Take 20 mg by mouth daily.    finasteride (PROSCAR) 5 MG tablet Take 1 tablet (5 mg total) by mouth daily.   lisinopril (ZESTRIL) 40 MG tablet Take 1 tablet (40 mg total) by mouth daily.   meloxicam (MOBIC) 15 MG tablet TAKE 1 TABLET (15 MG TOTAL) BY MOUTH DAILY.   methocarbamol (ROBAXIN) 500 MG tablet TAKE 1 TABLET BY MOUTH THREE TIMES A DAY AS NEEDED FOR MUSCLE SPASM   nicotine polacrilex (COMMIT) 4 MG lozenge SMARTSIG:1 Lozenge(s) By Mouth Every 6-8 Hours PRN   rosuvastatin (CRESTOR) 10 MG tablet Take 1 tablet (10 mg total) by mouth daily. For high cholesterol   tadalafil (CIALIS) 5 MG tablet Take 1 tablet (5 mg total) by mouth daily as needed for erectile dysfunction.   Vitamin D, Ergocalciferol, (DRISDOL) 1.25 MG (50000 UNIT) CAPS capsule Take 1 capsule (50,000 Units total) by mouth every 7 (seven) days.   No facility-administered encounter medications on file as of 07/26/2022.    Surgical History: Past Surgical History:  Procedure Laterality Date   COLONOSCOPY  2015   HERNIA REPAIR Right  06/09/14   inguinal    teeth removal       Medical History: Past Medical History:  Diagnosis Date   Dysplastic nevus 07/08/2014   Right proximal lateral deltoid. Sever atypia, lateral margin involved. Excised 09/07/2014, margins free.   Dysplastic nevus 11/16/2014   Right distal post. deltoid. Melanocytic hyperplasia and incidental dysplastic nevus with mild atypia, margins free. Excised.   Dysplastic nevus 07/03/2021   L lat buttocks - moderate   History of dysplastic nevus 05/18/2020   left anterior deltoid, severe / shave removal   Hyperlipidemia    Hypertension    Personal history of tobacco use, presenting hazards to health 12/29/2015   Stroke [facility]) 2014    Family History: Family History  Problem Relation Age of Onset   Colon polyps Mother    Heart attack Father    Cancer - Other Brother        phageal   Prostate cancer Neg Hx    Kidney cancer Neg Hx    Bladder Cancer Neg Hx     Social History   Socioeconomic History   Marital status: Divorced    Spouse name: Not on file   Number of children: Not on file   Years of education: Not on file   Highest education level: Not on file  Occupational History   Not on file  Tobacco Use   Smoking status: Every Day  Types: Pipe    Start date: 02/19/2022   Smokeless tobacco: Never   Tobacco comments:    2 packs daily, trying to cut down to 1 pack/vaping No more smoking only vaping now 04/26/22  Vaping Use   Vaping Use: Every day  Substance and Sexual Activity   Alcohol use: Yes    Comment: occasionally   Drug use: No   Sexual activity: Not on file  Other Topics Concern   Not on file  Social History Narrative   Not on file   Social Determinants of Health   Financial Resource Strain: Not on file  Food Insecurity: Not on file  Transportation Needs: Not on file  Physical Activity: Not on file  Stress: Not on file  Social Connections: Not on file  Intimate Partner Violence: Not on file      Review of Systems   Constitutional:  Negative for chills, fatigue and unexpected weight change.  HENT:  Negative for congestion, rhinorrhea, sneezing and sore throat.   Eyes:  Negative for redness.  Respiratory:  Negative for cough, chest tightness and shortness of breath.   Cardiovascular:  Negative for chest pain and palpitations.  Gastrointestinal:  Negative for abdominal pain, constipation, diarrhea, nausea and vomiting.  Genitourinary:  Negative for dysuria and frequency.  Musculoskeletal:  Negative for arthralgias, back pain, joint swelling and neck pain.  Skin:  Negative for rash.  Neurological: Negative.  Negative for tremors and numbness.  Hematological:  Negative for adenopathy. Does not bruise/bleed easily.  Psychiatric/Behavioral:  Negative for behavioral problems (Depression), sleep disturbance and suicidal ideas. The patient is not nervous/anxious.     Vital Signs: BP 127/88   Pulse 84   Temp 98.2 F (36.8 C)   Resp 16   Ht '5\' 8"'$  (1.727 m)   Wt 156 lb 3.2 oz (70.9 kg)   SpO2 97%   BMI 23.75 kg/m    Physical Exam Vitals reviewed.  Constitutional:      General: He is not in acute distress.    Appearance: Normal appearance. He is normal weight. He is not ill-appearing.  HENT:     Head: Normocephalic and atraumatic.  Eyes:     Pupils: Pupils are equal, round, and reactive to light.  Cardiovascular:     Rate and Rhythm: Normal rate and regular rhythm.  Pulmonary:     Effort: Pulmonary effort is normal. No respiratory distress.  Neurological:     Mental Status: He is alert and oriented to person, place, and time.  Psychiatric:        Mood and Affect: Mood normal.        Behavior: Behavior normal.        Assessment/Plan: 1. Essential hypertension Well controlled with current medications, continue as prescribed.   2. Vitamin D deficiency Start weekly vitamin D supplement, repeat level in 6 months.  - Vitamin D, Ergocalciferol, (DRISDOL) 1.25 MG (50000 UNIT) CAPS capsule;  Take 1 capsule (50,000 Units total) by mouth every 7 (seven) days.  Dispense: 12 capsule; Refill: 1  3. Hyponatremia Low sodium, discussed ways to increase sodium intake slightly.    General Counseling: Alex Bowers understanding of the findings of todays visit and agrees with plan of treatment. I have discussed any further diagnostic evaluation that may be needed or ordered today. We also reviewed his medications today. he has been encouraged to call the office with any questions or concerns that should arise related to todays visit.    No orders of the  defined types were placed in this encounter.   Meds ordered this encounter  Medications   Vitamin D, Ergocalciferol, (DRISDOL) 1.25 MG (50000 UNIT) CAPS capsule    Sig: Take 1 capsule (50,000 Units total) by mouth every 7 (seven) days.    Dispense:  12 capsule    Refill:  1    Return in about 3 months (around 10/25/2022) for F/U, BP check, Emilio Baylock PCP.   Total time spent:30 Minutes Time spent includes review of chart, medications, test results, and follow up plan with the patient.   [place] Controlled Substance Database was reviewed by me.  This patient was seen by Jonetta Osgood, FNP-C in collaboration with Dr. Clayborn Bigness as a part of collaborative care agreement.   Makayah Pauli R. Valetta Fuller, MSN, FNP-C Internal medicine

## 2022-08-12 ENCOUNTER — Other Ambulatory Visit: Payer: Self-pay | Admitting: Nurse Practitioner

## 2022-08-12 DIAGNOSIS — I10 Essential (primary) hypertension: Secondary | ICD-10-CM

## 2022-08-23 ENCOUNTER — Other Ambulatory Visit: Payer: Self-pay | Admitting: Nurse Practitioner

## 2022-08-23 DIAGNOSIS — G8929 Other chronic pain: Secondary | ICD-10-CM

## 2022-09-23 ENCOUNTER — Other Ambulatory Visit: Payer: Self-pay | Admitting: Nurse Practitioner

## 2022-09-23 DIAGNOSIS — F17210 Nicotine dependence, cigarettes, uncomplicated: Secondary | ICD-10-CM

## 2022-10-19 ENCOUNTER — Other Ambulatory Visit: Payer: Self-pay | Admitting: Nurse Practitioner

## 2022-10-19 DIAGNOSIS — I10 Essential (primary) hypertension: Secondary | ICD-10-CM

## 2022-10-19 DIAGNOSIS — I700 Atherosclerosis of aorta: Secondary | ICD-10-CM

## 2022-10-25 ENCOUNTER — Ambulatory Visit (INDEPENDENT_AMBULATORY_CARE_PROVIDER_SITE_OTHER): Payer: 59 | Admitting: Nurse Practitioner

## 2022-10-25 ENCOUNTER — Encounter: Payer: Self-pay | Admitting: Nurse Practitioner

## 2022-10-25 VITALS — BP 135/85 | HR 76 | Temp 98.2°F | Resp 16 | Ht 68.0 in | Wt 159.2 lb

## 2022-10-25 DIAGNOSIS — I10 Essential (primary) hypertension: Secondary | ICD-10-CM

## 2022-10-25 DIAGNOSIS — G8929 Other chronic pain: Secondary | ICD-10-CM | POA: Diagnosis not present

## 2022-10-25 DIAGNOSIS — I700 Atherosclerosis of aorta: Secondary | ICD-10-CM

## 2022-10-25 DIAGNOSIS — M5441 Lumbago with sciatica, right side: Secondary | ICD-10-CM | POA: Diagnosis not present

## 2022-10-25 NOTE — Progress Notes (Signed)
[facility] [place], [place] [address]  Internal MEDICINE  Office Visit Note  Patient Name: Alex Bowers  [id number]  [id number]  Date of Service: 10/25/2022  Chief Complaint  Patient presents with   Hypertension    HPI Alex Bowers presents for a follow-up visit for hypertension, high cholesterol and arthritis. Hypertension -- blood pressure is stable, takes lisinopril 40 mg daily and amlodipine 5 mg daily. High cholesterol -- takes rosuvastatin.  Arthritis -- stable, takes meloxicam and methocarbamol.     Current Medication: Outpatient Encounter Medications as of 10/25/2022  Medication Sig   amLODipine (NORVASC) 5 MG tablet TAKE 1 TABLET (5 MG TOTAL) BY MOUTH DAILY.   aspirin EC 81 MG tablet Take 81 mg by mouth daily.   buPROPion (WELLBUTRIN XL) 300 MG 24 hr tablet TAKE 1 TABLET BY MOUTH EVERY DAY   citalopram (CELEXA) 40 MG tablet Take 20 mg by mouth daily.    finasteride (PROSCAR) 5 MG tablet Take 1 tablet (5 mg total) by mouth daily.   lisinopril (ZESTRIL) 40 MG tablet TAKE 1 TABLET BY MOUTH EVERY DAY   meloxicam (MOBIC) 15 MG tablet TAKE 1 TABLET (15 MG TOTAL) BY MOUTH DAILY.   methocarbamol (ROBAXIN) 500 MG tablet TAKE 1 TABLET BY MOUTH THREE TIMES A DAY AS NEEDED FOR MUSCLE SPASM   nicotine polacrilex (COMMIT) 4 MG lozenge SMARTSIG:1 Lozenge(s) By Mouth Every 6-8 Hours PRN   rosuvastatin (CRESTOR) 10 MG tablet TAKE 1 TABLET (10 MG TOTAL) BY MOUTH DAILY. FOR HIGH CHOLESTEROL   tadalafil (CIALIS) 5 MG tablet Take 1 tablet (5 mg total) by mouth daily as needed for erectile dysfunction.   Vitamin D, Ergocalciferol, (DRISDOL) 1.25 MG (50000 UNIT) CAPS capsule Take 1 capsule (50,000 Units total) by mouth every 7 (seven) days.   No facility-administered encounter medications on file as of 10/25/2022.    Surgical History: Past Surgical History:  Procedure Laterality Date   COLONOSCOPY  2015   HERNIA REPAIR Right 06/09/14   inguinal    teeth  removal       Medical History: Past Medical History:  Diagnosis Date   Dysplastic nevus 07/08/2014   Right proximal lateral deltoid. Sever atypia, lateral margin involved. Excised 09/07/2014, margins free.   Dysplastic nevus 11/16/2014   Right distal post. deltoid. Melanocytic hyperplasia and incidental dysplastic nevus with mild atypia, margins free. Excised.   Dysplastic nevus 07/03/2021   L lat buttocks - moderate   History of dysplastic nevus 05/18/2020   left anterior deltoid, severe / shave removal   Hyperlipidemia    Hypertension    Personal history of tobacco use, presenting hazards to health 12/29/2015   Stroke [facility]) 2014    Family History: Family History  Problem Relation Age of Onset   Colon polyps Mother    Heart attack Father    Cancer - Other Brother        phageal   Prostate cancer Neg Hx    Kidney cancer Neg Hx    Bladder Cancer Neg Hx     Social History   Socioeconomic History   Marital status: Divorced    Spouse name: Not on file   Number of children: Not on file   Years of education: Not on file   Highest education level: Not on file  Occupational History   Not on file  Tobacco Use   Smoking status: Every Day    Types: Pipe    Start date: 02/19/2022   Smokeless tobacco:  Never   Tobacco comments:    2 packs daily, trying to cut down to 1 pack/vaping No more smoking only vaping now 04/26/22  Vaping Use   Vaping Use: Every day  Substance and Sexual Activity   Alcohol use: Yes    Comment: occasionally   Drug use: No   Sexual activity: Not on file  Other Topics Concern   Not on file  Social History Narrative   Not on file   Social Determinants of Health   Financial Resource Strain: Not on file  Food Insecurity: Not on file  Transportation Needs: Not on file  Physical Activity: Not on file  Stress: Not on file  Social Connections: Not on file  Intimate Partner Violence: Not on file      Review of Systems  Constitutional:  Negative for  chills, fatigue and unexpected weight change.  HENT:  Negative for congestion, rhinorrhea, sneezing and sore throat.   Eyes:  Negative for redness.  Respiratory:  Negative for cough, chest tightness and shortness of breath.   Cardiovascular:  Negative for chest pain and palpitations.  Gastrointestinal:  Negative for abdominal pain, constipation, diarrhea, nausea and vomiting.  Genitourinary:  Negative for dysuria and frequency.  Musculoskeletal:  Negative for arthralgias, back pain, joint swelling and neck pain.  Skin:  Negative for rash.  Neurological: Negative.  Negative for tremors and numbness.  Hematological:  Negative for adenopathy. Does not bruise/bleed easily.  Psychiatric/Behavioral:  Negative for behavioral problems (Depression), sleep disturbance and suicidal ideas. The patient is not nervous/anxious.     Vital Signs: BP 135/85 Comment: 166/93  Pulse 76   Temp 98.2 F (36.8 C)   Resp 16   Ht '5\' 8"'$  (1.727 m)   Wt 159 lb 3.2 oz (72.2 kg)   SpO2 96%   BMI 24.21 kg/m    Physical Exam Vitals reviewed.  Constitutional:      General: He is not in acute distress.    Appearance: Normal appearance. He is normal weight. He is not ill-appearing.  HENT:     Head: Normocephalic and atraumatic.  Eyes:     Pupils: Pupils are equal, round, and reactive to light.  Cardiovascular:     Rate and Rhythm: Normal rate and regular rhythm.  Pulmonary:     Effort: Pulmonary effort is normal. No respiratory distress.  Neurological:     Mental Status: He is alert and oriented to person, place, and time.  Psychiatric:        Mood and Affect: Mood normal.        Behavior: Behavior normal.        Assessment/Plan: 1. Essential hypertension Stable, continue lisinopril and amlodipine as prescribed.   2. Atherosclerosis of aorta (HCC) Continue rosuvastatin as prescribed.   3. Chronic right-sided low back pain with right-sided sciatica Continue meloxicam and methocarbamol as  prescribed.    General Counseling: ehaan stagnaro understanding of the findings of todays visit and agrees with plan of treatment. I have discussed any further diagnostic evaluation that may be needed or ordered today. We also reviewed his medications today. he has been encouraged to call the office with any questions or concerns that should arise related to todays visit.    No orders of the defined types were placed in this encounter.   No orders of the defined types were placed in this encounter.   Return for previously scheduled, CPE, Teague Goynes PCP in september. .   Total time spent:30 Minutes Time spent includes review  of chart, medications, test results, and follow up plan with the patient.   [place] Controlled Substance Database was reviewed by me.  This patient was seen by Jonetta Osgood, FNP-C in collaboration with Dr. Clayborn Bigness as a part of collaborative care agreement.   Azyria Osmon R. Valetta Fuller, MSN, FNP-C Internal medicine

## 2023-01-10 ENCOUNTER — Other Ambulatory Visit: Payer: Self-pay | Admitting: Nurse Practitioner

## 2023-01-10 DIAGNOSIS — E559 Vitamin D deficiency, unspecified: Secondary | ICD-10-CM

## 2023-02-04 ENCOUNTER — Other Ambulatory Visit: Payer: Self-pay | Admitting: Nurse Practitioner

## 2023-02-04 DIAGNOSIS — I10 Essential (primary) hypertension: Secondary | ICD-10-CM

## 2023-02-08 ENCOUNTER — Other Ambulatory Visit: Payer: Self-pay | Admitting: Nurse Practitioner

## 2023-02-08 DIAGNOSIS — G8929 Other chronic pain: Secondary | ICD-10-CM

## 2023-02-28 ENCOUNTER — Other Ambulatory Visit: Payer: 59

## 2023-02-28 ENCOUNTER — Other Ambulatory Visit: Payer: Self-pay

## 2023-02-28 DIAGNOSIS — N138 Other obstructive and reflux uropathy: Secondary | ICD-10-CM

## 2023-03-01 LAB — PSA: Prostate Specific Ag, Serum: 1.2 ng/mL (ref 0.0–4.0)

## 2023-03-04 NOTE — Progress Notes (Unsigned)
08/08/2018 4:46 PM   Alex Bowers Dec 26, 1955 [id number]  Referring provider: Sallyanne Kuster, NP [address] [place],  [place] [address]  Urological history: 1. High risk hematuria -smoker -CTU NED 2017 -cysto impressive intraluminal bilobar hypertrophy 2017  2. BPH with LU TS -PSA (02/2023) 1.2  -cysto impressive intraluminal bilobar hypertrophy 2017 -managed with tadalafil 5 mg daily and finasteride 5 mg daily   3. Urgency -contributing factors of age, BPH, BP meds, smoking,  -failed OAB meds -deemed a poor candidate for Interstim and Botox -insurance would not cover PTNS  4. ED -contributing factors of age, BPH, HTN, history of COVID, alcohol consumption and smoking  -tadalafil 5 mg daily    Chief Complaint  Patient presents with   Urinary Incontinence     HPI: Alex Bowers is a 67 y.o. male who presents for yearly follow up.   I PSS 16/3  PVR 0 mL   He is having recently weaker and weaker stream.  He is taking finasteride 5 mg daily and tadalafil 5 mg daily.  He is now interested in a bladder outlet procedure.  UA yellow clear, specific area less than 1.005, pH 5.5, 1+ leukocyte, 6-10 WBCs, 0-2 RBCs, 0-10 epithelial cells and few bacteria.   IPSS     Row Name 03/05/23 1400         International Prostate Symptom Score   How often have you had the sensation of not emptying your bladder? Less than half the time     How often have you had to urinate less than every two hours? Less than half the time     How often have you found you stopped and started again several times when you urinated? About half the time     How often have you found it difficult to postpone urination? Less than half the time     How often have you had a weak urinary stream? About half the time     How often have you had to strain to start urination? Less than half the time     How many times did you typically get up at night to urinate? 2 Times     Total  IPSS Score 16       Quality of Life due to urinary symptoms   If you were to spend the rest of your life with your urinary condition just the way it is now how would you feel about that? Mixed              IPSS     Row Name 03/05/23 1400         International Prostate Symptom Score   How often have you had the sensation of not emptying your bladder? Less than half the time     How often have you had to urinate less than every two hours? Less than half the time     How often have you found you stopped and started again several times when you urinated? About half the time     How often have you found it difficult to postpone urination? Less than half the time     How often have you had a weak urinary stream? About half the time     How often have you had to strain to start urination? Less than half the time     How many times did you typically get up at night to urinate? 2 Times  Total IPSS Score 16       Quality of Life due to urinary symptoms   If you were to spend the rest of your life with your urinary condition just the way it is now how would you feel about that? Mixed             Score:  1-7 Mild 8-19 Moderate 20-35 Severe   PMH: Past Medical History:  Diagnosis Date   Dysplastic nevus 07/08/2014   Right proximal lateral deltoid. Sever atypia, lateral margin involved. Excised 09/07/2014, margins free.   Dysplastic nevus 11/16/2014   Right distal post. deltoid. Melanocytic hyperplasia and incidental dysplastic nevus with mild atypia, margins free. Excised.   Dysplastic nevus 07/03/2021   L lat buttocks - moderate   History of dysplastic nevus 05/18/2020   left anterior deltoid, severe / shave removal   Hyperlipidemia    Hypertension    Personal history of tobacco use, presenting hazards to health 12/29/2015   Stroke [facility]) 2014    Surgical History: Past Surgical History:  Procedure Laterality Date   COLONOSCOPY  2015   HERNIA REPAIR Right 06/09/14    inguinal    teeth removal       Home Medications:  Allergies as of 03/05/2023   No Known Allergies      Medication List        Accurate as of March 05, 2023  4:46 PM. If you have any questions, ask your nurse or doctor.          amLODipine 5 MG tablet Commonly known as: NORVASC TAKE 1 TABLET (5 MG TOTAL) BY MOUTH DAILY.   aspirin EC 81 MG tablet Take 81 mg by mouth daily.   buPROPion 300 MG 24 hr tablet Commonly known as: WELLBUTRIN XL TAKE 1 TABLET BY MOUTH EVERY DAY   citalopram 40 MG tablet Commonly known as: CELEXA Take 20 mg by mouth daily.   finasteride 5 MG tablet Commonly known as: PROSCAR Take 1 tablet (5 mg total) by mouth daily.   lisinopril 40 MG tablet Commonly known as: ZESTRIL TAKE 1 TABLET BY MOUTH EVERY DAY   meloxicam 15 MG tablet Commonly known as: MOBIC TAKE 1 TABLET (15 MG TOTAL) BY MOUTH DAILY.   methocarbamol 500 MG tablet Commonly known as: ROBAXIN TAKE 1 TABLET BY MOUTH THREE TIMES A DAY AS NEEDED FOR MUSCLE SPASM   nicotine polacrilex 4 MG lozenge Commonly known as: COMMIT SMARTSIG:1 Lozenge(s) By Mouth Every 6-8 Hours PRN   rosuvastatin 10 MG tablet Commonly known as: CRESTOR TAKE 1 TABLET (10 MG TOTAL) BY MOUTH DAILY. FOR HIGH CHOLESTEROL   tadalafil 5 MG tablet Commonly known as: CIALIS Take 1 tablet (5 mg total) by mouth daily as needed for erectile dysfunction.   Vitamin D (Ergocalciferol) 1.25 MG (50000 UNIT) Caps capsule Commonly known as: DRISDOL TAKE 1 CAPSULE (50,000 UNITS TOTAL) BY MOUTH EVERY 7 (SEVEN) DAYS        Allergies: No Known Allergies  Family History: Family History  Problem Relation Age of Onset   Colon polyps Mother    Heart attack Father    Cancer - Other Brother        phageal   Prostate cancer Neg Hx    Kidney cancer Neg Hx    Bladder Cancer Neg Hx     Social History:  reports that he has been smoking pipe. He started smoking about a year ago. He has never used smokeless tobacco. He  reports current alcohol  use. He reports that he does not use drugs.  ROS: For pertinent review of systems please refer to history of present illness  Physical Exam: BP 138/70   Pulse 74   Ht 5\' 7"  (1.702 m)   Wt 150 lb (68 kg)   BMI 23.49 kg/m   Constitutional:  Well nourished. Alert and oriented, No acute distress. HEENT: [place] AT, moist mucus membranes.  Trachea midline Cardiovascular: No clubbing, cyanosis, or edema. Respiratory: Normal respiratory effort, no increased work of breathing. GU: No CVA tenderness.  No bladder fullness or masses.  Patient with circumcised phallus.   Urethral meatus is patent.  No penile discharge. No penile lesions or rashes. Scrotum without lesions, cysts, rashes and/or edema.  Testicles are located scrotally bilaterally. No masses are appreciated in the testicles. Left and right epididymis are normal. Rectal: Patient with  normal sphincter tone. Anus and perineum without scarring or rashes. No rectal masses are appreciated. Prostate is approximately 40 grams, could only palpate the apex, no nodules are appreciated. Seminal vesicles could not be palpated. Neurologic: Grossly intact, no focal deficits, moving all 4 extremities. Psychiatric: Normal mood and affect.   Laboratory Data:   Component     Latest Ref Rng 02/28/2023 Prostate Specific Ag, Serum     0.0 - 4.0 ng/mL 1.2    Component     Latest Ref Rng 07/20/2022 T4,Free(Direct)     0.82 - 1.77 ng/dL 1.61  TSH     0.960 - 4.540 uIU/mL 1.220    Component     Latest Ref Rng 07/20/2022 Glucose     70 - 99 mg/dL 981 (H)  BUN     8 - 27 mg/dL 10  Creatinine     1.91 - 1.27 mg/dL 4.78  BUN/Creatinine Ratio     10 - 24  12  Sodium     134 - 144 mmol/L 130 (L)  Potassium     3.5 - 5.2 mmol/L 5.4 (H)  Chloride     96 - 106 mmol/L 94 (L)  CO2     20 - 29 mmol/L 23  Calcium     8.6 - 10.2 mg/dL 9.5  Total Protein     6.0 - 8.5 g/dL 7.1  Albumin     3.9 - 4.9 g/dL 4.8   Globulin, Total     1.5 - 4.5 g/dL 2.3  Albumin/Globulin Ratio     1.2 - 2.2  2.1  Total Bilirubin     0.0 - 1.2 mg/dL 0.6  Alkaline Phosphatase     44 - 121 IU/L 65  AST     0 - 40 IU/L 22  ALT     0 - 44 IU/L 23  eGFR     >59 mL/min/1.73 96    Legend: (H) High (L) Low  Urinalysis  Results for orders placed or performed in visit on 03/05/23  Microscopic Examination   Urine  Result Value Ref Range   WBC, UA 6-10 (A) 0 - 5 /hpf   RBC, Urine 0-2 0 - 2 /hpf   Epithelial Cells (non renal) 0-10 0 - 10 /hpf   Bacteria, UA Few None seen/Few  Urinalysis, Complete  Result Value Ref Range   Specific Gravity, UA <1.005 (L) 1.005 - 1.030   pH, UA 5.5 5.0 - 7.5   Color, UA Yellow Yellow   Appearance Ur Clear Clear   Leukocytes,UA 1+ (A) Negative   Protein,UA Negative Negative/Trace   Glucose, UA Negative Negative  Ketones, UA Negative Negative   RBC, UA Negative Negative   Bilirubin, UA Negative Negative   Urobilinogen, Ur 0.2 0.2 - 1.0 mg/dL   Nitrite, UA Negative Negative   Microscopic Examination See below:   Bladder Scan (Post Void Residual) in office  Result Value Ref Range   Scan Result 0     I have reviewed the labs.  Pertinent Imaging 03/05/23 14:28 Scan Result 0   Assessment & Plan:     1. BPH with LU TS -PSA stable  -PVR < 300 mL  -Continue finasteride 5 mg daily and Cialis 5 mg daily - refills given -Now that he is interested in a bladder outlet procedure we will repeat cystoscopy and schedule TRUS -Reviewed how cystoscopy is performed and what to expect after and also how the TRUS is performed -His questions were answered adequately and he is in agreement and wishes to proceed  3. Erectile dysfunction  -Continue Cialis 5 mg daily  -increase to tadalafil 5 mg, up to 4 tablets for intercourse   4. History of hematuria -Hematuria work up completed in 08/2015 - findings positive for friable enlarged prostate - continue finasteride -No  report of gross hematuria  -UA today negative for micro heme  Return for schedule cysto/TRUS for BOO .  These notes generated with voice recognition software. I apologize for typographical errors.  Cloretta Ned  [facility] [address] [place], [place] [address] [phone]

## 2023-03-05 ENCOUNTER — Ambulatory Visit (INDEPENDENT_AMBULATORY_CARE_PROVIDER_SITE_OTHER): Payer: 59 | Admitting: Urology

## 2023-03-05 ENCOUNTER — Encounter: Payer: Self-pay | Admitting: Urology

## 2023-03-05 VITALS — BP 138/70 | HR 74 | Ht 67.0 in | Wt 150.0 lb

## 2023-03-05 DIAGNOSIS — R319 Hematuria, unspecified: Secondary | ICD-10-CM

## 2023-03-05 DIAGNOSIS — N401 Enlarged prostate with lower urinary tract symptoms: Secondary | ICD-10-CM

## 2023-03-05 DIAGNOSIS — N529 Male erectile dysfunction, unspecified: Secondary | ICD-10-CM

## 2023-03-05 DIAGNOSIS — R3915 Urgency of urination: Secondary | ICD-10-CM

## 2023-03-05 DIAGNOSIS — N138 Other obstructive and reflux uropathy: Secondary | ICD-10-CM

## 2023-03-05 LAB — MICROSCOPIC EXAMINATION

## 2023-03-05 LAB — URINALYSIS, COMPLETE
Bilirubin, UA: NEGATIVE
Glucose, UA: NEGATIVE
Ketones, UA: NEGATIVE
Nitrite, UA: NEGATIVE
Protein,UA: NEGATIVE
RBC, UA: NEGATIVE
Specific Gravity, UA: <1.005 — ABNORMAL LOW (ref 1.005–1.030)
Urobilinogen, Ur: 0.2 mg/dL (ref 0.2–1.0)
pH, UA: 5.5 (ref 5.0–7.5)

## 2023-03-05 LAB — BLADDER SCAN AMB NON-IMAGING: Scan Result: 0

## 2023-03-05 MED ORDER — TADALAFIL 5 MG PO TABS: 5.0000 mg | ORAL_TABLET | Freq: Every day | ORAL | 3 refills | Status: AC | PRN

## 2023-03-05 MED ORDER — FINASTERIDE 5 MG PO TABS: 5.0000 mg | ORAL_TABLET | Freq: Every day | ORAL | 3 refills | Status: DC

## 2023-03-19 ENCOUNTER — Ambulatory Visit (INDEPENDENT_AMBULATORY_CARE_PROVIDER_SITE_OTHER): Payer: 59 | Admitting: Urology

## 2023-03-19 VITALS — BP 168/94 | HR 69 | Ht 67.0 in | Wt 156.2 lb

## 2023-03-19 DIAGNOSIS — N138 Other obstructive and reflux uropathy: Secondary | ICD-10-CM

## 2023-03-19 DIAGNOSIS — N401 Enlarged prostate with lower urinary tract symptoms: Secondary | ICD-10-CM

## 2023-03-19 NOTE — Progress Notes (Unsigned)
03/19/23  Chief Complaint  Patient presents with   cysto/trus     HPI: 67 y.o. year-old male with refractory urinary symptoms related to BPH who presents today to the office for cystoscopy and prostate sizing   Please see previous notes for details.   Notably, back in 2017, he was on Adderall and testosterone at the time.  He was noted to have significant by lobar intravesical protrusion.  Since then he stopped taking these medications and has been on chronic finasteride and Cialis.  Blood pressure (!) 168/94, pulse 69, height 5\' 7"  (1.702 m), weight 156 lb 4 oz (70.9 kg). NED. A&Ox3.   No respiratory distress   Abd soft, NT, ND Normal phallus with bilateral descended testicles    Cystoscopy Procedure Note  Patient identification was confirmed, informed consent was obtained, and patient was prepped using Betadine solution.  Lidocaine jelly was administered per urethral meatus.    Preoperative abx where received prior to procedure.     Pre-Procedure: - Inspection reveals a normal caliber ureteral meatus.  Procedure: The flexible cystoscope was introduced without difficulty - No urethral strictures/lesions are present. - {Blank multiple:19197::"Enlarged","Surgically absent","Normal"} prostate *** - {Blank multiple:19197::"Normal","Elevated","Tight"} bladder neck - Bilateral ureteral orifices identified - Bladder mucosa  reveals no ulcers, tumors, or lesions - No bladder stones - No trabeculation  Retroflexion shows ***   Post-Procedure: - Patient tolerated the procedure well   Prostate transrectal ultrasound sizing   Informed consent was obtained after discussing risks/benefits of the procedure.  A time out was performed to ensure correct patient identity.   Pre-Procedure: -Transrectal probe was placed without difficulty -Transrectal Ultrasound performed revealing a *** gm prostate measuring *** x *** x *** cm (length) -No significant hypoechoic or median lobe  noted      Assessment/ Plan:   Vanna Scotland, MD

## 2023-03-20 ENCOUNTER — Telehealth: Payer: Self-pay | Admitting: Urology

## 2023-03-20 ENCOUNTER — Other Ambulatory Visit: Payer: Self-pay | Admitting: Nurse Practitioner

## 2023-03-20 DIAGNOSIS — I700 Atherosclerosis of aorta: Secondary | ICD-10-CM

## 2023-03-20 DIAGNOSIS — N138 Other obstructive and reflux uropathy: Secondary | ICD-10-CM

## 2023-03-20 DIAGNOSIS — R3915 Urgency of urination: Secondary | ICD-10-CM

## 2023-03-20 NOTE — Telephone Encounter (Signed)
Please let this patient know that I discussed his procedure results with [name].    We agreed that prior to proceeding with a procedure like UroLift, would like you to undergo a procedure called urodynamics which can be performed in [place] to ensure that your urinary issues are truly being caused by your prostate, especially since your prostate is relatively small.    Will have you follow-up after your urodynamics testing with either myself or [name].  Vanna Scotland, MD

## 2023-03-21 ENCOUNTER — Other Ambulatory Visit: Payer: Self-pay | Admitting: Nurse Practitioner

## 2023-03-21 DIAGNOSIS — F17210 Nicotine dependence, cigarettes, uncomplicated: Secondary | ICD-10-CM

## 2023-03-21 NOTE — Telephone Encounter (Signed)
Left message to call back  

## 2023-03-22 NOTE — Telephone Encounter (Signed)
Pt returned your call and I read message about urodynamics.

## 2023-03-22 NOTE — Addendum Note (Signed)
Addended by: Consuella Lose on: 03/22/2023 03:28 PM   Modules accepted: Orders

## 2023-03-22 NOTE — Telephone Encounter (Signed)
Referral placed and follow up appointment made.

## 2023-04-14 ENCOUNTER — Other Ambulatory Visit: Payer: Self-pay | Admitting: Nurse Practitioner

## 2023-04-14 DIAGNOSIS — E559 Vitamin D deficiency, unspecified: Secondary | ICD-10-CM

## 2023-04-25 DIAGNOSIS — R350 Frequency of micturition: Secondary | ICD-10-CM | POA: Diagnosis not present

## 2023-05-01 ENCOUNTER — Ambulatory Visit (INDEPENDENT_AMBULATORY_CARE_PROVIDER_SITE_OTHER): Payer: 59 | Admitting: Urology

## 2023-05-01 VITALS — BP 125/79 | HR 80 | Ht 68.0 in | Wt 156.1 lb

## 2023-05-01 DIAGNOSIS — N401 Enlarged prostate with lower urinary tract symptoms: Secondary | ICD-10-CM

## 2023-05-01 DIAGNOSIS — R3912 Poor urinary stream: Secondary | ICD-10-CM | POA: Diagnosis not present

## 2023-05-01 DIAGNOSIS — R319 Hematuria, unspecified: Secondary | ICD-10-CM | POA: Diagnosis not present

## 2023-05-01 DIAGNOSIS — R3915 Urgency of urination: Secondary | ICD-10-CM

## 2023-05-01 NOTE — Progress Notes (Signed)
Marcelle Overlie Plume,acting as a scribe for Vanna Scotland, MD.,have documented all relevant documentation on the behalf of Vanna Scotland, MD,as directed by  Vanna Scotland, MD while in the presence of Vanna Scotland, MD.  05/01/2023 4:14 PM   Alex Bowers 09-08-1955 [id number]  Referring provider: Sallyanne Kuster, NP [address] [place],  [place] [address]  Chief Complaint  Patient presents with   Results    HPI: 67 year-old male with a personal history of BPH.   He is followed by [name] with urinary symptoms primarily irritative in nature. At his last visit, he was having a weaker urinary stream on finasteride and tadalafil. He was referred to me for cystoscopy/ TRUS or for the evaluation of his prostate anatomy. He did have some slight intrusion in the lateral lobe, but no discrete median lobe or elevated bladder neck. His prostate volume was only 15 grams.  Due to his somewhat unusual urinary symptoms, he was referred for urodynamics.   He had a max capacity of 499 mL's. His first sensation was at 209 mL's. He did have instability, including an unstable contraction, with urgency but did not leak during that contraction. He did generate a voluntary contraction and voided 87 cc's with a max flow of only 8 mL per second, with a max detrusor pressure while voiding of 46. He had an obstructive flow pattern. He did have some mild intermittent EMG activity during voiding. His PVR was 412. He had no reflux on the fluoroscopy. Mild elevated bladder neck base. He has been interested in UroLift.   PMH: Past Medical History:  Diagnosis Date   Dysplastic nevus 07/08/2014   Right proximal lateral deltoid. Sever atypia, lateral margin involved. Excised 09/07/2014, margins free.   Dysplastic nevus 11/16/2014   Right distal post. deltoid. Melanocytic hyperplasia and incidental dysplastic nevus with mild atypia, margins free. Excised.   Dysplastic nevus 07/03/2021   L lat  buttocks - moderate   History of dysplastic nevus 05/18/2020   left anterior deltoid, severe / shave removal   Hyperlipidemia    Hypertension    Personal history of tobacco use, presenting hazards to health 12/29/2015   Stroke [facility]) 2014    Surgical History: Past Surgical History:  Procedure Laterality Date   COLONOSCOPY  2015   HERNIA REPAIR Right 06/09/14   inguinal    teeth removal       Home Medications:  Allergies as of 05/01/2023   No Known Allergies      Medication List        Accurate as of May 01, 2023  4:14 PM. If you have any questions, ask your nurse or doctor.          amLODipine 5 MG tablet Commonly known as: NORVASC TAKE 1 TABLET (5 MG TOTAL) BY MOUTH DAILY.   aspirin EC 81 MG tablet Take 81 mg by mouth daily.   buPROPion 300 MG 24 hr tablet Commonly known as: WELLBUTRIN XL TAKE 1 TABLET BY MOUTH EVERY DAY   citalopram 40 MG tablet Commonly known as: CELEXA Take 20 mg by mouth daily.   finasteride 5 MG tablet Commonly known as: PROSCAR Take 1 tablet (5 mg total) by mouth daily.   lisinopril 40 MG tablet Commonly known as: ZESTRIL TAKE 1 TABLET BY MOUTH EVERY DAY   meloxicam 15 MG tablet Commonly known as: MOBIC TAKE 1 TABLET (15 MG TOTAL) BY MOUTH DAILY.   methocarbamol 500 MG tablet Commonly known as: ROBAXIN TAKE 1  TABLET BY MOUTH THREE TIMES A DAY AS NEEDED FOR MUSCLE SPASM   nicotine polacrilex 4 MG lozenge Commonly known as: COMMIT SMARTSIG:1 Lozenge(s) By Mouth Every 6-8 Hours PRN   rosuvastatin 10 MG tablet Commonly known as: CRESTOR TAKE 1 TABLET (10 MG TOTAL) BY MOUTH DAILY. FOR HIGH CHOLESTEROL   tadalafil 5 MG tablet Commonly known as: CIALIS Take 1 tablet (5 mg total) by mouth daily as needed for erectile dysfunction.   Vitamin D (Ergocalciferol) 1.25 MG (50000 UNIT) Caps capsule Commonly known as: DRISDOL TAKE 1 CAPSULE (50,000 UNITS TOTAL) BY MOUTH EVERY 7 (SEVEN) DAYS        Family History: Family  History  Problem Relation Age of Onset   Colon polyps Mother    Heart attack Father    Cancer - Other Brother        phageal   Prostate cancer Neg Hx    Kidney cancer Neg Hx    Bladder Cancer Neg Hx     Social History:  reports that he has been smoking pipe. He started smoking about 14 months ago. He has never used smokeless tobacco. He reports current alcohol use. He reports that he does not use drugs.   Physical Exam: BP 125/79   Pulse 80   Ht 5\' 8"  (1.727 m)   Wt 156 lb 2 oz (70.8 kg)   BMI 23.74 kg/m   Constitutional:  Alert and oriented, No acute distress. HEENT: [place] AT, moist mucus membranes.  Trachea midline, no masses. Neurologic: Grossly intact, no focal deficits, moving all 4 extremities. Psychiatric: Normal mood and affect.  Assessment & Plan:    1. BPH - He has a small prostate volume but significant urinary symptoms. - Urodynamics showed obstructive flow pattern and mild intermittent EMG activity during voiding, suggesting possible pelvic floor dysfunction. - Discussed the option of UroLift, which is a minimally invasive procedure with a 50-50 chance of improving symptoms. - Alternative option includes physical therapy to address potential pelvic floor dysfunction. - He prefers to move forward with a UroLift. - Ensure no contraindications for UroLift procedure. -Extensive discussion of risk including bleeding, retention, pain, implant complication/ migration/ stone formation, and most importantly risk of no improvement in urinary symptoms reviewed in detail  2. Urinary urgency frequency - Symptoms may be due to a combination of BPH and pelvic floor dysfunction. - Pre-op urine sample to be collected today.   Return for UroLift.  I have reviewed the above documentation for accuracy and completeness, and I agree with the above.   Vanna Scotland, MD   [facility] [address] [place], [place] [address] [phone]

## 2023-05-01 NOTE — Patient Instructions (Signed)
Prostatic Urethral Lift  Prostatic urethral lift is a surgical procedure to treat symptoms of prostate gland enlargement that occurs with age (benign prostatic hypertrophy, BPH). The urethra passes between the two lobes of the prostate. The urethra is the part of the body that drains urine from the bladder. As the prostate enlarges, it can push on the urethra and cause problems with urinating. This procedure involves placing an implant that holds the prostate away from the urethra. The procedure is done using a thin device called a cystoscope. The device is inserted through the tip of the penis and moved up the urethra to the prostate. This is less invasive than other procedures that require an incision. You may have this procedure if: You have symptoms of BPH. Your prostate is not severely enlarged. Medicines to treat BPH are not working or not tolerated. You want to avoid possible sexual side effects from medicines or other procedures that are used to treat BPH. Tell a health care provider about: Any allergies you have. All medicines you are taking, including vitamins, herbs, eye drops, creams, and over-the-counter medicines. Any problems you or family members have had with anesthetic medicines. Any bleeding problems you have. Any surgeries you have had. Any medical conditions you have. What are the risks? Generally, this is a safe procedure. However, problems may occur, including: Bleeding. Infection. Leaking of urine (incontinence). Allergic reactions to medicines. Return of BPH symptoms after 2 years, requiring more treatment. What happens before the procedure? When to stop eating and drinking Follow instructions from your health care provider about what you may eat and drink before your procedure. These may include: 8 hours before your procedure Stop eating most foods. Do not eat meat, fried foods, or fatty foods. Eat only light foods, such as toast or crackers. All liquids are  okay except energy drinks and alcohol. 6 hours before your procedure Stop eating. Drink only clear liquids, such as water, clear fruit juice, black coffee, plain tea, and sports drinks. Do not drink energy drinks or alcohol. 2 hours before your procedure Stop drinking all liquids. You may be allowed to take medicines with small sips of water. If you do not follow your health care provider's instructions, your procedure may be delayed or canceled. Medicines Ask your health care provider about: Changing or stopping your regular medicines. This is especially important if you are taking diabetes medicines or blood thinners. Taking medicines such as aspirin and ibuprofen. These medicines can thin your blood. Do not take these medicines unless your health care provider tells you to take them. Taking over-the-counter medicines, vitamins, herbs, and supplements. Surgery safety Ask your health care provider what steps will be taken to help prevent infection. These steps may include: Removing hair at the surgery site. Washing skin with a germ-killing soap. Taking antibiotic medicine. General instructions Do not use any products that contain nicotine or tobacco for at least 4 weeks before the procedure. These products include cigarettes, chewing tobacco, and vaping devices, such as e-cigarettes. If you need help quitting, ask your health care provider. If you will be going home right after the procedure, plan to have a responsible adult: Take you home from the hospital or clinic. You will not be allowed to drive. Care for you for the time you are told. What happens during the procedure? An IV may be inserted into one of your veins. You will be given one or more of the following: A medicine to help you relax (sedative). A medicine that  is injected into your urethra to numb the area (local anesthetic). A medicine to make you fall asleep (general anesthetic). A cystoscope will be inserted into your  penis and moved through your urethra to your prostate. A device will be inserted through the cystoscope and used to press the lobes of your prostate away from your urethra. Implants will be inserted through the device to hold the lobes of your prostate in the widened position. The device and cystoscope will be removed. The procedure may vary among health care providers and hospitals. What happens after the procedure? Your blood pressure, heart rate, breathing rate, and blood oxygen level be monitored until you leave the hospital or clinic. If you were given a sedative during the procedure, it can affect you for several hours. Do not drive or operate machinery until your health care provider says that it is safe. Summary Prostatic urethral lift is a surgical procedure to relieve symptoms of prostate gland enlargement that occurs with age (benign prostatic hypertrophy, BPH). The procedure is performed with a thin device called a cystoscope. This device is inserted through the tip of the penis and moved up the urethra to reach the prostate. This is less invasive than other procedures that require an incision. If you will be going home right after the procedure, plan to have a responsible adult take you home from the hospital or clinic. You will not be allowed to drive. This information is not intended to replace advice given to you by your health care provider. Make sure you discuss any questions you have with your health care provider. Document Revised: 03/03/2021 Document Reviewed: 03/03/2021 Elsevier Patient Education  2024 [employer].

## 2023-05-02 ENCOUNTER — Ambulatory Visit (INDEPENDENT_AMBULATORY_CARE_PROVIDER_SITE_OTHER): Payer: 59 | Admitting: Nurse Practitioner

## 2023-05-02 ENCOUNTER — Encounter: Payer: Self-pay | Admitting: Nurse Practitioner

## 2023-05-02 VITALS — BP 130/90 | HR 67 | Temp 98.6°F | Resp 16 | Ht 68.0 in | Wt 156.6 lb

## 2023-05-02 DIAGNOSIS — E538 Deficiency of other specified B group vitamins: Secondary | ICD-10-CM | POA: Diagnosis not present

## 2023-05-02 DIAGNOSIS — R300 Dysuria: Secondary | ICD-10-CM | POA: Diagnosis not present

## 2023-05-02 DIAGNOSIS — D692 Other nonthrombocytopenic purpura: Secondary | ICD-10-CM | POA: Diagnosis not present

## 2023-05-02 DIAGNOSIS — I700 Atherosclerosis of aorta: Secondary | ICD-10-CM | POA: Diagnosis not present

## 2023-05-02 DIAGNOSIS — I10 Essential (primary) hypertension: Secondary | ICD-10-CM

## 2023-05-02 DIAGNOSIS — E559 Vitamin D deficiency, unspecified: Secondary | ICD-10-CM | POA: Diagnosis not present

## 2023-05-02 DIAGNOSIS — Z0001 Encounter for general adult medical examination with abnormal findings: Secondary | ICD-10-CM | POA: Diagnosis not present

## 2023-05-02 LAB — URINALYSIS, COMPLETE
Bilirubin, UA: NEGATIVE
Glucose, UA: NEGATIVE
Ketones, UA: NEGATIVE
Nitrite, UA: NEGATIVE
Protein,UA: NEGATIVE
RBC, UA: NEGATIVE
Specific Gravity, UA: 1.01 (ref 1.005–1.030)
Urobilinogen, Ur: 0.2 mg/dL (ref 0.2–1.0)
pH, UA: 5 (ref 5.0–7.5)

## 2023-05-02 LAB — MICROSCOPIC EXAMINATION

## 2023-05-02 MED ORDER — AMLODIPINE BESYLATE 5 MG PO TABS: 5.0000 mg | ORAL_TABLET | Freq: Every day | ORAL | 1 refills | Status: DC

## 2023-05-02 MED ORDER — LISINOPRIL 40 MG PO TABS
40.0000 mg | ORAL_TABLET | Freq: Every day | ORAL | 2 refills | Status: DC
Start: 2023-05-02 — End: 2023-05-24

## 2023-05-02 NOTE — Progress Notes (Signed)
[facility] [address] [place], [place] [address]  Internal MEDICINE  Office Visit Note  Patient Name: Alex Bowers  [id number]  [id number]  Date of Service: 05/02/2023  Chief Complaint  Patient presents with   Hyperlipidemia   Hypertension   Annual Exam    HPI Alex Bowers presents for an annual well visit and physical exam.  Well-appearing 67 y.o. male with hypertension, aortic atherosclerosis, atopic dermatitis, ADHD, major depressive disorder and current nicotine use.  Routine CRC screening: due in October next year.  Labs: due for routine labs . New or worsening pain: none  Other concerns: sees urology for weak urine stream.       05/02/2023    2:06 PM  MMSE - Mini Mental State Exam  Orientation to time 5  Orientation to Place 5  Registration 3  Attention/ Calculation 5  Recall 3  Language- name 2 objects 2  Language- repeat 1  Language- follow 3 step command 3  Language- read & follow direction 1  Write a sentence 1  Copy design 1  Total score 30    Functional Status Survey: Is the patient deaf or have difficulty hearing?: No Does the patient have difficulty seeing, even when wearing glasses/contacts?: No Does the patient have difficulty concentrating, remembering, or making decisions?: No Does the patient have difficulty walking or climbing stairs?: No Does the patient have difficulty dressing or bathing?: No Does the patient have difficulty doing errands alone such as visiting a doctor's office or shopping?: No     09/25/2021    2:55 PM 02/15/2022    3:36 PM 04/26/2022    2:09 PM 10/25/2022    2:41 PM 05/02/2023    2:05 PM  Fall Risk  Falls in the past year? 0 0 0 0 0  Was there an injury with Fall?    0 0  Fall Risk Category Calculator    0 0  (RETIRED) Patient Fall Risk Level Low fall risk  Low fall risk    Patient at Risk for Falls Due to No Fall Risks  No Fall Risks No Fall Risks No Fall Risks  Fall risk Follow up Falls  evaluation completed  Falls evaluation completed Falls evaluation completed Falls evaluation completed       05/02/2023    2:05 PM  Depression screen PHQ 2/9  Decreased Interest 0  Down, Depressed, Hopeless 0  PHQ - 2 Score 0        No data to display            Current Medication: Outpatient Encounter Medications as of 05/02/2023  Medication Sig   aspirin EC 81 MG tablet Take 81 mg by mouth daily.   buPROPion (WELLBUTRIN XL) 300 MG 24 hr tablet TAKE 1 TABLET BY MOUTH EVERY DAY   finasteride (PROSCAR) 5 MG tablet Take 1 tablet (5 mg total) by mouth daily.   meloxicam (MOBIC) 15 MG tablet TAKE 1 TABLET (15 MG TOTAL) BY MOUTH DAILY.   methocarbamol (ROBAXIN) 500 MG tablet TAKE 1 TABLET BY MOUTH THREE TIMES A DAY AS NEEDED FOR MUSCLE SPASM   nicotine polacrilex (COMMIT) 4 MG lozenge SMARTSIG:1 Lozenge(s) By Mouth Every 6-8 Hours PRN   rosuvastatin (CRESTOR) 10 MG tablet TAKE 1 TABLET (10 MG TOTAL) BY MOUTH DAILY. FOR HIGH CHOLESTEROL   tadalafil (CIALIS) 5 MG tablet Take 1 tablet (5 mg total) by mouth daily as needed for erectile dysfunction.   Vitamin D, Ergocalciferol, (DRISDOL) 1.25 MG (50000 UNIT)  CAPS capsule TAKE 1 CAPSULE (50,000 UNITS TOTAL) BY MOUTH EVERY 7 (SEVEN) DAYS   [DISCONTINUED] amLODipine (NORVASC) 5 MG tablet TAKE 1 TABLET (5 MG TOTAL) BY MOUTH DAILY.   [DISCONTINUED] citalopram (CELEXA) 40 MG tablet Take 20 mg by mouth daily.    [DISCONTINUED] lisinopril (ZESTRIL) 40 MG tablet TAKE 1 TABLET BY MOUTH EVERY DAY   amLODipine (NORVASC) 5 MG tablet Take 1 tablet (5 mg total) by mouth daily.   lisinopril (ZESTRIL) 40 MG tablet Take 1 tablet (40 mg total) by mouth daily.   No facility-administered encounter medications on file as of 05/02/2023.    Surgical History: Past Surgical History:  Procedure Laterality Date   COLONOSCOPY  2015   HERNIA REPAIR Right 06/09/14   inguinal    teeth removal       Medical History: Past Medical History:  Diagnosis Date    Dysplastic nevus 07/08/2014   Right proximal lateral deltoid. Sever atypia, lateral margin involved. Excised 09/07/2014, margins free.   Dysplastic nevus 11/16/2014   Right distal post. deltoid. Melanocytic hyperplasia and incidental dysplastic nevus with mild atypia, margins free. Excised.   Dysplastic nevus 07/03/2021   L lat buttocks - moderate   History of dysplastic nevus 05/18/2020   left anterior deltoid, severe / shave removal   Hyperlipidemia    Hypertension    Personal history of tobacco use, presenting hazards to health 12/29/2015   Stroke (HCC) 2014    Family History: Family History  Problem Relation Age of Onset   Colon polyps Mother    Heart attack Father    Cancer - Other Brother        phageal   Prostate cancer Neg Hx    Kidney cancer Neg Hx    Bladder Cancer Neg Hx     Social History   Socioeconomic History   Marital status: Divorced    Spouse name: Not on file   Number of children: Not on file   Years of education: Not on file   Highest education level: Not on file  Occupational History   Not on file  Tobacco Use   Smoking status: Every Day    Types: Pipe    Start date: 02/19/2022   Smokeless tobacco: Never   Tobacco comments:    2 packs daily, trying to cut down to 1 pack/vaping No more smoking only vaping now 04/26/22  Vaping Use   Vaping status: Every Day  Substance and Sexual Activity   Alcohol use: Yes    Comment: occasionally   Drug use: No   Sexual activity: Not on file  Other Topics Concern   Not on file  Social History Narrative   Not on file   Social Determinants of Health   Financial Resource Strain: Not on file  Food Insecurity: Not on file  Transportation Needs: Not on file  Physical Activity: Not on file  Stress: Not on file  Social Connections: Not on file  Intimate Partner Violence: Not on file      Review of Systems  Constitutional:  Negative for activity change, appetite change, chills, fatigue, fever and unexpected  weight change.  HENT: Negative.  Negative for congestion, ear pain, rhinorrhea, sore throat and trouble swallowing.   Eyes: Negative.   Respiratory: Negative.  Negative for cough, chest tightness, shortness of breath and wheezing.   Cardiovascular: Negative.  Negative for chest pain and palpitations.  Gastrointestinal: Negative.  Negative for abdominal pain, blood in stool, constipation, diarrhea, nausea and vomiting.  Endocrine: Negative.   Genitourinary: Negative.  Negative for difficulty urinating, dysuria, frequency, hematuria and urgency.  Musculoskeletal: Negative.  Negative for arthralgias, back pain, joint swelling, myalgias and neck pain.  Skin: Negative.  Negative for rash and wound.  Allergic/Immunologic: Negative.  Negative for immunocompromised state.  Neurological: Negative.  Negative for dizziness, seizures, numbness and headaches.  Hematological: Negative.   Psychiatric/Behavioral: Negative.  Negative for behavioral problems, self-injury and suicidal ideas. The patient is not nervous/anxious.     Vital Signs: BP (!) 130/90   Pulse 67   Temp 98.6 F (37 C)   Resp 16   Ht 5\' 8"  (1.727 m)   Wt 156 lb 9.6 oz (71 kg)   SpO2 98%   BMI 23.81 kg/m    Physical Exam Vitals reviewed.  Constitutional:      General: He is awake. He is not in acute distress.    Appearance: Normal appearance. He is well-developed, well-groomed and normal weight. He is not ill-appearing or diaphoretic.  HENT:     Head: Normocephalic and atraumatic.     Right Ear: Tympanic membrane, ear canal and external ear normal.     Left Ear: Tympanic membrane, ear canal and external ear normal.     Nose: Nose normal. No congestion or rhinorrhea.     Mouth/Throat:     Lips: Pink.     Mouth: Mucous membranes are moist.     Pharynx: Oropharynx is clear. Uvula midline. No oropharyngeal exudate.  Eyes:     General: Lids are normal. Vision grossly intact. Gaze aligned appropriately. No scleral icterus.        Right eye: No discharge.        Left eye: No discharge.     Extraocular Movements: Extraocular movements intact.     Conjunctiva/sclera: Conjunctivae normal.     Pupils: Pupils are equal, round, and reactive to light.     Funduscopic exam:    Right eye: Red reflex present.        Left eye: Red reflex present. Neck:     Thyroid: No thyromegaly.     Vascular: No carotid bruit or JVD.     Trachea: Trachea and phonation normal. No tracheal deviation.  Cardiovascular:     Rate and Rhythm: Normal rate and regular rhythm.     Pulses:          Carotid pulses are 3+ on the right side and 3+ on the left side.      Radial pulses are 2+ on the right side and 2+ on the left side.       Posterior tibial pulses are 2+ on the right side and 2+ on the left side.     Heart sounds: Normal heart sounds, S1 normal and S2 normal. No murmur heard.    No friction rub. No gallop.  Pulmonary:     Effort: Pulmonary effort is normal. No respiratory distress.     Breath sounds: Normal breath sounds. No stridor. No wheezing or rales.  Chest:     Chest wall: No tenderness.  Abdominal:     General: Bowel sounds are normal. There is no distension.     Palpations: Abdomen is soft. There is no mass.     Tenderness: There is no abdominal tenderness. There is no guarding or rebound.  Musculoskeletal:        General: No deformity.     Cervical back: Neck supple. No tenderness.     Right lower leg: No edema.  Left lower leg: No edema.  Lymphadenopathy:     Cervical: No cervical adenopathy.  Skin:    General: Skin is warm and dry.     Capillary Refill: Capillary refill takes less than 2 seconds.     Coloration: Skin is not pale.     Findings: No erythema or rash.  Neurological:     Mental Status: He is alert and oriented to person, place, and time.     Cranial Nerves: No cranial nerve deficit.     Motor: No abnormal muscle tone.     Coordination: Coordination normal.     Gait: Gait normal.     Deep  Tendon Reflexes: Reflexes are normal and symmetric.  Psychiatric:        Mood and Affect: Mood normal.        Behavior: Behavior normal. Behavior is cooperative.        Thought Content: Thought content normal.        Judgment: Judgment normal.        Assessment/Plan: 1. Encounter for routine adult health examination with abnormal findings Age-appropriate preventive screenings and vaccinations discussed, annual physical exam completed. Routine labs for health maintenance ordered, see below. PHM updated.  - CBC with Differential/Platelet - CMP14+EGFR - Lipid Profile - Vitamin D (25 hydroxy) - B12 and Folate Panel  2. Essential hypertension Stable, continue lisinopril and amlodipine as prescribed.  - lisinopril (ZESTRIL) 40 MG tablet; Take 1 tablet (40 mg total) by mouth daily.  Dispense: 90 tablet; Refill: 2 - amLODipine (NORVASC) 5 MG tablet; Take 1 tablet (5 mg total) by mouth daily.  Dispense: 90 tablet; Refill: 1  3. Atherosclerosis of aorta (HCC) Routine labs ordered  - CBC with Differential/Platelet - CMP14+EGFR - Lipid Profile  4. Senile purpura (HCC) Routine labs ordered  - CBC with Differential/Platelet - B12 and Folate Panel  5. B12 deficiency Routine lab ordered  - B12 and Folate Panel  6. Vitamin D deficiency Routine lab ordered  - Vitamin D (25 hydroxy)  7. Dysuria Routine urinalysis done  - UA/M w/rflx Culture, Routine - Microscopic Examination - Urine Culture, Reflex      General Counseling: Alex Bowers verbalizes understanding of the findings of todays visit and agrees with plan of treatment. I have discussed any further diagnostic evaluation that may be needed or ordered today. We also reviewed his medications today. he has been encouraged to call the office with any questions or concerns that should arise related to todays visit.    Orders Placed This Encounter  Procedures   CBC with Differential/Platelet   CMP14+EGFR   Lipid Profile    Vitamin D (25 hydroxy)   B12 and Folate Panel    Meds ordered this encounter  Medications   lisinopril (ZESTRIL) 40 MG tablet    Sig: Take 1 tablet (40 mg total) by mouth daily.    Dispense:  90 tablet    Refill:  2   amLODipine (NORVASC) 5 MG tablet    Sig: Take 1 tablet (5 mg total) by mouth daily.    Dispense:  90 tablet    Refill:  1    Return in about 6 months (around 10/30/2023) for F/U, Alex Bowers PCP.   Total time spent:30 Minutes Time spent includes review of chart, medications, test results, and follow up plan with the patient.   [place] Controlled Substance Database was reviewed by me.  This patient was seen by Sallyanne Kuster, FNP-C in collaboration with Dr. Beverely Risen as a part  of collaborative care agreement.  Joreen Swearingin R. Tedd Sias, MSN, FNP-C Internal medicine

## 2023-05-06 ENCOUNTER — Other Ambulatory Visit: Payer: Self-pay

## 2023-05-06 DIAGNOSIS — N138 Other obstructive and reflux uropathy: Secondary | ICD-10-CM

## 2023-05-06 LAB — UA/M W/RFLX CULTURE, ROUTINE
Bilirubin, UA: NEGATIVE
Glucose, UA: NEGATIVE
Ketones, UA: NEGATIVE
Nitrite, UA: NEGATIVE
Protein,UA: NEGATIVE
RBC, UA: NEGATIVE
Specific Gravity, UA: 1.015 (ref 1.005–1.030)
Urobilinogen, Ur: 0.2 mg/dL (ref 0.2–1.0)
pH, UA: 6 (ref 5.0–7.5)

## 2023-05-06 LAB — MICROSCOPIC EXAMINATION
Bacteria, UA: NONE SEEN
Casts: NONE SEEN /LPF
Epithelial Cells (non renal): NONE SEEN /HPF (ref 0–10)
RBC, Urine: NONE SEEN /HPF (ref 0–2)

## 2023-05-06 LAB — URINE CULTURE, REFLEX

## 2023-05-06 NOTE — Progress Notes (Unsigned)
Surgical Physician Order Form [facility]  Dr. Vanna Scotland, MD  * Scheduling expectation : Next Available  *Length of Case:   *Clearance needed: no  *Anticoagulation Instructions: N/A  *Aspirin Instructions: Hold Aspirin  *Post-op visit Date/Instructions:  4-6 week w/PVR  *Diagnosis: BPH w/urinary obstruction  *Procedure: Urolift   Additional orders: N/A  -Admit type: OUTpatient  -Anesthesia: MAC  -VTE Prophylaxis Standing Order SCD's       Other:   -Standing Lab Orders Per Anesthesia    Lab other: None  -Standing Test orders EKG/Chest x-ray per Anesthesia       Test other:   - Medications:  Ancef 2gm IV  -Other orders:  Rep wants to be aware

## 2023-05-08 LAB — CULTURE, URINE COMPREHENSIVE

## 2023-05-09 ENCOUNTER — Other Ambulatory Visit: Payer: Self-pay

## 2023-05-09 ENCOUNTER — Telehealth: Payer: Self-pay

## 2023-05-09 MED ORDER — SULFAMETHOXAZOLE-TRIMETHOPRIM 800-160 MG PO TABS: 1.0000 | ORAL_TABLET | Freq: Two times a day (BID) | ORAL | 0 refills | Status: DC

## 2023-05-09 NOTE — Telephone Encounter (Signed)
Per Dr. Apolinar Junes, Patient is to be scheduled for Cystoscopy with Insertion of Urolift   Alex Bowers was contacted and possible surgical dates were discussed, Monday October 14th, 2024 was agreed upon for surgery.   Patient was directed to call [phone] between 1-3pm the day before surgery to find out surgical arrival time.  Instructions were given not to eat or drink from midnight on the night before surgery and have a driver for the day of surgery. On the surgery day patient was instructed to enter through the Medical Mall entrance of [facility] report the Same Day Surgery desk.   Pre-Admit Testing will be in contact via phone to set up an interview with the anesthesia team to review your history and medications prior to surgery.   Reminder of this information was sent via MAILED to the patient.

## 2023-05-09 NOTE — Progress Notes (Signed)
   [facility] Surgical Posting Form  Surgery Date: Date: 06/03/2023  Surgeon: Dr. Vanna Scotland, MD  Inpt ( No  )   Outpt (Yes)   Obs ( No  )   Diagnosis: N40.1, N13.8 Benign Prostatic Hyperplasia with Urinary Obstruction  -CPT: 52441, [phone]  Surgery: Cystoscopy with Insertion of Urolift  Stop Anticoagulations: Yes and hold ASA  Cardiac/Medical/Pulmonary Clearance needed: no  *Orders entered into EPIC  Date: 05/09/23   *Case booked in EPIC  Date: 05/09/23  *Notified pt of Surgery: Date: 05/06/2023  PRE-OP UA & CX: no  *Placed into Prior Authorization Work Que Date: 05/09/23  Assistant/laser/rep:No

## 2023-05-10 DIAGNOSIS — Z0001 Encounter for general adult medical examination with abnormal findings: Secondary | ICD-10-CM | POA: Diagnosis not present

## 2023-05-10 DIAGNOSIS — E559 Vitamin D deficiency, unspecified: Secondary | ICD-10-CM | POA: Diagnosis not present

## 2023-05-10 DIAGNOSIS — E538 Deficiency of other specified B group vitamins: Secondary | ICD-10-CM | POA: Diagnosis not present

## 2023-05-10 DIAGNOSIS — I700 Atherosclerosis of aorta: Secondary | ICD-10-CM | POA: Diagnosis not present

## 2023-05-11 LAB — CBC WITH DIFFERENTIAL/PLATELET
Basophils Absolute: 0 10*3/uL (ref 0.0–0.2)
Basos: 1 %
EOS (ABSOLUTE): 0.1 10*3/uL (ref 0.0–0.4)
Eos: 1 %
Hematocrit: 41.4 % (ref 37.5–51.0)
Hemoglobin: 13.7 g/dL (ref 13.0–17.7)
Immature Grans (Abs): 0.1 10*3/uL (ref 0.0–0.1)
Immature Granulocytes: 1 %
Lymphocytes Absolute: 1.7 10*3/uL (ref 0.7–3.1)
Lymphs: 20 %
MCH: 33.1 pg — ABNORMAL HIGH (ref 26.6–33.0)
MCHC: 33.1 g/dL (ref 31.5–35.7)
MCV: 100 fL — ABNORMAL HIGH (ref 79–97)
Monocytes Absolute: 0.9 10*3/uL (ref 0.1–0.9)
Monocytes: 11 %
Neutrophils Absolute: 5.8 10*3/uL (ref 1.4–7.0)
Neutrophils: 66 %
Platelets: 284 10*3/uL (ref 150–450)
RBC: 4.14 x10E6/uL (ref 4.14–5.80)
RDW: 12.5 % (ref 11.6–15.4)
WBC: 8.7 10*3/uL (ref 3.4–10.8)

## 2023-05-11 LAB — CMP14+EGFR
ALT: 22 IU/L (ref 0–44)
AST: 21 IU/L (ref 0–40)
Albumin: 4.6 g/dL (ref 3.9–4.9)
Alkaline Phosphatase: 65 IU/L (ref 44–121)
BUN/Creatinine Ratio: 9 — ABNORMAL LOW (ref 10–24)
BUN: 9 mg/dL (ref 8–27)
Bilirubin Total: 0.6 mg/dL (ref 0.0–1.2)
CO2: 23 mmol/L (ref 20–29)
Calcium: 9.6 mg/dL (ref 8.6–10.2)
Chloride: 98 mmol/L (ref 96–106)
Creatinine, Ser: 0.98 mg/dL (ref 0.76–1.27)
Globulin, Total: 2 g/dL (ref 1.5–4.5)
Glucose: 107 mg/dL — ABNORMAL HIGH (ref 70–99)
Potassium: 5.9 mmol/L — ABNORMAL HIGH (ref 3.5–5.2)
Sodium: 134 mmol/L (ref 134–144)
Total Protein: 6.6 g/dL (ref 6.0–8.5)
eGFR: 85 mL/min/{1.73_m2} (ref >59)

## 2023-05-11 LAB — B12 AND FOLATE PANEL
Folate: 5.8 ng/mL (ref >3.0)
Vitamin B-12: 527 pg/mL (ref 232–1245)

## 2023-05-11 LAB — LIPID PANEL
Chol/HDL Ratio: 2 ratio (ref 0.0–5.0)
Cholesterol, Total: 141 mg/dL (ref 100–199)
HDL: 72 mg/dL (ref >39)
LDL Chol Calc (NIH): 58 mg/dL (ref 0–99)
Triglycerides: 51 mg/dL (ref 0–149)
VLDL Cholesterol Cal: 11 mg/dL (ref 5–40)

## 2023-05-11 LAB — VITAMIN D 25 HYDROXY (VIT D DEFICIENCY, FRACTURES): Vit D, 25-Hydroxy: 78.9 ng/mL (ref 30.0–100.0)

## 2023-05-18 ENCOUNTER — Encounter: Payer: Self-pay | Admitting: Nurse Practitioner

## 2023-05-24 ENCOUNTER — Encounter
Admission: RE | Admit: 2023-05-24 | Discharge: 2023-05-24 | Disposition: A | Discharge status: Home / Self Care | Payer: 59 | Source: Ambulatory Visit | Attending: Urology | Admitting: Urology

## 2023-05-24 ENCOUNTER — Other Ambulatory Visit: Payer: Self-pay

## 2023-05-24 DIAGNOSIS — I10 Essential (primary) hypertension: Secondary | ICD-10-CM

## 2023-05-24 DIAGNOSIS — Z01812 Encounter for preprocedural laboratory examination: Secondary | ICD-10-CM

## 2023-05-24 HISTORY — DX: Dyspnea, unspecified: R06.00

## 2023-05-24 HISTORY — DX: Other specified disorders of muscle: M62.89

## 2023-05-24 HISTORY — DX: Headache, unspecified: R51.9

## 2023-05-24 HISTORY — DX: Depression, unspecified: F32.A

## 2023-05-24 HISTORY — DX: Benign prostatic hyperplasia without lower urinary tract symptoms: N40.0

## 2023-05-24 NOTE — Patient Instructions (Addendum)
Your procedure is scheduled on: 06/03/23 - Monday Report to the Registration Desk on the 1st floor of the Medical Mall. To find out your arrival time, please call [phone] between 1PM - 3PM on: 05/31/23 - Friday If your arrival time is 6:00 am, do not arrive before that time as the Medical Mall entrance doors do not open until 6:00 am.  REMEMBER: Instructions that are not followed completely may result in serious medical risk, up to and including death; or upon the discretion of your surgeon and anesthesiologist your surgery may need to be rescheduled.  Do not eat food or drink any liquids after midnight the night before surgery.  No gum chewing or hard candies.  One week prior to surgery: Stop Anti-inflammatories (NSAIDS) such as Advil, Aleve, Ibuprofen, Motrin, Naproxen, Naprosyn and Aspirin based products such as Excedrin, Goody's Powder, BC Powder. You may however, continue to take Tylenol if needed for pain up until the day of surgery.  Stop ANY OVER THE COUNTER supplements until after surgery.  Continue taking all prescribed medications with the exception of the following:  tadalafil (CIALIS) hold beginning 06/01/23. meloxicam (MOBIC) stop taking on 05/27/23.    TAKE ONLY THESE MEDICATIONS THE MORNING OF SURGERY WITH A SIP OF WATER:  amLODipine (NORVASC)  buPROPion (WELLBUTRIN XL)  finasteride (PROSCAR)  sulfamethoxazole-trimethoprim   No Alcohol for 24 hours before or after surgery.  No Smoking including e-cigarettes for 24 hours before surgery.  No chewable tobacco products for at least 6 hours before surgery.  No nicotine patches on the day of surgery.  Do not use any "recreational" drugs for at least a week (preferably 2 weeks) before your surgery.  Please be advised that the combination of cocaine and anesthesia may have negative outcomes, up to and including death. If you test positive for cocaine, your surgery will be cancelled.  On the morning of surgery  brush your teeth with toothpaste and water, you may rinse your mouth with mouthwash if you wish. Do not swallow any toothpaste or mouthwash.  Do not wear jewelry, make-up, hairpins, clips or nail polish.  For welded (permanent) jewelry: bracelets, anklets, waist bands, etc.  Please have this removed prior to surgery.  If it is not removed, there is a chance that hospital personnel will need to cut it off on the day of surgery.  Do not wear lotions, powders, or perfumes.   Do not shave body hair from the neck down 48 hours before surgery.  Contact lenses, hearing aids and dentures may not be worn into surgery.  Do not bring valuables to the hospital. [facility] is not responsible for any missing/lost belongings or valuables.   Notify your doctor if there is any change in your medical condition (cold, fever, infection).  Wear comfortable clothing (specific to your surgery type) to the hospital.  After surgery, you can help prevent lung complications by doing breathing exercises.  Take deep breaths and cough every 1-2 hours. Your doctor may order a device called an Incentive Spirometer to help you take deep breaths. When coughing or sneezing, hold a pillow firmly against your incision with both hands. This is called "splinting." Doing this helps protect your incision. It also decreases belly discomfort.  If you are being admitted to the hospital overnight, leave your suitcase in the car. After surgery it may be brought to your room.  In case of increased patient census, it may be necessary for you, the patient, to continue your postoperative care in  the Same Day Surgery department.  If you are being discharged the day of surgery, you will not be allowed to drive home. You will need a responsible individual to drive you home and stay with you for 24 hours after surgery.   If you are taking public transportation, you will need to have a responsible individual with you.  Please call the  Pre-admissions Testing Dept. at [phone] if you have any questions about these instructions.  Surgery Visitation Policy:  Patients having surgery or a procedure may have two visitors.  Children under the age of 104 must have an adult with them who is not the patient.  Inpatient Visitation:    Visiting hours are 7 a.m. to 8 p.m. Up to four visitors are allowed at one time in a patient room. The visitors may rotate out with other people during the day.  One visitor age 28 or older may stay with the patient overnight and must be in the room by 8 p.m.

## 2023-05-27 ENCOUNTER — Other Ambulatory Visit: Payer: Self-pay | Admitting: Nurse Practitioner

## 2023-05-27 ENCOUNTER — Encounter
Admission: RE | Admit: 2023-05-27 | Discharge: 2023-05-27 | Disposition: A | Discharge status: Home / Self Care | Payer: 59 | Source: Ambulatory Visit | Attending: Urology | Admitting: Urology

## 2023-05-27 DIAGNOSIS — Z01818 Encounter for other preprocedural examination: Secondary | ICD-10-CM | POA: Insufficient documentation

## 2023-05-27 DIAGNOSIS — I10 Essential (primary) hypertension: Secondary | ICD-10-CM | POA: Insufficient documentation

## 2023-05-27 DIAGNOSIS — Z01810 Encounter for preprocedural cardiovascular examination: Secondary | ICD-10-CM | POA: Diagnosis not present

## 2023-05-27 DIAGNOSIS — E559 Vitamin D deficiency, unspecified: Secondary | ICD-10-CM

## 2023-05-27 DIAGNOSIS — Z01812 Encounter for preprocedural laboratory examination: Secondary | ICD-10-CM

## 2023-05-27 LAB — POTASSIUM: Potassium: 3.9 mmol/L (ref 3.5–5.1)

## 2023-06-02 MED ORDER — CEFAZOLIN SODIUM-DEXTROSE 2-4 GM/100ML-% IV SOLN
2.0000 g | INTRAVENOUS | Status: AC
  Administered 2023-06-03: 2 g via INTRAVENOUS

## 2023-06-02 MED ORDER — LACTATED RINGERS IV SOLN: INTRAVENOUS | Status: DC

## 2023-06-02 MED ORDER — CHLORHEXIDINE GLUCONATE 0.12 % MT SOLN
15.0000 mL | Freq: Once | OROMUCOSAL | Status: AC
  Administered 2023-06-03: 15 mL via OROMUCOSAL

## 2023-06-02 MED ORDER — FAMOTIDINE 20 MG PO TABS
20.0000 mg | ORAL_TABLET | Freq: Once | ORAL | Status: AC
  Administered 2023-06-03: 20 mg via ORAL

## 2023-06-02 MED ORDER — ORAL CARE MOUTH RINSE: 15.0000 mL | Freq: Once | OROMUCOSAL | Status: AC

## 2023-06-03 ENCOUNTER — Ambulatory Visit
Admission: RE | Admit: 2023-06-03 | Discharge: 2023-06-03 | Disposition: A | Discharge status: Home / Self Care | Payer: 59 | Source: Ambulatory Visit | Attending: Urology | Admitting: Urology

## 2023-06-03 ENCOUNTER — Encounter: Payer: Self-pay | Admitting: Urology

## 2023-06-03 ENCOUNTER — Other Ambulatory Visit: Payer: Self-pay

## 2023-06-03 ENCOUNTER — Ambulatory Visit: Payer: 59 | Admitting: Urgent Care

## 2023-06-03 ENCOUNTER — Ambulatory Visit: Payer: 59 | Admitting: Anesthesiology

## 2023-06-03 ENCOUNTER — Encounter
Admission: RE | Disposition: A | Discharge status: Home / Self Care | Payer: Self-pay | Source: Ambulatory Visit | Attending: Urology

## 2023-06-03 DIAGNOSIS — F17290 Nicotine dependence, other tobacco product, uncomplicated: Secondary | ICD-10-CM | POA: Insufficient documentation

## 2023-06-03 DIAGNOSIS — I10 Essential (primary) hypertension: Secondary | ICD-10-CM | POA: Diagnosis not present

## 2023-06-03 DIAGNOSIS — R3915 Urgency of urination: Secondary | ICD-10-CM | POA: Insufficient documentation

## 2023-06-03 DIAGNOSIS — Z8673 Personal history of transient ischemic attack (TIA), and cerebral infarction without residual deficits: Secondary | ICD-10-CM | POA: Insufficient documentation

## 2023-06-03 DIAGNOSIS — N401 Enlarged prostate with lower urinary tract symptoms: Secondary | ICD-10-CM | POA: Insufficient documentation

## 2023-06-03 DIAGNOSIS — N138 Other obstructive and reflux uropathy: Secondary | ICD-10-CM | POA: Diagnosis not present

## 2023-06-03 HISTORY — PX: CYSTOSCOPY WITH INSERTION OF UROLIFT: SHX6678

## 2023-06-03 SURGERY — CYSTOSCOPY WITH INSERTION OF UROLIFT
Anesthesia: General

## 2023-06-03 MED ORDER — DEXAMETHASONE SODIUM PHOSPHATE 10 MG/ML IJ SOLN
INTRAMUSCULAR | Status: DC | PRN
  Administered 2023-06-03: 10 mg via INTRAVENOUS

## 2023-06-03 MED ORDER — FAMOTIDINE 20 MG PO TABS
ORAL_TABLET | ORAL | Status: AC
  Filled 2023-06-03: qty 1

## 2023-06-03 MED ORDER — ONDANSETRON HCL 4 MG/2ML IJ SOLN
INTRAMUSCULAR | Status: DC | PRN
  Administered 2023-06-03 (×2): 4 mg via INTRAVENOUS

## 2023-06-03 MED ORDER — MIDAZOLAM HCL 2 MG/2ML IJ SOLN
INTRAMUSCULAR | Status: DC | PRN
  Administered 2023-06-03: 2 mg via INTRAVENOUS

## 2023-06-03 MED ORDER — KETAMINE HCL 10 MG/ML IJ SOLN
INTRAMUSCULAR | Status: DC | PRN
Start: 2023-06-03 — End: 2023-06-03
  Administered 2023-06-03 (×2): 10 mg via INTRAVENOUS
  Administered 2023-06-03: 30 mg via INTRAVENOUS

## 2023-06-03 MED ORDER — KETAMINE HCL 50 MG/5ML IJ SOSY
PREFILLED_SYRINGE | INTRAMUSCULAR | Status: AC
  Filled 2023-06-03: qty 5

## 2023-06-03 MED ORDER — ACETAMINOPHEN 10 MG/ML IV SOLN
INTRAVENOUS | Status: DC | PRN
  Administered 2023-06-03: 1000 mg via INTRAVENOUS

## 2023-06-03 MED ORDER — LACTATED RINGERS IV SOLN: INTRAVENOUS | Status: DC | PRN

## 2023-06-03 MED ORDER — PROPOFOL 10 MG/ML IV BOLUS
INTRAVENOUS | Status: DC | PRN
  Administered 2023-06-03: 50 mg via INTRAVENOUS
  Administered 2023-06-03 (×2): 20 mg via INTRAVENOUS

## 2023-06-03 MED ORDER — PROPOFOL 500 MG/50ML IV EMUL
INTRAVENOUS | Status: DC | PRN
Start: 2023-06-03 — End: 2023-06-03
  Administered 2023-06-03: 145 ug/kg/min via INTRAVENOUS

## 2023-06-03 MED ORDER — GLYCOPYRROLATE 0.2 MG/ML IJ SOLN
INTRAMUSCULAR | Status: DC | PRN
  Administered 2023-06-03: .2 mg via INTRAVENOUS

## 2023-06-03 MED ORDER — CEFAZOLIN SODIUM-DEXTROSE 2-4 GM/100ML-% IV SOLN
INTRAVENOUS | Status: AC
  Filled 2023-06-03: qty 100

## 2023-06-03 MED ORDER — SODIUM CHLORIDE 0.9 % IR SOLN
Status: DC | PRN
  Administered 2023-06-03: 3000 mL

## 2023-06-03 MED ORDER — MIDAZOLAM HCL 2 MG/2ML IJ SOLN
INTRAMUSCULAR | Status: AC
  Filled 2023-06-03: qty 2

## 2023-06-03 MED ORDER — HYDROCODONE-ACETAMINOPHEN 5-325 MG PO TABS
1.0000 | ORAL_TABLET | Freq: Four times a day (QID) | ORAL | 0 refills | Status: DC | PRN
Start: 2023-06-03 — End: 2023-07-16

## 2023-06-03 MED ORDER — ACETAMINOPHEN 10 MG/ML IV SOLN
INTRAVENOUS | Status: AC
  Filled 2023-06-03: qty 100

## 2023-06-03 MED ORDER — CHLORHEXIDINE GLUCONATE 0.12 % MT SOLN
OROMUCOSAL | Status: AC
  Filled 2023-06-03: qty 15

## 2023-06-03 MED ORDER — FENTANYL CITRATE (PF) 100 MCG/2ML IJ SOLN
INTRAMUSCULAR | Status: AC
  Filled 2023-06-03: qty 2

## 2023-06-03 SURGICAL SUPPLY — 14 items
BAG DRAIN SIEMENS DORNER NS (MISCELLANEOUS) ×1 IMPLANT
BAG DRN NS LF (MISCELLANEOUS) ×1
GLOVE BIO SURGEON STRL SZ 6.5 (GLOVE) ×1 IMPLANT
GOWN STRL REUS W/ TWL LRG LVL3 (GOWN DISPOSABLE) ×2 IMPLANT
GOWN STRL REUS W/TWL LRG LVL3 (GOWN DISPOSABLE) ×2
KIT TURNOVER CYSTO (KITS) ×1 IMPLANT
PACK CYSTO AR (MISCELLANEOUS) ×1 IMPLANT
SET CYSTO W/LG BORE CLAMP LF (SET/KITS/TRAYS/PACK) ×1 IMPLANT
SURGILUBE 2OZ TUBE FLIPTOP (MISCELLANEOUS) IMPLANT
SYSTEM UROLIFT 2 CART W/ HNDL (Male Continence) ×1 IMPLANT
SYSTEM UROLIFT 2 CARTRIDGE (Male Continence) IMPLANT
WATER STERILE IRR 1000ML POUR (IV SOLUTION) ×1 IMPLANT
WATER STERILE IRR 3000ML UROMA (IV SOLUTION) ×1 IMPLANT
WATER STERILE IRR 500ML POUR (IV SOLUTION) ×1 IMPLANT

## 2023-06-03 NOTE — Anesthesia Preprocedure Evaluation (Addendum)
Anesthesia Evaluation  Patient identified by MRN, date of birth, ID band Patient awake    Reviewed: Allergy & Precautions, NPO status , Patient's Chart, lab work & pertinent test results  History of Anesthesia Complications Negative for: history of anesthetic complications  Airway Mallampati: I   Neck ROM: Full    Dental  (+) Missing, Loose   Pulmonary former smoker (quit 02/2022; current vaping)   Pulmonary exam normal breath sounds clear to auscultation       Cardiovascular hypertension, Normal cardiovascular exam Rhythm:Regular Rate:Normal  ECG 05/27/23: normal   Neuro/Psych  Headaches PSYCHIATRIC DISORDERS  Depression    CVA (2014), No Residual Symptoms    GI/Hepatic negative GI ROS,,,  Endo/Other  negative endocrine ROS    Renal/GU    BPH    Musculoskeletal   Abdominal   Peds  Hematology negative hematology ROS (+)   Anesthesia Other Findings   Reproductive/Obstetrics                             Anesthesia Physical Anesthesia Plan  ASA: 2  Anesthesia Plan: General   Post-op Pain Management:    Induction: Intravenous  PONV Risk Score and Plan: 1 and Propofol infusion, TIVA, Treatment may vary due to age or medical condition and Ondansetron  Airway Management Planned: Natural Airway  Additional Equipment:   Intra-op Plan:   Post-operative Plan:   Informed Consent: I have reviewed the patients History and Physical, chart, labs and discussed the procedure including the risks, benefits and alternatives for the proposed anesthesia with the patient or authorized representative who has indicated his/her understanding and acceptance.       Plan Discussed with: CRNA  Anesthesia Plan Comments: (LMA/GETA backup discussed.  Patient consented for risks of anesthesia including but not limited to:  - adverse reactions to medications - damage to eyes, teeth, lips or other oral  mucosa - nerve damage due to positioning  - sore throat or hoarseness - damage to heart, brain, nerves, lungs, other parts of body or loss of life  Informed patient about role of CRNA in peri- and intra-operative care.  Patient voiced understanding.)        Anesthesia Quick Evaluation

## 2023-06-03 NOTE — Anesthesia Procedure Notes (Signed)
Procedure Name: General with mask airway Date/Time: 06/03/2023 9:07 AM  Performed by: Mohammed Kindle, CRNAPre-anesthesia Checklist: Patient identified, Emergency Drugs available, Suction available and Patient being monitored Patient Re-evaluated:Patient Re-evaluated prior to induction Oxygen Delivery Method: Simple face mask Induction Type: IV induction Placement Confirmation: positive ETCO2, CO2 detector and breath sounds checked- equal and bilateral Dental Injury: Teeth and Oropharynx as per pre-operative assessment

## 2023-06-03 NOTE — Progress Notes (Signed)
Ambulating and voiding. Voided 150 mls blood tinged urine. Post bladder scan shows 0 amount. Voices readiness to be discharged home

## 2023-06-03 NOTE — Transfer of Care (Signed)
Immediate Anesthesia Transfer of Care Note  Patient: Alex Bowers  Procedure(s) Performed: CYSTOSCOPY WITH INSERTION OF UROLIFT  Patient Location: PACU  Anesthesia Type:General  Level of Consciousness: awake, drowsy, and patient cooperative  Airway & Oxygen Therapy: Patient Spontanous Breathing and Patient connected to face mask oxygen  Post-op Assessment: Report given to RN and Post -op Vital signs reviewed and stable  Post vital signs: Reviewed and stable  Last Vitals:  Vitals Value Taken Time  BP 105/76 06/03/23 0924  Temp 36.7 C 06/03/23 0924  Pulse 96 06/03/23 0927  Resp 17 06/03/23 0927  SpO2 100 % 06/03/23 0927  Vitals shown include unfiled device data.  Last Pain:  Vitals:   06/03/23 0924  TempSrc:   PainSc: Asleep         Complications: No notable events documented.

## 2023-06-03 NOTE — Anesthesia Postprocedure Evaluation (Signed)
Anesthesia Post Note  Patient: Alex Bowers  Procedure(s) Performed: CYSTOSCOPY WITH INSERTION OF UROLIFT  Patient location during evaluation: PACU Anesthesia Type: General Level of consciousness: awake and alert, oriented and patient cooperative Pain management: pain level controlled Vital Signs Assessment: post-procedure vital signs reviewed and stable Respiratory status: spontaneous breathing, nonlabored ventilation and respiratory function stable Cardiovascular status: blood pressure returned to baseline and stable Postop Assessment: adequate PO intake Anesthetic complications: no   No notable events documented.   Last Vitals:  Vitals:   06/03/23 0930 06/03/23 0945  BP: 114/87 (!) 144/98  Pulse: 95 93  Resp: 17 17  Temp:    SpO2: 100% 96%    Last Pain:  Vitals:   06/03/23 0930  TempSrc:   PainSc: 0-No pain                 Reed Breech

## 2023-06-03 NOTE — H&P (Signed)
06/03/23  RRR CTAB  Alex Bowers May 16, 1956 [id number]   Referring provider: Sallyanne Kuster, NP [address] [place],  [place] [address]      Chief Complaint  Patient presents with   Results      HPI: 67 year-old male with a personal history of BPH.    He is followed by Alex Bowers with urinary symptoms primarily irritative in nature. At his last visit, he was having a weaker urinary stream on finasteride and tadalafil. He was referred to me for cystoscopy/ TRUS or for the evaluation of his prostate anatomy. He did have some slight intrusion in the lateral lobe, but no discrete median lobe or elevated bladder neck. His prostate volume was only 15 grams.   Due to his somewhat unusual urinary symptoms, he was referred for urodynamics.    He had a max capacity of 499 mL's. His first sensation was at 209 mL's. He did have instability, including an unstable contraction, with urgency but did not leak during that contraction. He did generate a voluntary contraction and voided 87 cc's with a max flow of only 8 mL per second, with a max detrusor pressure while voiding of 46. He had an obstructive flow pattern. He did have some mild intermittent EMG activity during voiding. His PVR was 412. He had no reflux on the fluoroscopy. Mild elevated bladder neck base. He has been interested in UroLift.     PMH:     Past Medical History:  Diagnosis Date   Dysplastic nevus 07/08/2014    Right proximal lateral deltoid. Sever atypia, lateral margin involved. Excised 09/07/2014, margins free.   Dysplastic nevus 11/16/2014    Right distal post. deltoid. Melanocytic hyperplasia and incidental dysplastic nevus with mild atypia, margins free. Excised.   Dysplastic nevus 07/03/2021    L lat buttocks - moderate   History of dysplastic nevus 05/18/2020    left anterior deltoid, severe / shave removal   Hyperlipidemia     Hypertension     Personal history of tobacco use, presenting hazards to  health 12/29/2015   Stroke [facility]) 2014          Surgical History:      Past Surgical History:  Procedure Laterality Date   COLONOSCOPY   2015   HERNIA REPAIR Right 06/09/14    inguinal    teeth removal               Home Medications:  Allergies as of 05/01/2023   No Known Allergies         Medication List           Accurate as of May 01, 2023  4:14 PM. If you have any questions, ask your nurse or doctor.              amLODipine 5 MG tablet Commonly known as: NORVASC TAKE 1 TABLET (5 MG TOTAL) BY MOUTH DAILY.    aspirin EC 81 MG tablet Take 81 mg by mouth daily.    buPROPion 300 MG 24 hr tablet Commonly known as: WELLBUTRIN XL TAKE 1 TABLET BY MOUTH EVERY DAY    citalopram 40 MG tablet Commonly known as: CELEXA Take 20 mg by mouth daily.    finasteride 5 MG tablet Commonly known as: PROSCAR Take 1 tablet (5 mg total) by mouth daily.    lisinopril 40 MG tablet Commonly known as: ZESTRIL TAKE 1 TABLET BY MOUTH EVERY DAY    meloxicam 15 MG tablet Commonly known as: MOBIC  TAKE 1 TABLET (15 MG TOTAL) BY MOUTH DAILY.    methocarbamol 500 MG tablet Commonly known as: ROBAXIN TAKE 1 TABLET BY MOUTH THREE TIMES A DAY AS NEEDED FOR MUSCLE SPASM    nicotine polacrilex 4 MG lozenge Commonly known as: COMMIT SMARTSIG:1 Lozenge(s) By Mouth Every 6-8 Hours PRN    rosuvastatin 10 MG tablet Commonly known as: CRESTOR TAKE 1 TABLET (10 MG TOTAL) BY MOUTH DAILY. FOR HIGH CHOLESTEROL    tadalafil 5 MG tablet Commonly known as: CIALIS Take 1 tablet (5 mg total) by mouth daily as needed for erectile dysfunction.    Vitamin D (Ergocalciferol) 1.25 MG (50000 UNIT) Caps capsule Commonly known as: DRISDOL TAKE 1 CAPSULE (50,000 UNITS TOTAL) BY MOUTH EVERY 7 (SEVEN) DAYS             Family History:      Family History  Problem Relation Age of Onset   Colon polyps Mother     Heart attack Father     Cancer - Other Brother          phageal   Prostate  cancer Neg Hx     Kidney cancer Neg Hx     Bladder Cancer Neg Hx            Social History:  reports that he has been smoking pipe. He started smoking about 14 months ago. He has never used smokeless tobacco. He reports current alcohol use. He reports that he does not use drugs.     Physical Exam: BP 125/79   Pulse 80   Ht 5\' 8"  (1.727 m)   Wt 156 lb 2 oz (70.8 kg)   BMI 23.74 kg/m   Constitutional:  Alert and oriented, No acute distress. HEENT: [place] AT, moist mucus membranes.  Trachea midline, no masses. Neurologic: Grossly intact, no focal deficits, moving all 4 extremities. Psychiatric: Normal mood and affect.   Assessment & Plan:     1. BPH - He has a small prostate volume but significant urinary symptoms. - Urodynamics showed obstructive flow pattern and mild intermittent EMG activity during voiding, suggesting possible pelvic floor dysfunction. - Discussed the option of UroLift, which is a minimally invasive procedure with a 50-50 chance of improving symptoms. - Alternative option includes physical therapy to address potential pelvic floor dysfunction. - He prefers to move forward with a UroLift. - Ensure no contraindications for UroLift procedure. -Extensive discussion of risk including bleeding, retention, pain, implant complication/ migration/ stone formation, and most importantly risk of no improvement in urinary symptoms reviewed in detail   2. Urinary urgency frequency - Symptoms may be due to a combination of BPH and pelvic floor dysfunction. - Pre-op urine sample to be collected today.     Return for UroLift.   I have reviewed the above documentation for accuracy and completeness, and I agree with the above.    Alex Scotland, MD     [facility] [address], [address] [place], [place] [address] [phone]

## 2023-06-03 NOTE — Discharge Instructions (Addendum)
Urolift Post-Operative Instructions ? ? ?  ?Patient Expectations ? ? ?1. Mild blood in your urine for about 1 week. ? ?2. Urinary buring, frequency, and urgency for 10 days. ? ?3. Mild pelvic pain 1-2 weeks. ? ? ? ? ?Return to Activity ? ?  ? ?1. Drink water post procedure. ? ?2. Take meds as needed.  Tylenol and/or Motrin is most helpful.  You may also by Pyridium/Azo over-the-counter for urinary burning. ? ?3. No lifting or straining 48hrs. ? ?4. Other activity when they feel up to it. ? ? ?AMBULATORY SURGERY  ?DISCHARGE INSTRUCTIONS ? ? ?The drugs that you were given will stay in your system until tomorrow so for the next 24 hours you should not: ? ?Drive an automobile ?Make any legal decisions ?Drink any alcoholic beverage ? ? ?You may resume regular meals tomorrow.  Today it is better to start with liquids and gradually work up to solid foods. ? ?You may eat anything you prefer, but it is better to start with liquids, then soup and crackers, and gradually work up to solid foods. ? ? ?Please notify your doctor immediately if you have any unusual bleeding, trouble breathing, redness and pain at the surgery site, drainage, fever, or pain not relieved by medication. ? ? ? ?Additional Instructions: ? ? ?Please contact your physician with any problems or Same Day Surgery at [phone], Monday through Friday 6 am to 4 pm, or [place] at [facility] number at [phone].  ?

## 2023-06-03 NOTE — Op Note (Signed)
Preoperative diagnosis: BPH with obstructive symptomatology   Postoperative diagnosis: BPH with obstructive symptomatology   Principal procedure: Urolift procedure, with the placement of 4 implants.   Surgeon: Vanna Scotland   Anesthesia: MAC   Complications: None   Drains: None   Estimated blood loss: < 5 mL   Indications: 67 year-old male with obstructive symptomatology secondary to BPH.  The patient's symptoms have progressed, and he has requested further management.  Management options including TURP with resection/ablation of the prostate as well as Urolift were discussed.  The patient has chosen to have a Urolift procedure.  He has been instructed to the procedure as well as risks and complications which include but are not limited to infection, bleeding, and inadequate treatment with the Urolift procedure alone, anesthetic complications, among others.  He understands these and desires to proceed.   Findings: Using the 17 French cystoscope, urethra and bladder were inspected.  There were no urethral lesions.  Prostatic urethra was as previously noted widely patent.  The bladder was inspected circumferentially.  This revealed normal findings.   Description of procedure: The patient was properly identified in the holding area.  He received preoperative IV antibiotics.  He was taken to the operating room where MAC.  He is placed in the dorsolithotomy position.  Genitalia and perineum were prepped and draped.  Proper timeout was performed.   A 60F cystoscope was inserted into the bladder with findings as described above.  The 1st pair of implants were placed at the bladder neck ~1.5 cm from the bladder Neck. The 2nd pair of implants were placed at the level of the verumontanum.   A final cystoscopy was conducted to confirm the presence of a continuous anterior channel was present through the prostatic urethra with irrigation flow turned off.    4 implants were delivered in total.     Following this, the scope was removed.  After anesthetic reversal he was transported to the PACU in stable condition.  He tolerated the procedure well.   Plan: He will follow-up with me in 4 to 6 weeks for IPSS/PVR.

## 2023-06-04 ENCOUNTER — Telehealth: Payer: Self-pay

## 2023-06-04 ENCOUNTER — Encounter: Payer: Self-pay | Admitting: Urology

## 2023-06-04 NOTE — Telephone Encounter (Signed)
Pt left message stating he had surgery yesterday and now he has constipation. What can he do??      S/p cystoscopy with urolift on 10/14 for BPH with urinary obstruction. By AJB  Pain with urination yesterday and today. Blood in urine yesterday none today. Last BM was Sunday.   +eating not much +drinking + pain- took pain med x 3 yesterday. Trying not to take it. As it may cause constipation No fever  Advised pain with urination is normal after urolift. Stay hydrated. May want to add stool softener daily. Eat foods that facilitate BM.   Contact office if pain is not improving or fever of 101 or higher.  Pt voiced understanding.

## 2023-07-09 ENCOUNTER — Ambulatory Visit: Payer: 59 | Admitting: Urology

## 2023-07-10 ENCOUNTER — Encounter: Payer: Self-pay | Admitting: Dermatology

## 2023-07-10 ENCOUNTER — Ambulatory Visit: Payer: 59 | Admitting: Dermatology

## 2023-07-10 DIAGNOSIS — L578 Other skin changes due to chronic exposure to nonionizing radiation: Secondary | ICD-10-CM

## 2023-07-10 DIAGNOSIS — D1801 Hemangioma of skin and subcutaneous tissue: Secondary | ICD-10-CM

## 2023-07-10 DIAGNOSIS — D229 Melanocytic nevi, unspecified: Secondary | ICD-10-CM

## 2023-07-10 DIAGNOSIS — Z86018 Personal history of other benign neoplasm: Secondary | ICD-10-CM

## 2023-07-10 DIAGNOSIS — L814 Other melanin hyperpigmentation: Secondary | ICD-10-CM

## 2023-07-10 DIAGNOSIS — L821 Other seborrheic keratosis: Secondary | ICD-10-CM

## 2023-07-10 DIAGNOSIS — D492 Neoplasm of unspecified behavior of bone, soft tissue, and skin: Secondary | ICD-10-CM | POA: Diagnosis not present

## 2023-07-10 DIAGNOSIS — W908XXA Exposure to other nonionizing radiation, initial encounter: Secondary | ICD-10-CM

## 2023-07-10 DIAGNOSIS — Z1283 Encounter for screening for malignant neoplasm of skin: Secondary | ICD-10-CM

## 2023-07-10 DIAGNOSIS — L820 Inflamed seborrheic keratosis: Secondary | ICD-10-CM | POA: Diagnosis not present

## 2023-07-10 NOTE — Patient Instructions (Addendum)

## 2023-07-10 NOTE — Progress Notes (Signed)
Follow-Up Visit   Subjective  Alex Bowers is a 67 y.o. male who presents for the following: Skin Cancer Screening and Full Body Skin Exam, hx of Dysplastic nevi, check mole back, getting larger and itchy  The patient presents for Total-Body Skin Exam (TBSE) for skin cancer screening and mole check. The patient has spots, moles and lesions to be evaluated, some may be new or changing and the patient may have concern these could be cancer.    The following portions of the chart were reviewed this encounter and updated as appropriate: medications, allergies, medical history  Review of Systems:  No other skin or systemic complaints except as noted in HPI or Assessment and Plan.  Objective  Well appearing patient in no apparent distress; mood and affect are within normal limits.  A full examination was performed including scalp, head, eyes, ears, nose, lips, neck, chest, axillae, abdomen, back, buttocks, bilateral upper extremities, bilateral lower extremities, hands, feet, fingers, toes, fingernails, and toenails. All findings within normal limits unless otherwise noted below.   Relevant physical exam findings are noted in the Assessment and Plan.  R scapula Stuck on waxy paps with erythema 1.5cm      L chest x 2 (2) Stuck on waxy paps with erythema    Assessment & Plan   SKIN CANCER SCREENING PERFORMED TODAY.  ACTINIC DAMAGE - Chronic condition, secondary to cumulative UV/sun exposure - diffuse scaly erythematous macules with underlying dyspigmentation - Recommend daily broad spectrum sunscreen SPF 30+ to sun-exposed areas, reapply every 2 hours as needed.  - Staying in the shade or wearing long sleeves, sun glasses (UVA+UVB protection) and wide brim hats (4-inch brim around the entire circumference of the hat) are also recommended for sun protection.  - Call for new or changing lesions.  LENTIGINES, SEBORRHEIC KERATOSES, HEMANGIOMAS - Benign normal skin  lesions - Benign-appearing - Call for any changes  MELANOCYTIC NEVI - Tan-brown and/or pink-flesh-colored symmetric macules and papules - Benign appearing on exam today - Observation - Call clinic for new or changing moles - Recommend daily use of broad spectrum spf 30+ sunscreen to sun-exposed areas.   HISTORY OF DYSPLASTIC NEVUS No evidence of recurrence today Recommend regular full body skin exams Recommend daily broad spectrum sunscreen SPF 30+ to sun-exposed areas, reapply every 2 hours as needed.  Call if any new or changing lesions are noted between office visits  - R proximal lat deltoid, R distal post deltoid, L lat buttocks, L ant deltoid   Neoplasm of skin R scapula  Epidermal / dermal shaving  Lesion diameter (cm):  1.5 Informed consent: discussed and consent obtained   Timeout: patient name, date of birth, surgical site, and procedure verified   Procedure prep:  Patient was prepped and draped in usual sterile fashion Prep type:  Isopropyl alcohol Anesthesia: the lesion was anesthetized in a standard fashion   Anesthetic:  1% lidocaine w/ epinephrine 1-100,000 buffered w/ 8.4% NaHCO3 Instrument used: flexible razor blade   Hemostasis achieved with: pressure, aluminum chloride and electrodesiccation   Outcome: patient tolerated procedure well   Post-procedure details: sterile dressing applied and wound care instructions given   Dressing type: bandage and petrolatum    Specimen 1 - Surgical pathology Differential Diagnosis: D48.5 ISK vs other  Check Margins: No Stuck on waxy paps with erythema 1.5cm  Symptomatic, irritating, patient would like treated.   Inflamed seborrheic keratosis (2) L chest x 2  Symptomatic, irritating, patient would like treated.  Destruction of  lesion - L chest x 2 (2) Complexity: simple   Destruction method: cryotherapy   Informed consent: discussed and consent obtained   Timeout:  patient name, date of birth, surgical site,  and procedure verified Lesion destroyed using liquid nitrogen: Yes   Region frozen until ice ball extended beyond lesion: Yes   Outcome: patient tolerated procedure well with no complications   Post-procedure details: wound care instructions given     Return in about 1 year (around 07/09/2024) for TBSE, Hx of Dysplastic nevi.  I, Ardis Rowan, RMA, am acting as scribe for Armida Sans, MD .   Documentation: I have reviewed the above documentation for accuracy and completeness, and I agree with the above.  Armida Sans, MD

## 2023-07-16 ENCOUNTER — Ambulatory Visit: Payer: 59 | Admitting: Urology

## 2023-07-16 VITALS — BP 134/80 | HR 74 | Ht 68.0 in | Wt 150.0 lb

## 2023-07-16 DIAGNOSIS — R3912 Poor urinary stream: Secondary | ICD-10-CM | POA: Diagnosis not present

## 2023-07-16 DIAGNOSIS — N401 Enlarged prostate with lower urinary tract symptoms: Secondary | ICD-10-CM | POA: Diagnosis not present

## 2023-07-16 DIAGNOSIS — N138 Other obstructive and reflux uropathy: Secondary | ICD-10-CM

## 2023-07-16 DIAGNOSIS — M6289 Other specified disorders of muscle: Secondary | ICD-10-CM

## 2023-07-16 LAB — BLADDER SCAN AMB NON-IMAGING: Scan Result: 35

## 2023-07-16 LAB — SURGICAL PATHOLOGY

## 2023-07-16 NOTE — Progress Notes (Signed)
Alex Bowers,acting as a scribe for Vanna Scotland, MD.,have documented all relevant documentation on the behalf of Vanna Scotland, MD,as directed by  Vanna Scotland, MD while in the presence of Vanna Scotland, MD.  07/16/23 3:15 PM   Alex Bowers Feb 27, 1956 [id number]  Referring provider: Sallyanne Kuster, NP [address] [place],  [place] [address]  No chief complaint on file.   HPI:  67 year-old male who presents today for follow up. He has a personal history of BPH and urinary issues. He had a very small 15 gram prostate. Ultimately, he did undergo urodynamics which did show an obstructive flow pattern but was also suggestive of possible pelvic floor dysfunction. He elected to go ahead and try Urolift to see if it helped his urinary issues despite this.   He has an occasional weak stream and feels that the procedure was not very helpful. He is not on any medications other than daily Cialis, which he primarily uses for erectile dysfunction. He expresses concern about constipation post-surgery, which resolved with stool softeners. He admits to being a shy voider as a young adult, which may have contributed to current pelvic floor dysfunction.   Results for orders placed or performed in visit on 07/16/23  BLADDER SCAN AMB NON-IMAGING  Result Value Ref Range   Scan Result 35     IPSS     Row Name 07/16/23 1400         International Prostate Symptom Score   How often have you had the sensation of not emptying your bladder? Less than half the time     How often have you had to urinate less than every two hours? Less than half the time     How often have you found you stopped and started again several times when you urinated? Less than half the time     How often have you found it difficult to postpone urination? Less than half the time     How often have you had a weak urinary stream? About half the time     How often have you had to strain to start urination?  Less than half the time     How many times did you typically get up at night to urinate? 1 Time     Total IPSS Score 14       Quality of Life due to urinary symptoms   If you were to spend the rest of your life with your urinary condition just the way it is now how would you feel about that? Mostly Satisfied              Score:  1-7 Mild 8-19 Moderate 20-35 Severe    PMH: Past Medical History:  Diagnosis Date   BPH (benign prostatic hyperplasia)    Depression    Dysplastic nevus 07/08/2014   Right proximal lateral deltoid. Sever atypia, lateral margin involved. Excised 09/07/2014, margins free.   Dysplastic nevus 11/16/2014   Right distal post. deltoid. Melanocytic hyperplasia and incidental dysplastic nevus with mild atypia, margins free. Excised.   Dysplastic nevus 07/03/2021   L lat buttocks - moderate   Dyspnea    Headache    younger   History of dysplastic nevus 05/18/2020   left anterior deltoid, severe / shave removal   Hyperlipidemia    Hypertension    Pelvic floor dysfunction    Personal history of tobacco use, presenting hazards to health 12/29/2015   Stroke [facility]) 2014  Surgical History: Past Surgical History:  Procedure Laterality Date   COLONOSCOPY  2015   CYSTOSCOPY WITH INSERTION OF UROLIFT N/A 06/03/2023   Procedure: CYSTOSCOPY WITH INSERTION OF UROLIFT;  Surgeon: Vanna Scotland, MD;  Location: [facility] ORS;  Service: Urology;  Laterality: N/A;   HERNIA REPAIR Right 06/09/14   inguinal    teeth removal       Home Medications:  Allergies as of 07/16/2023   No Known Allergies      Medication List        Accurate as of July 16, 2023  3:15 PM. If you have any questions, ask your nurse or doctor.          STOP taking these medications    HYDROcodone-acetaminophen 5-325 MG tablet Commonly known as: NORCO/VICODIN   sulfamethoxazole-trimethoprim 800-160 MG tablet Commonly known as: BACTRIM DS       TAKE these medications     amLODipine 5 MG tablet Commonly known as: NORVASC Take 1 tablet (5 mg total) by mouth daily.   aspirin EC 81 MG tablet Take 81 mg by mouth daily.   buPROPion 300 MG 24 hr tablet Commonly known as: WELLBUTRIN XL TAKE 1 TABLET BY MOUTH EVERY DAY   finasteride 5 MG tablet Commonly known as: PROSCAR Take 1 tablet (5 mg total) by mouth daily.   meloxicam 15 MG tablet Commonly known as: MOBIC TAKE 1 TABLET (15 MG TOTAL) BY MOUTH DAILY.   methocarbamol 500 MG tablet Commonly known as: ROBAXIN TAKE 1 TABLET BY MOUTH THREE TIMES A DAY AS NEEDED FOR MUSCLE SPASM   rosuvastatin 10 MG tablet Commonly known as: CRESTOR TAKE 1 TABLET (10 MG TOTAL) BY MOUTH DAILY. FOR HIGH CHOLESTEROL   tadalafil 5 MG tablet Commonly known as: CIALIS Take 1 tablet (5 mg total) by mouth daily as needed for erectile dysfunction.   Vitamin D (Ergocalciferol) 1.25 MG (50000 UNIT) Caps capsule Commonly known as: DRISDOL TAKE 1 CAPSULE (50,000 UNITS TOTAL) BY MOUTH EVERY 7 (SEVEN) DAYS        Family History: Family History  Problem Relation Age of Onset   Colon polyps Mother    Heart attack Father    Cancer - Other Brother        phageal   Prostate cancer Neg Hx    Kidney cancer Neg Hx    Bladder Cancer Neg Hx     Social History:  reports that he has been smoking pipe. He started smoking about 16 months ago. He has never used smokeless tobacco. He reports current alcohol use. He reports that he does not use drugs.   Physical Exam: BP 134/80   Pulse 74   Ht 5\' 8"  (1.727 m)   Wt 150 lb (68 kg)   BMI 22.81 kg/m   Constitutional:  Alert and oriented, No acute distress. HEENT: [place] AT, moist mucus membranes.  Trachea midline, no masses. Neurologic: Grossly intact, no focal deficits, moving all 4 extremities. Psychiatric: Normal mood and affect.   Assessment & Plan:    1. Pelvic floor dysfunction - Suspected due to the discoordination between pelvic floor muscles and bladder during  urination, as evidenced by urodynamics. - Referral to physical therapy for pelvic floor muscle relaxation techniques during urination.  - No further surgical interventions are recommended at this time.  Return in about 7 months (around 02/19/2024) for PA visit with IPSS, PVR, and PSA.   [facility] [address], [address] [place], [place] [address] [phone]

## 2023-07-17 ENCOUNTER — Telehealth: Payer: Self-pay

## 2023-07-17 NOTE — Telephone Encounter (Signed)
Left voicemail for patient to return my call. 

## 2023-07-17 NOTE — Telephone Encounter (Signed)
-----   Message from Armida Sans sent at 07/16/2023  7:05 PM EST ----- FINAL DIAGNOSIS        1. Skin (M), R scapula :       SEBORRHEIC KERATOSIS, IRRITATED   Benign irritated keratosis No further treatment needed

## 2023-07-22 ENCOUNTER — Telehealth: Payer: Self-pay

## 2023-07-22 NOTE — Telephone Encounter (Addendum)
Tried calling patient regarding bx results. No answer. LM for patient to return call.    ----- Message from Armida Sans sent at 07/16/2023  7:05 PM EST ----- FINAL DIAGNOSIS        1. Skin (M), R scapula :       SEBORRHEIC KERATOSIS, IRRITATED   Benign irritated keratosis No further treatment needed

## 2023-07-22 NOTE — Telephone Encounter (Signed)
Patient advised of BX results .aw 

## 2023-07-27 ENCOUNTER — Other Ambulatory Visit: Payer: Self-pay | Admitting: Nurse Practitioner

## 2023-07-27 DIAGNOSIS — G8929 Other chronic pain: Secondary | ICD-10-CM

## 2023-08-03 ENCOUNTER — Telehealth: Payer: Self-pay | Admitting: Nurse Practitioner

## 2023-08-03 NOTE — Telephone Encounter (Signed)
Received call during after-hours yesterday. Patient stated he having dental surgery on Wednesday 08/07/23. Needing to know if he needs to stop blood thinners. Per Alyssa, I explained to patient he is not on blood thinners, however to not take aspirin day of surgery-Toni

## 2023-08-24 ENCOUNTER — Other Ambulatory Visit: Payer: Self-pay | Admitting: Nurse Practitioner

## 2023-08-24 DIAGNOSIS — E559 Vitamin D deficiency, unspecified: Secondary | ICD-10-CM

## 2023-09-11 ENCOUNTER — Other Ambulatory Visit: Payer: Self-pay | Admitting: Nurse Practitioner

## 2023-09-11 DIAGNOSIS — F17210 Nicotine dependence, cigarettes, uncomplicated: Secondary | ICD-10-CM

## 2023-09-11 DIAGNOSIS — I700 Atherosclerosis of aorta: Secondary | ICD-10-CM

## 2023-09-26 ENCOUNTER — Other Ambulatory Visit: Payer: Self-pay | Admitting: Nurse Practitioner

## 2023-09-26 DIAGNOSIS — E559 Vitamin D deficiency, unspecified: Secondary | ICD-10-CM

## 2023-09-27 NOTE — Telephone Encounter (Signed)
 Please review pt vitamin D  was good

## 2023-10-25 ENCOUNTER — Other Ambulatory Visit: Payer: Self-pay | Admitting: Nurse Practitioner

## 2023-10-25 DIAGNOSIS — I10 Essential (primary) hypertension: Secondary | ICD-10-CM

## 2023-10-31 ENCOUNTER — Encounter: Payer: Self-pay | Admitting: Nurse Practitioner

## 2023-10-31 ENCOUNTER — Ambulatory Visit (INDEPENDENT_AMBULATORY_CARE_PROVIDER_SITE_OTHER): Payer: 59 | Admitting: Nurse Practitioner

## 2023-10-31 VITALS — BP 130/80 | HR 77 | Temp 98.4°F | Resp 16 | Ht 68.0 in | Wt 154.2 lb

## 2023-10-31 DIAGNOSIS — E538 Deficiency of other specified B group vitamins: Secondary | ICD-10-CM

## 2023-10-31 DIAGNOSIS — I10 Essential (primary) hypertension: Secondary | ICD-10-CM | POA: Diagnosis not present

## 2023-10-31 DIAGNOSIS — D692 Other nonthrombocytopenic purpura: Secondary | ICD-10-CM

## 2023-10-31 DIAGNOSIS — E559 Vitamin D deficiency, unspecified: Secondary | ICD-10-CM | POA: Diagnosis not present

## 2023-10-31 DIAGNOSIS — M25511 Pain in right shoulder: Secondary | ICD-10-CM

## 2023-10-31 DIAGNOSIS — I700 Atherosclerosis of aorta: Secondary | ICD-10-CM | POA: Diagnosis not present

## 2023-10-31 DIAGNOSIS — G8929 Other chronic pain: Secondary | ICD-10-CM

## 2023-10-31 MED ORDER — MELOXICAM 15 MG PO TABS: 15.0000 mg | ORAL_TABLET | Freq: Every day | ORAL | 1 refills | Status: DC

## 2023-10-31 NOTE — Progress Notes (Signed)
 [facility] [address] [place], [place] [address]  Internal MEDICINE  Office Visit Note  Patient Name: Alex Bowers  [id number]  [id number]  Date of Service: 10/31/2023  Chief Complaint  Patient presents with   Follow-up   Depression   Hypertension   Hyperlipidemia    HPI Alex Bowers presents for a follow-up visit for hypertension, high cholesterol, anxiety, and chronic shoulder pain.  Hypertension -- controlled with lisinopril and amlodipine. Got teeth removed and has dentures now Hyperlipidemia -- taking rosuvastatin.  depression -- taking bupropion  Sees urology    Current Medication: Outpatient Encounter Medications as of 10/31/2023  Medication Sig   amLODipine (NORVASC) 5 MG tablet TAKE 1 TABLET (5 MG TOTAL) BY MOUTH DAILY.   aspirin EC 81 MG tablet Take 81 mg by mouth daily.   buPROPion (WELLBUTRIN XL) 300 MG 24 hr tablet TAKE 1 TABLET BY MOUTH EVERY DAY   finasteride (PROSCAR) 5 MG tablet Take 1 tablet (5 mg total) by mouth daily.   lisinopril (ZESTRIL) 40 MG tablet Take 40 mg by mouth daily.   methocarbamol (ROBAXIN) 500 MG tablet TAKE 1 TABLET BY MOUTH THREE TIMES A DAY AS NEEDED FOR MUSCLE SPASM   rosuvastatin (CRESTOR) 10 MG tablet TAKE 1 TABLET (10 MG TOTAL) BY MOUTH DAILY. FOR HIGH CHOLESTEROL   tadalafil (CIALIS) 5 MG tablet Take 1 tablet (5 mg total) by mouth daily as needed for erectile dysfunction.   Vitamin D, Ergocalciferol, (DRISDOL) 1.25 MG (50000 UNIT) CAPS capsule TAKE 1 CAPSULE (50,000 UNITS TOTAL) BY MOUTH EVERY 7 (SEVEN) DAYS   [DISCONTINUED] meloxicam (MOBIC) 15 MG tablet TAKE 1 TABLET (15 MG TOTAL) BY MOUTH DAILY.   meloxicam (MOBIC) 15 MG tablet Take 1 tablet (15 mg total) by mouth daily.   No facility-administered encounter medications on file as of 10/31/2023.    Surgical History: Past Surgical History:  Procedure Laterality Date   COLONOSCOPY  2015   CYSTOSCOPY WITH INSERTION OF UROLIFT N/A 06/03/2023    Procedure: CYSTOSCOPY WITH INSERTION OF UROLIFT;  Surgeon: Vanna Scotland, MD;  Location: [facility] ORS;  Service: Urology;  Laterality: N/A;   HERNIA REPAIR Right 06/09/14   inguinal    teeth removal       Medical History: Past Medical History:  Diagnosis Date   BPH (benign prostatic hyperplasia)    Depression    Dysplastic nevus 07/08/2014   Right proximal lateral deltoid. Sever atypia, lateral margin involved. Excised 09/07/2014, margins free.   Dysplastic nevus 11/16/2014   Right distal post. deltoid. Melanocytic hyperplasia and incidental dysplastic nevus with mild atypia, margins free. Excised.   Dysplastic nevus 07/03/2021   L lat buttocks - moderate   Dyspnea    Headache    younger   History of dysplastic nevus 05/18/2020   left anterior deltoid, severe / shave removal   Hyperlipidemia    Hypertension    Pelvic floor dysfunction    Personal history of tobacco use, presenting hazards to health 12/29/2015   Stroke [facility]) 2014    Family History: Family History  Problem Relation Age of Onset   Colon polyps Mother    Heart attack Father    Cancer - Other Brother        phageal   Prostate cancer Neg Hx    Kidney cancer Neg Hx    Bladder Cancer Neg Hx     Social History   Socioeconomic History   Marital status: Divorced    Spouse name: Not on file  Number of children: Not on file   Years of education: Not on file   Highest education level: Not on file  Occupational History   Not on file  Tobacco Use   Smoking status: Every Day    Types: Pipe    Start date: 02/19/2022   Smokeless tobacco: Never   Tobacco comments:    No more smoking only vaping now 10/31/23.  Vaping Use   Vaping status: Every Day   Substances: Nicotine, Flavoring  Substance and Sexual Activity   Alcohol use: Yes    Comment: occasionally   Drug use: No   Sexual activity: Not on file  Other Topics Concern   Not on file  Social History Narrative   Not on file   Social Drivers of Health    Financial Resource Strain: Not on file  Food Insecurity: Not on file  Transportation Needs: Not on file  Physical Activity: Not on file  Stress: Not on file  Social Connections: Not on file  Intimate Partner Violence: Not on file      Review of Systems  Constitutional:  Negative for chills, fatigue and unexpected weight change.  HENT:  Negative for congestion, rhinorrhea, sneezing and sore throat.   Eyes:  Negative for redness.  Respiratory:  Negative for cough, chest tightness and shortness of breath.   Cardiovascular:  Negative for chest pain and palpitations.  Gastrointestinal:  Negative for abdominal pain, constipation, diarrhea, nausea and vomiting.  Genitourinary:  Negative for dysuria and frequency.  Musculoskeletal:  Negative for arthralgias, back pain, joint swelling and neck pain.  Skin:  Negative for rash.  Neurological: Negative.  Negative for tremors and numbness.  Hematological:  Negative for adenopathy. Does not bruise/bleed easily.  Psychiatric/Behavioral:  Negative for behavioral problems (Depression), sleep disturbance and suicidal ideas. The patient is not nervous/anxious.     Vital Signs: BP 130/80   Pulse 77   Temp 98.4 F (36.9 C)   Resp 16   Ht 5\' 8"  (1.727 m)   Wt 154 lb 3.2 oz (69.9 kg)   SpO2 99%   BMI 23.45 kg/m    Physical Exam Vitals reviewed.  Constitutional:      General: He is not in acute distress.    Appearance: Normal appearance. He is normal weight. He is not ill-appearing.  HENT:     Head: Normocephalic and atraumatic.  Eyes:     Pupils: Pupils are equal, round, and reactive to light.  Cardiovascular:     Rate and Rhythm: Normal rate and regular rhythm.  Pulmonary:     Effort: Pulmonary effort is normal. No respiratory distress.  Neurological:     Mental Status: He is alert and oriented to person, place, and time.  Psychiatric:        Mood and Affect: Mood normal.        Behavior: Behavior normal.         Assessment/Plan: 1. Essential hypertension (Primary) Stable, continue lisinopril and amlodipine as prescribed.   2. Atherosclerosis of aorta (HCC) Continue rosuvastatin as prescribed.   3. Chronic right shoulder pain Continue meloxicam as prescribed.  - meloxicam (MOBIC) 15 MG tablet; Take 1 tablet (15 mg total) by mouth daily.  Dispense: 90 tablet; Refill: 1  4. Vitamin D deficiency Continue oral supplement   General Counseling: Piper verbalizes understanding of the findings of todays visit and agrees with plan of treatment. I have discussed any further diagnostic evaluation that may be needed or ordered today. We also reviewed  his medications today. he has been encouraged to call the office with any questions or concerns that should arise related to todays visit.    Orders Placed This Encounter  Procedures   CBC with Differential/Platelet   CMP14+EGFR   Lipid Profile   Vitamin D (25 hydroxy)   B12 and Folate Panel    Meds ordered this encounter  Medications   meloxicam (MOBIC) 15 MG tablet    Sig: Take 1 tablet (15 mg total) by mouth daily.    Dispense:  90 tablet    Refill:  1    Return for previously scheduled, AWV, Airabella Barley PCP in september. .   Total time spent:30 Minutes Time spent includes review of chart, medications, test results, and follow up plan with the patient.   [place] Controlled Substance Database was reviewed by me.  This patient was seen by Sallyanne Kuster, FNP-C in collaboration with Dr. Beverely Risen as a part of collaborative care agreement.   Treyvon Blahut R. Tedd Sias, MSN, FNP-C Internal medicine

## 2023-11-13 ENCOUNTER — Other Ambulatory Visit: Payer: Self-pay

## 2023-11-13 ENCOUNTER — Telehealth: Payer: Self-pay

## 2023-11-13 MED ORDER — PREDNISONE 10 MG PO TABS: ORAL_TABLET | ORAL | 0 refills | Status: DC

## 2023-11-13 NOTE — Telephone Encounter (Addendum)
 As per alyssa sent prednisone for 6 days and lmom that we send med and keep appt tomorrow  and pt notified

## 2023-11-13 NOTE — Telephone Encounter (Signed)
 Pt called that he had poison ivy and its close to eyes as per alyssa advised that go to urgent care

## 2023-11-14 ENCOUNTER — Encounter: Payer: Self-pay | Admitting: Nurse Practitioner

## 2023-11-14 ENCOUNTER — Ambulatory Visit: Admitting: Nurse Practitioner

## 2023-11-14 VITALS — BP 138/88 | HR 75 | Temp 98.5°F | Resp 16 | Ht 68.0 in | Wt 155.6 lb

## 2023-11-14 DIAGNOSIS — L237 Allergic contact dermatitis due to plants, except food: Secondary | ICD-10-CM

## 2023-11-14 MED ORDER — PREDNISONE 10 MG PO TABS
ORAL_TABLET | ORAL | 0 refills | Status: DC
Start: 2023-11-14 — End: 2024-05-07

## 2023-11-14 NOTE — Progress Notes (Signed)
 [facility] [address] [place], [place] [address]  Internal MEDICINE  Office Visit Note  Patient Name: Alex Bowers  [id number]  [id number]  Date of Service: 11/14/2023  Chief Complaint  Patient presents with   Acute Visit    Poison ivy     HPI Tylek presents for an acute sick visit for exposure to poison ivy with rash --onset was a couple of days ago Has poison ivy rash on both hands, on the face on both cheeks about 2 inches from the eyes, behind the right ear and per patient report also in private area. Reports itchy blistering rash. Was already given prednisone taper and is using a OTC scrub that is for poison ivy to help with itch.      Current Medication:  Outpatient Encounter Medications as of 11/14/2023  Medication Sig   amLODipine (NORVASC) 5 MG tablet TAKE 1 TABLET (5 MG TOTAL) BY MOUTH DAILY.   aspirin EC 81 MG tablet Take 81 mg by mouth daily.   buPROPion (WELLBUTRIN XL) 300 MG 24 hr tablet TAKE 1 TABLET BY MOUTH EVERY DAY   finasteride (PROSCAR) 5 MG tablet Take 1 tablet (5 mg total) by mouth daily.   lisinopril (ZESTRIL) 40 MG tablet Take 40 mg by mouth daily.   meloxicam (MOBIC) 15 MG tablet Take 1 tablet (15 mg total) by mouth daily.   methocarbamol (ROBAXIN) 500 MG tablet TAKE 1 TABLET BY MOUTH THREE TIMES A DAY AS NEEDED FOR MUSCLE SPASM   rosuvastatin (CRESTOR) 10 MG tablet TAKE 1 TABLET (10 MG TOTAL) BY MOUTH DAILY. FOR HIGH CHOLESTEROL   tadalafil (CIALIS) 5 MG tablet Take 1 tablet (5 mg total) by mouth daily as needed for erectile dysfunction.   Vitamin D, Ergocalciferol, (DRISDOL) 1.25 MG (50000 UNIT) CAPS capsule TAKE 1 CAPSULE (50,000 UNITS TOTAL) BY MOUTH EVERY 7 (SEVEN) DAYS   [DISCONTINUED] predniSONE (DELTASONE) 10 MG tablet Use as directed for 6 days   predniSONE (DELTASONE) 10 MG tablet Use as directed for 6 days   No facility-administered encounter medications on file as of 11/14/2023.      Medical  History: Past Medical History:  Diagnosis Date   BPH (benign prostatic hyperplasia)    Depression    Dysplastic nevus 07/08/2014   Right proximal lateral deltoid. Sever atypia, lateral margin involved. Excised 09/07/2014, margins free.   Dysplastic nevus 11/16/2014   Right distal post. deltoid. Melanocytic hyperplasia and incidental dysplastic nevus with mild atypia, margins free. Excised.   Dysplastic nevus 07/03/2021   L lat buttocks - moderate   Dyspnea    Headache    younger   History of dysplastic nevus 05/18/2020   left anterior deltoid, severe / shave removal   Hyperlipidemia    Hypertension    Pelvic floor dysfunction    Personal history of tobacco use, presenting hazards to health 12/29/2015   Stroke (HCC) 2014     Vital Signs: BP 138/88   Pulse 75   Temp 98.5 F (36.9 C)   Resp 16   Ht 5\' 8"  (1.727 m)   Wt 155 lb 9.6 oz (70.6 kg)   SpO2 98%   BMI 23.66 kg/m    Review of Systems  Respiratory: Negative.  Negative for cough, chest tightness, shortness of breath and wheezing.   Cardiovascular: Negative.  Negative for chest pain and palpitations.  Skin:  Positive for rash.    Physical Exam Vitals reviewed.  Constitutional:      General: He is  not in acute distress.    Appearance: Normal appearance. He is not ill-appearing.  HENT:     Head: Normocephalic and atraumatic.  Cardiovascular:     Rate and Rhythm: Normal rate and regular rhythm.  Skin:    Findings: Rash present. Rash is crusting and vesicular.     Comments: Itchy red vesicular rash  Neurological:     Mental Status: He is alert.       Assessment/Plan: 1. Contact dermatitis due to poison ivy (Primary) Already given 6 day course of prednisone, given an additional 6 days of prednisone. May continue to use OTC topical as discussed if it is helping.  - predniSONE (DELTASONE) 10 MG tablet; Use as directed for 6 days  Dispense: 21 tablet; Refill: 0   General Counseling: Leshon verbalizes  understanding of the findings of todays visit and agrees with plan of treatment. I have discussed any further diagnostic evaluation that may be needed or ordered today. We also reviewed his medications today. he has been encouraged to call the office with any questions or concerns that should arise related to todays visit.    Counseling:    No orders of the defined types were placed in this encounter.   Meds ordered this encounter  Medications   predniSONE (DELTASONE) 10 MG tablet    Sig: Use as directed for 6 days    Dispense:  21 tablet    Refill:  0    Return if symptoms worsen or fail to improve.  [place] Controlled Substance Database was reviewed by me for overdose risk score (ORS)  Time spent:20 Minutes Time spent with patient included reviewing progress notes, labs, imaging studies, and discussing plan for follow up.   This patient was seen by Sallyanne Kuster, FNP-C in collaboration with Dr. Beverely Risen as a part of collaborative care agreement.  Jaramiah Bossard R. Tedd Sias, MSN, FNP-C Internal Medicine

## 2023-11-16 ENCOUNTER — Encounter: Payer: Self-pay | Admitting: Nurse Practitioner

## 2023-11-18 ENCOUNTER — Telehealth: Payer: Self-pay | Admitting: Nurse Practitioner

## 2023-11-18 NOTE — Telephone Encounter (Signed)
 Patient called stating poison ivy has gotten worse. I explained we had no available appointments until Thursday 11/21/23. Suggested he either go to urgent care or ED-[facility]

## 2023-12-19 ENCOUNTER — Other Ambulatory Visit: Payer: Self-pay | Admitting: Nurse Practitioner

## 2023-12-19 DIAGNOSIS — E559 Vitamin D deficiency, unspecified: Secondary | ICD-10-CM

## 2023-12-30 DIAGNOSIS — H1011 Acute atopic conjunctivitis, right eye: Secondary | ICD-10-CM | POA: Diagnosis not present

## 2023-12-30 DIAGNOSIS — H16141 Punctate keratitis, right eye: Secondary | ICD-10-CM | POA: Diagnosis not present

## 2023-12-30 DIAGNOSIS — H20041 Secondary noninfectious iridocyclitis, right eye: Secondary | ICD-10-CM | POA: Diagnosis not present

## 2024-01-06 DIAGNOSIS — H1011 Acute atopic conjunctivitis, right eye: Secondary | ICD-10-CM | POA: Diagnosis not present

## 2024-01-06 DIAGNOSIS — H20041 Secondary noninfectious iridocyclitis, right eye: Secondary | ICD-10-CM | POA: Diagnosis not present

## 2024-02-10 DIAGNOSIS — B0230 Zoster ocular disease, unspecified: Secondary | ICD-10-CM | POA: Diagnosis not present

## 2024-02-19 ENCOUNTER — Other Ambulatory Visit: Payer: Self-pay | Admitting: Nurse Practitioner

## 2024-02-19 ENCOUNTER — Other Ambulatory Visit: Payer: Self-pay | Admitting: Urology

## 2024-02-19 DIAGNOSIS — F17210 Nicotine dependence, cigarettes, uncomplicated: Secondary | ICD-10-CM

## 2024-02-19 DIAGNOSIS — I700 Atherosclerosis of aorta: Secondary | ICD-10-CM

## 2024-02-19 DIAGNOSIS — I10 Essential (primary) hypertension: Secondary | ICD-10-CM

## 2024-02-19 DIAGNOSIS — N138 Other obstructive and reflux uropathy: Secondary | ICD-10-CM

## 2024-03-03 ENCOUNTER — Other Ambulatory Visit: Payer: Self-pay | Admitting: *Deleted

## 2024-03-03 DIAGNOSIS — R972 Elevated prostate specific antigen [PSA]: Secondary | ICD-10-CM

## 2024-03-04 ENCOUNTER — Other Ambulatory Visit: Payer: Self-pay

## 2024-03-04 DIAGNOSIS — R972 Elevated prostate specific antigen [PSA]: Secondary | ICD-10-CM

## 2024-03-05 LAB — PSA: Prostate Specific Ag, Serum: 0.4 ng/mL (ref 0.0–4.0)

## 2024-03-09 NOTE — Progress Notes (Unsigned)
 03/11/2024 9:47 PM   Alex Bowers [birth year] [id number]  Referring provider: Liana Fish, NP [address] [place],  [place] [address]  Urological history: 1. High risk hematuria -smoker -CTU NED 2017 -cysto impressive intraluminal bilobar hypertrophy 2017  2. BPH with LU TS -PSA (02/2024) 0.4 -cysto (02/2023) normal prostate, but fairly narrow fossa, bilobar coaptation w/ slight intravesical protrusion of lateral lobes  -TRUS (02/2023) 15 cc prostate -UroLift (05/2023)  -managed with tadalafil  5 mg daily and finasteride  5 mg daily    3. Urgency -contributing factors of age, BPH, BP meds, smoking,  -failed OAB meds -deemed a poor candidate for Interstim and Botox -insurance would not cover PTNS -UDS (2024) showed an obstructive flow pattern but was also suggestive of possible pelvic floor dysfunction    4. ED -contributing factors of age, BPH, HTN, history of COVID, alcohol consumption and smoking  -tadalafil  5 mg daily   No chief complaint on file.  HPI: Alex Bowers is a 68 y.o. man who presents today for follow up.    Previous records reviewed.   He is a smoker.    PSA (02/2024) 0.4  Lab Results  Component Value Date   CREATININE 0.98 05/10/2023        PMH: Past Medical History:  Diagnosis Date   BPH (benign prostatic hyperplasia)    Depression    Dysplastic nevus 07/08/2014   Right proximal lateral deltoid. Sever atypia, lateral margin involved. Excised 09/07/2014, margins free.   Dysplastic nevus 11/16/2014   Right distal post. deltoid. Melanocytic hyperplasia and incidental dysplastic nevus with mild atypia, margins free. Excised.   Dysplastic nevus 07/03/2021   L lat buttocks - moderate   Dyspnea    Headache    younger   History of dysplastic nevus 05/18/2020   left anterior deltoid, severe / shave removal   Hyperlipidemia    Hypertension    Pelvic floor dysfunction    Personal history of tobacco use,  presenting hazards to health 12/29/2015   Stroke [facility]) 2014    Surgical History: Past Surgical History:  Procedure Laterality Date   COLONOSCOPY  2015   CYSTOSCOPY WITH INSERTION OF UROLIFT N/A 06/03/2023   Procedure: CYSTOSCOPY WITH INSERTION OF UROLIFT;  Surgeon: Penne Knee, MD;  Location: [facility] ORS;  Service: Urology;  Laterality: N/A;   HERNIA REPAIR Right 06/09/14   inguinal    teeth removal       Home Medications:  Allergies as of 03/11/2024   No Known Allergies      Medication List        Accurate as of March 09, 2024  9:47 PM. If you have any questions, ask your nurse or doctor.          amLODipine  5 MG tablet Commonly known as: NORVASC  TAKE 1 TABLET (5 MG TOTAL) BY MOUTH DAILY.   aspirin EC 81 MG tablet Take 81 mg by mouth daily.   buPROPion  300 MG 24 hr tablet Commonly known as: WELLBUTRIN  XL TAKE 1 TABLET BY MOUTH EVERY DAY   finasteride  5 MG tablet Commonly known as: PROSCAR  TAKE 1 TABLET (5 MG TOTAL) BY MOUTH DAILY.   lisinopril  40 MG tablet Commonly known as: ZESTRIL  TAKE 1 TABLET BY MOUTH EVERY DAY   meloxicam  15 MG tablet Commonly known as: MOBIC  Take 1 tablet (15 mg total) by mouth daily.   methocarbamol  500 MG tablet Commonly known as: ROBAXIN  TAKE 1 TABLET BY MOUTH THREE TIMES A DAY AS NEEDED FOR MUSCLE  SPASM   predniSONE  10 MG tablet Commonly known as: DELTASONE  Use as directed for 6 days   rosuvastatin  10 MG tablet Commonly known as: CRESTOR  TAKE 1 TABLET (10 MG TOTAL) BY MOUTH DAILY. FOR HIGH CHOLESTEROL   tadalafil  5 MG tablet Commonly known as: CIALIS  Take 1 tablet (5 mg total) by mouth daily as needed for erectile dysfunction.   Vitamin D  (Ergocalciferol ) 1.25 MG (50000 UNIT) Caps capsule Commonly known as: DRISDOL  TAKE 1 CAPSULE (50,000 UNITS TOTAL) BY MOUTH EVERY 7 (SEVEN) DAYS        Allergies: No Known Allergies  Family History: Family History  Problem Relation Age of Onset   Colon polyps Mother     Heart attack Father    Cancer - Other Brother        phageal   Prostate cancer Neg Hx    Kidney cancer Neg Hx    Bladder Cancer Neg Hx     Social History: See HPI for pertinent social history  ROS: Pertinent ROS in HPI  Physical Exam: There were no vitals taken for this visit.  Constitutional:  Well nourished. Alert and oriented, No acute distress. HEENT: [place] AT, moist mucus membranes.  Trachea midline, no masses. Cardiovascular: No clubbing, cyanosis, or edema. Respiratory: Normal respiratory effort, no increased work of breathing. GI: Abdomen is soft, non tender, non distended, no abdominal masses. Liver and spleen not palpable.  No hernias appreciated.  Stool sample for occult testing is not indicated.   GU: No CVA tenderness.  No bladder fullness or masses.  Patient with circumcised/uncircumcised phallus. ***Foreskin easily retracted***  Urethral meatus is patent.  No penile discharge. No penile lesions or rashes. Scrotum without lesions, cysts, rashes and/or edema.  Testicles are located scrotally bilaterally. No masses are appreciated in the testicles. Left and right epididymis are normal. Rectal: Patient with  normal sphincter tone. Anus and perineum without scarring or rashes. No rectal masses are appreciated. Prostate is approximately *** grams, *** nodules are appreciated. Seminal vesicles are normal. Skin: No rashes, bruises or suspicious lesions. Lymph: No cervical or inguinal adenopathy. Neurologic: Grossly intact, no focal deficits, moving all 4 extremities. Psychiatric: Normal mood and affect.  Laboratory Data: See EPIC and HPI  I have reviewed the labs.   Pertinent Imaging: ***  Assessment & Plan:  ***  1. High risk hematuria - smoker - work up (2017) NED - cysto (2024) NED  2. BPH with LU TS - PSA stable - continue tadalafil  5 mg daily and finasteride  5 mg daily   3. Pelvic floor dysfunction  -***  4. ED - ***  No follow-ups on file.  These notes  generated with voice recognition software. I apologize for typographical errors.  CLOTILDA HELON RIGGERS  [facility] [address]  [address] [place], [place] [address] [phone]

## 2024-03-11 ENCOUNTER — Ambulatory Visit (INDEPENDENT_AMBULATORY_CARE_PROVIDER_SITE_OTHER): Payer: Self-pay | Admitting: Urology

## 2024-03-11 ENCOUNTER — Encounter: Payer: Self-pay | Admitting: Urology

## 2024-03-11 VITALS — BP 135/88 | HR 76 | Ht 68.0 in | Wt 155.0 lb

## 2024-03-11 DIAGNOSIS — N529 Male erectile dysfunction, unspecified: Secondary | ICD-10-CM

## 2024-03-11 DIAGNOSIS — N138 Other obstructive and reflux uropathy: Secondary | ICD-10-CM | POA: Diagnosis not present

## 2024-03-11 DIAGNOSIS — M6289 Other specified disorders of muscle: Secondary | ICD-10-CM

## 2024-03-11 DIAGNOSIS — R319 Hematuria, unspecified: Secondary | ICD-10-CM

## 2024-03-11 DIAGNOSIS — N401 Enlarged prostate with lower urinary tract symptoms: Secondary | ICD-10-CM | POA: Diagnosis not present

## 2024-03-11 LAB — BLADDER SCAN AMB NON-IMAGING

## 2024-03-13 ENCOUNTER — Other Ambulatory Visit: Payer: Self-pay | Admitting: Nurse Practitioner

## 2024-03-13 DIAGNOSIS — E559 Vitamin D deficiency, unspecified: Secondary | ICD-10-CM

## 2024-03-25 ENCOUNTER — Other Ambulatory Visit: Payer: Self-pay | Admitting: Urology

## 2024-03-25 DIAGNOSIS — M6289 Other specified disorders of muscle: Secondary | ICD-10-CM

## 2024-03-30 DIAGNOSIS — B0230 Zoster ocular disease, unspecified: Secondary | ICD-10-CM | POA: Diagnosis not present

## 2024-04-07 DIAGNOSIS — I10 Essential (primary) hypertension: Secondary | ICD-10-CM | POA: Diagnosis not present

## 2024-04-07 DIAGNOSIS — E559 Vitamin D deficiency, unspecified: Secondary | ICD-10-CM | POA: Diagnosis not present

## 2024-04-07 DIAGNOSIS — E538 Deficiency of other specified B group vitamins: Secondary | ICD-10-CM | POA: Diagnosis not present

## 2024-04-07 DIAGNOSIS — I700 Atherosclerosis of aorta: Secondary | ICD-10-CM | POA: Diagnosis not present

## 2024-04-08 LAB — CBC WITH DIFFERENTIAL/PLATELET
Basophils Absolute: 0.1 x10E3/uL (ref 0.0–0.2)
Basos: 1 %
EOS (ABSOLUTE): 0.2 x10E3/uL (ref 0.0–0.4)
Eos: 2 %
Hematocrit: 40.4 % (ref 37.5–51.0)
Hemoglobin: 13.8 g/dL (ref 13.0–17.7)
Immature Grans (Abs): 0.1 x10E3/uL (ref 0.0–0.1)
Immature Granulocytes: 1 %
Lymphocytes Absolute: 2.2 x10E3/uL (ref 0.7–3.1)
Lymphs: 27 %
MCH: 36.1 pg — ABNORMAL HIGH (ref 26.6–33.0)
MCHC: 34.2 g/dL (ref 31.5–35.7)
MCV: 106 fL — ABNORMAL HIGH (ref 79–97)
Monocytes Absolute: 1.1 x10E3/uL — ABNORMAL HIGH (ref 0.1–0.9)
Monocytes: 13 %
Neutrophils Absolute: 4.6 x10E3/uL (ref 1.4–7.0)
Neutrophils: 56 %
Platelets: 280 x10E3/uL (ref 150–450)
RBC: 3.82 x10E6/uL — ABNORMAL LOW (ref 4.14–5.80)
RDW: 15.6 % — ABNORMAL HIGH (ref 11.6–15.4)
WBC: 8.2 x10E3/uL (ref 3.4–10.8)

## 2024-04-08 LAB — CMP14+EGFR
ALT: 29 IU/L (ref 0–44)
AST: 27 IU/L (ref 0–40)
Albumin: 4.6 g/dL (ref 3.9–4.9)
Alkaline Phosphatase: 61 IU/L (ref 44–121)
BUN/Creatinine Ratio: 13 (ref 10–24)
BUN: 14 mg/dL (ref 8–27)
Bilirubin Total: 0.8 mg/dL (ref 0.0–1.2)
CO2: 24 mmol/L (ref 20–29)
Calcium: 9.7 mg/dL (ref 8.6–10.2)
Chloride: 99 mmol/L (ref 96–106)
Creatinine, Ser: 1.1 mg/dL (ref 0.76–1.27)
Globulin, Total: 2.2 g/dL (ref 1.5–4.5)
Glucose: 103 mg/dL — ABNORMAL HIGH (ref 70–99)
Potassium: 5.9 mmol/L — ABNORMAL HIGH (ref 3.5–5.2)
Sodium: 134 mmol/L (ref 134–144)
Total Protein: 6.8 g/dL (ref 6.0–8.5)
eGFR: 74 mL/min/1.73 (ref >59)

## 2024-04-08 LAB — LIPID PANEL
Chol/HDL Ratio: 2 ratio (ref 0.0–5.0)
Cholesterol, Total: 154 mg/dL (ref 100–199)
HDL: 76 mg/dL (ref >39)
LDL Chol Calc (NIH): 66 mg/dL (ref 0–99)
Triglycerides: 59 mg/dL (ref 0–149)
VLDL Cholesterol Cal: 12 mg/dL (ref 5–40)

## 2024-04-08 LAB — B12 AND FOLATE PANEL
Folate: 6.3 ng/mL (ref >3.0)
Vitamin B-12: 610 pg/mL (ref 232–1245)

## 2024-04-08 LAB — VITAMIN D 25 HYDROXY (VIT D DEFICIENCY, FRACTURES): Vit D, 25-Hydroxy: 76.1 ng/mL (ref 30.0–100.0)

## 2024-04-10 ENCOUNTER — Other Ambulatory Visit: Payer: Self-pay | Admitting: Nurse Practitioner

## 2024-04-10 DIAGNOSIS — I10 Essential (primary) hypertension: Secondary | ICD-10-CM

## 2024-04-16 ENCOUNTER — Other Ambulatory Visit: Payer: Self-pay | Admitting: Nurse Practitioner

## 2024-04-16 ENCOUNTER — Encounter: Payer: Self-pay | Admitting: Physical Therapy

## 2024-04-16 ENCOUNTER — Ambulatory Visit: Attending: Urology | Admitting: Physical Therapy

## 2024-04-16 DIAGNOSIS — R2689 Other abnormalities of gait and mobility: Secondary | ICD-10-CM | POA: Diagnosis not present

## 2024-04-16 DIAGNOSIS — M533 Sacrococcygeal disorders, not elsewhere classified: Secondary | ICD-10-CM | POA: Diagnosis not present

## 2024-04-16 DIAGNOSIS — M6289 Other specified disorders of muscle: Secondary | ICD-10-CM | POA: Diagnosis not present

## 2024-04-16 DIAGNOSIS — E559 Vitamin D deficiency, unspecified: Secondary | ICD-10-CM

## 2024-04-16 NOTE — Therapy (Signed)
 OUTPATIENT PHYSICAL THERAPY EVALUATION   Patient Name: Alex Bowers MRN: [id number] DOB:Apr 25, 1956, 68 y.o., male Today's Date: 04/16/2024   PT End of Session - 04/16/24 1510     Visit Number 1    Number of Visits 10    Date for PT Re-Evaluation 06/25/24    PT Start Time 1505    PT Stop Time 1545    PT Time Calculation (min) 40 min    Activity Tolerance Patient tolerated treatment well;No increased pain    Behavior During Therapy [facility] for tasks assessed/performed          Past Medical History:  Diagnosis Date   BPH (benign prostatic hyperplasia)    Depression    Dysplastic nevus 07/08/2014   Right proximal lateral deltoid. Sever atypia, lateral margin involved. Excised 09/07/2014, margins free.   Dysplastic nevus 11/16/2014   Right distal post. deltoid. Melanocytic hyperplasia and incidental dysplastic nevus with mild atypia, margins free. Excised.   Dysplastic nevus 07/03/2021   L lat buttocks - moderate   Dyspnea    Headache    younger   History of dysplastic nevus 05/18/2020   left anterior deltoid, severe / shave removal   Hyperlipidemia    Hypertension    Pelvic floor dysfunction    Personal history of tobacco use, presenting hazards to health 12/29/2015   Stroke [facility]) 2014   Past Surgical History:  Procedure Laterality Date   COLONOSCOPY  2015   CYSTOSCOPY WITH INSERTION OF UROLIFT N/A 06/03/2023   Procedure: CYSTOSCOPY WITH INSERTION OF UROLIFT;  Surgeon: Penne Knee, MD;  Location: [facility] ORS;  Service: Urology;  Laterality: N/A;   HERNIA REPAIR Right 06/09/14   inguinal    teeth removal      Patient Active Problem List   Diagnosis Date Noted   Cigarette smoker 12/14/2019   Acute pain of right shoulder 04/19/2019   Dysuria 11/02/2018   Atherosclerosis of aorta (HCC) 04/14/2018   Decreased visual acuity 04/14/2018   Atopic dermatitis and related condition 10/27/2017   Essential hypertension 10/27/2017   Mixed hyperlipidemia 10/27/2017    Moderate recurrent major depression (HCC) 10/27/2017   Attention and concentration deficit 10/27/2017   Personal history of tobacco use, presenting hazards to health 12/29/2015   Erectile dysfunction 09/05/2015   Microscopic hematuria 09/05/2015   Hypogonadism in male 09/05/2015   Urinary frequency 09/05/2015   Right inguinal hernia 05/18/2014   Encounter for general adult medical examination with abnormal findings 05/18/2014    PCP: Liana Fish , NP   REFERRING PROVIDER: Helon Kirsch, PA-C   REFERRING DIAG: pelvic floor dsyfunction  Rationale for Evaluation and Treatment Rehabilitation  THERAPY DIAG:  Other abnormalities of gait and mobility  Sacrococcygeal disorders, not elsewhere classified  ONSET DATE:   SUBJECTIVE:         SUBJECTIVE STATEMENT  TODAY :  SUBJECTIVE STATEMENT on EVAL 04/16/24 : Pt had difficulty with starting urination, weak stream, straining to fully empty.     Prior to urolift:  straining across 50% of the time. After surgery cystoscopy with insertion of urolift 06/03/2023 : straining increased from 50% of the time to 85% of the time and worsened  to start urination.   Nocturia: 2 x night before and after surgery.    Starting stream takes 15 sec. If he drinks enough water, stream comes quickly.   Daily fluid intake:  2 cups of water, no juices, 12 fl oz soda, 2 cups of coffee,  ( 4) - 12 cans of beer  - drinks at night 10pm-1am   PERTINENT HISTORY:    PAIN:  Are you having pain? No   PRECAUTIONS: No   WEIGHT BEARING RESTRICTIONS:  No   FALLS:  Has patient fallen in last 6 months? No   LIVING ENVIRONMENT: Lives with: alone  Lives in: two story  Stairs: 3 STE with rail   OCCUPATION: retired after having having stroke , pt does not have hobbies  currently, cares for 4 cat, previous partner passed away, brother and father , sister, mother passed away. Pt feels depressed and not motivated to do much anymore. Pt would like to find work .   PLOF: Independent  PATIENT GOALS:     OBJECTIVE:    [facility] PT Assessment - 04/16/24 1511       Palpation   SI assessment  R shoulderr/ iliac crest lowered      Ambulation/Gait   Gait Comments 1.09 m/s , decreased stance on R, minimal posterior rotation of R thorax,                HOME EXERCISE PROGRAM: See pt instruction section    ASSESSMENT:  CLINICAL IMPRESSION: Pt is a 68  yo who presents with difficulty with starting and completing urination, straining, and weak stream  which impact QOL, ADL activities. After surgery cystoscopy with insertion of urolift 06/03/2023 : straining increased from 50% of the time to 85% of the time and worsened  to start urination.   Pt's musculoskeletal assessment revealed uneven pelvic girdle and shoulder height, asymmetries to gait pattern, limited spinal /pelvic mobility, dyscoordination and strength of pelvic floor mm, hip weakness, poor body mechanics which places strain on the abdominal/pelvic floor mm. These are deficits that indicate an ineffective intraabdominal pressure system associated with increased risk for pt's Sx.     Pt will benefit from propioception/ coordination/ body mechanics training and education with gravity-loaded tasks at work and home and  fitness modifications in order to gain a more effective intraabdominal pressure system to minimize Sx. Advised pt to not perform sit-ups and crunches as these movement patterns lead to more downward forces on the pelvic floor, negatively impacting abdominopelvic/spinal dysfunctions.   Pt was provided education on etiology of Sx with anatomy, physiology explanation with images along with the benefits of customized pelvic PT Tx based on pt's medical conditions and musculoskeletal deficits.   Explained the physiology of deep core mm coordination and roles of pelvic floor function in urination, defecation, sexual function, and postural control with deep core mm system, and the role of posture and alignment to help pelvic issues.   Regional interdependent approaches will yield greater benefits in pt's POC   Following Tx today which pt tolerated without complaints,  pt demo'd proper body mechanics to minimize straining pelvic floor. Discussed increasing water intake, decreasing bladder irritants (  coffee, soda, and alcohol) and provided information on bladder irritants.  Plan to address realignment of spine/ pelvis at next session to help promote optimize IAP system for improved pelvic floor function, trunk stability, gait, balance, stabilization with mobility tasks.  Plan to address pelvic floor issues once pelvis and spine are realigned to yield better outcomes.                                                     Pt benefits from skilled PT.    OBJECTIVE IMPAIRMENTS decreased activity tolerance, decreased coordination, decreased endurance, decreased mobility, difficulty walking, decreased ROM, decreased strength, decreased safety awareness, hypomobility, increased muscle spasms, impaired flexibility, improper body mechanics, postural dysfunction, andscar restrictions   ACTIVITY LIMITATIONS  self-care,  sleep, home chores, work tasks    PARTICIPATION LIMITATIONS:  community    PERSONAL FACTORS   affecting patient's functional outcome:    REHAB POTENTIAL: Good   CLINICAL DECISION MAKING: Evolving/moderate complexity   EVALUATION COMPLEXITY: Moderate    PATIENT EDUCATION:    Education details: Showed pt anatomy images. Explained muscles attachments/ connection, physiology of deep core system/ spinal- thoracic-pelvis-lower kinetic chain as they relate to pt's presentation, Sx, and past Hx. Explained what and how these areas of deficits need to be restored to balance and function     See Therapeutic activity / neuromuscular re-education section  Answered pt's questions.   Person educated: Patient Education method: Explanation, Demonstration, Tactile cues, Verbal cues, and Handouts Education comprehension: verbalized understanding, returned demonstration, verbal cues required, tactile cues required, and needs further education     PLAN: PT FREQUENCY: 1x/week   PT DURATION: 10 weeks   PLANNED INTERVENTIONS:   Gait training;Stair training;Functional mobility training;DME Instruction;Therapeutic activities;Therapeutic exercise;Balance training;Neuromuscular re-education;Patient/family education;Vestibular;Visual/perceptual remediation/compensation;Passive range of motion;Moist Heat;Cryotherapy;Traction;Canalith Repostioning;Joint Manipulations;Manual lymph drainage;Manual techniques;Scar mobilization;Energy conservation;Dry needling;ADLs/Self Care Home Management;Biofeedback;Electrical Stimulation;Taping    PLAN FOR NEXT SESSION: See clinical impression for plan     GOALS: Goals reviewed with patient? Yes  SHORT TERM GOALS: Target date: 05/14/2024    Pt will demo IND with HEP                    Baseline: Not IND            Goal status: INITIAL   LONG TERM GOALS: Target date: 06/25/2024    1.Pt will demo proper deep core coordination without chest breathing and optimal excursion of diaphragm/pelvic floor in order to promote spinal stability and pelvic floor function  Baseline: dyscoordination Goal status: INITIAL  2.  Pt will demo proper body mechanics in against gravity tasks and ADLs  work tasks, fitness  to minimize straining pelvic floor / back    Baseline: not IND, improper form that places strain on pelvic floor  Goal status: INITIAL    3. Pt will demo increased gait speed > 1.3 m/s with reciprocal gait pattern, longer stride length  in order to ambulate safely in community and return to fitness routine  Baseline: 1.09 m/s , decreased stance on  R, minimal posterior rotation of R thorax,  Goal status: INITIAL    4. Pt will demo levelled pelvic girdle and shoulder height in order to progress to deep core strengthening HEP and restore mobility at spine, pelvis, gait, posture minimize falls, and improve balance  Baseline:  R shoulder/  R iliac crest  Goal status: INITIAL   5. Pt will improve IPSS   questionnaire to  5-10 pts  for total score ,  1-3 points for QOL  score change  to demo improved QOL   Baseline:  21 pts ( severe)  , QOL  3 ( mixed )  Goal status: INITIAL   6.   Pt will report:  increase water intake from 16 fl oz to > 32 floz  and decrease from 2 soda and coffees to 1 one of each   Straining to urinate occurs  < 50% of the time    Baseline:  Prior to urolift:  straining across 50% of the time. After surgery: straining 85% of the time and worse to start urination.  Daily fluid intake:  16 fl oz of water, no juices, 12 fl oz soda, 16 flo z of coffee,  48 fl oz beer  - drinks at night 10pm-1am  Water : bladder irritant ratio  :  16 fl oz to :   66  fl oz  bladder irritants  Goal status: INITIAL     Pia Lupe Plump, PT 04/16/2024, 3:19 PM

## 2024-04-16 NOTE — Patient Instructions (Signed)
    Currently:  Daily fluid intake:  16 fl oz of water, no juices, 12 fl oz soda, 16 flo z of coffee,  48 fl oz beer  - drinks at night 10pm-1am  Water : bladder irritant ratio  :  16 fl oz to :   66  fl oz  bladder irritants  Work on increasing water, decrease bladder irritant  increase water intake from 16 fl oz to > 32 floz  and decrease from 2 soda and coffees to 1 one of each   __ Avoid straining pelvic floor, abdominal muscles , spine  Use log rolling technique instead of getting out of bed with your neck or the sit-up  Log rolling into and out of bed Log rolling into and out of bed If getting out of bed on R side, Bent knees, scoot hips/ shoulder to L  Raise R arm completely overhead, rolling onto armpit  Then lower bent knees to bed to get into complete side lying position  Then drop legs off bed, and push up onto R elbow/forearm, and use L hand to push onto the bed __ Proper body mechanics with getting out of a chair to decrease strain  on back &pelvic floor   Avoid holding your breath when Getting out of the chair:  Scoot to front part of chair chair Heels behind knees, feet are hip width apart, nose over toes  Inhale like you are smelling roses Exhale to stand  ___  Sitting with feet on ground, four points of contact Catch yourself crossing ankles and thighs

## 2024-04-18 ENCOUNTER — Other Ambulatory Visit: Payer: Self-pay | Admitting: Nurse Practitioner

## 2024-04-18 DIAGNOSIS — E559 Vitamin D deficiency, unspecified: Secondary | ICD-10-CM

## 2024-04-23 ENCOUNTER — Ambulatory Visit: Attending: Urology | Admitting: Physical Therapy

## 2024-04-23 DIAGNOSIS — R2689 Other abnormalities of gait and mobility: Secondary | ICD-10-CM | POA: Insufficient documentation

## 2024-04-23 DIAGNOSIS — M533 Sacrococcygeal disorders, not elsewhere classified: Secondary | ICD-10-CM | POA: Insufficient documentation

## 2024-04-30 ENCOUNTER — Ambulatory Visit: Payer: Self-pay | Admitting: Nurse Practitioner

## 2024-04-30 ENCOUNTER — Ambulatory Visit: Admitting: Physical Therapy

## 2024-04-30 DIAGNOSIS — M533 Sacrococcygeal disorders, not elsewhere classified: Secondary | ICD-10-CM | POA: Diagnosis not present

## 2024-04-30 DIAGNOSIS — R2689 Other abnormalities of gait and mobility: Secondary | ICD-10-CM | POA: Diagnosis not present

## 2024-04-30 NOTE — Therapy (Signed)
 OUTPATIENT PHYSICAL THERAPY TREATMENT    Patient Name: Alex Bowers MRN: [id number] DOB:03/13/1956, 68 y.o., male Today's Date: 04/30/2024   PT End of Session - 04/30/24 1511     Visit Number 2    Number of Visits 10    Date for PT Re-Evaluation 06/25/24    PT Start Time 1508    PT Stop Time 1550    PT Time Calculation (min) 42 min    Activity Tolerance Patient tolerated treatment well;No increased pain    Behavior During Therapy [facility] for tasks assessed/performed          Past Medical History:  Diagnosis Date   BPH (benign prostatic hyperplasia)    Depression    Dysplastic nevus 07/08/2014   Right proximal lateral deltoid. Sever atypia, lateral margin involved. Excised 09/07/2014, margins free.   Dysplastic nevus 11/16/2014   Right distal post. deltoid. Melanocytic hyperplasia and incidental dysplastic nevus with mild atypia, margins free. Excised.   Dysplastic nevus 07/03/2021   L lat buttocks - moderate   Dyspnea    Headache    younger   History of dysplastic nevus 05/18/2020   left anterior deltoid, severe / shave removal   Hyperlipidemia    Hypertension    Pelvic floor dysfunction    Personal history of tobacco use, presenting hazards to health 12/29/2015   Stroke [facility]) 2014   Past Surgical History:  Procedure Laterality Date   COLONOSCOPY  2015   CYSTOSCOPY WITH INSERTION OF UROLIFT N/A 06/03/2023   Procedure: CYSTOSCOPY WITH INSERTION OF UROLIFT;  Surgeon: Penne Knee, MD;  Location: [facility] ORS;  Service: Urology;  Laterality: N/A;   HERNIA REPAIR Right 06/09/14   inguinal    teeth removal      Patient Active Problem List   Diagnosis Date Noted   Cigarette smoker 12/14/2019   Acute pain of right shoulder 04/19/2019   Dysuria 11/02/2018   Atherosclerosis of aorta (HCC) 04/14/2018   Decreased visual acuity 04/14/2018   Atopic dermatitis and related condition 10/27/2017   Essential hypertension 10/27/2017   Mixed hyperlipidemia 10/27/2017    Moderate recurrent major depression (HCC) 10/27/2017   Attention and concentration deficit 10/27/2017   Personal history of tobacco use, presenting hazards to health 12/29/2015   Erectile dysfunction 09/05/2015   Microscopic hematuria 09/05/2015   Hypogonadism in male 09/05/2015   Urinary frequency 09/05/2015   Right inguinal hernia 05/18/2014   Encounter for general adult medical examination with abnormal findings 05/18/2014    PCP: Liana Fish , NP   REFERRING PROVIDER: Helon Kirsch, PA-C   REFERRING DIAG: pelvic floor dsyfunction  Rationale for Evaluation and Treatment Rehabilitation  THERAPY DIAG:  Other abnormalities of gait and mobility  Sacrococcygeal disorders, not elsewhere classified  ONSET DATE:   SUBJECTIVE:         SUBJECTIVE STATEMENT  TODAY :                                             Pt has been noticing when he crosses his legs and correcting it  SUBJECTIVE STATEMENT on EVAL 04/16/24 : Pt had difficulty with starting urination, weak stream, straining to fully empty.     Prior to urolift:  straining across 50% of the time. After surgery cystoscopy with insertion of urolift 06/03/2023 : straining increased from 50% of the time to 85% of the time and worsened  to start urination.   Nocturia: 2 x night before and after surgery.    Starting stream takes 15 sec. If he drinks enough water, stream comes quickly.   Daily fluid intake:  2 cups of water, no juices, 12 fl oz soda, 2 cups of coffee,  ( 4) - 12 cans of beer  - drinks at night 10pm-1am   PERTINENT HISTORY:    PAIN:  Are you having pain? No   PRECAUTIONS: No   WEIGHT BEARING RESTRICTIONS:  No   FALLS:  Has patient fallen in last 6 months? No   LIVING ENVIRONMENT: Lives with: alone  Lives in: two story  Stairs: 3 STE with rail   OCCUPATION:  retired after having having stroke , pt does not have hobbies currently, cares for 4 cat, previous partner passed away, brother and father , sister, mother passed away. Pt feels depressed and not motivated to do much anymore. Pt would like to find work .   PLOF: Independent  PATIENT GOALS:     OBJECTIVE:   Pelvic Floor Special Questions - 04/30/24 1608     Diastasis Recti 2 fingers below sternum, 3 fingers width above umbilicus, 1 fingers below umbilicus          [facility] PT Assessment - 04/30/24 1612       Observation/Other Assessments   Observations rounded shoulders, forward  head      Palpation   SI assessment  R shoulderr/ iliac crest lowered  ( post Tx: levelled pelvis, R shoulder still lowered)      Bed Mobility   Bed Mobility --   difficulty with motor planning with sit <> supine, logrolling, scooting in bed           [facility] Adult PT Treatment/Exercise - 04/30/24 1608       Self-Care   Self-Care Other Self-Care Comments    Other Self-Care Comments  explained the role of posture and helping DRA for improved IAP function , showed anatomy and explained role of diaphragm for helping pelvic floor and with upright posture      Therapeutic Activites    Therapeutic Activities Other Therapeutic Activities    Other Therapeutic Activities practiced multiple sets of sit <> supine, scooting in bed and explained importance of not strianing with head lifting and worsening bladder isues and DRA      Neuro Re-ed    Neuro Re-ed Details  cued for thoracic mobility HEP for technique and alignment, cued for propicoeption and sequence of motor planning with sit <> supine without straining pelvic floor through logrolling technique               HOME EXERCISE PROGRAM: See pt instruction section    ASSESSMENT:  CLINICAL IMPRESSION:   Added midback stretches to promote thoracic mobility and realignment of spine and pelvis and optimize diaphragm / pelvic floor function.  Pt  demo'd levelled pelvis post stretches but shoulder was still lowered on R .  Plan to realignment of spine at next session Pt was provided education on etiology of Sx with anatomy, physiology explanation with images to explain role of upright posture and diaphragm and improving diastasis recti.  Following Tx today which pt tolerated without complaints,  pt demo'd proper body mechanics to minimize straining pelvic floor with repeated practice with sit <> supine through logrolling and scooting method without head lift to minimize worsening of incontinence and diastasis recti. Pt showed good carry over with practice and training on cues for propioception and technique.     These improvements will  help promote optimize IAP system for improved pelvic floor function, trunk stability, gait, balance, stabilization with mobility tasks.  Plan to address pelvic floor issues once pelvis and spine are realigned to yield better outcomes.     Regional interdependent approaches will yield greater benefits in pt's POC                                                     Pt benefits from skilled PT.    OBJECTIVE IMPAIRMENTS decreased activity tolerance, decreased coordination, decreased endurance, decreased mobility, difficulty walking, decreased ROM, decreased strength, decreased safety awareness, hypomobility, increased muscle spasms, impaired flexibility, improper body mechanics, postural dysfunction, andscar restrictions   ACTIVITY LIMITATIONS  self-care,  sleep, home chores, work tasks    PARTICIPATION LIMITATIONS:  community    PERSONAL FACTORS   affecting patient's functional outcome:    REHAB POTENTIAL: Good   CLINICAL DECISION MAKING: Evolving/moderate complexity   EVALUATION COMPLEXITY: Moderate    PATIENT EDUCATION:    Education details: Showed pt anatomy images. Explained muscles attachments/ connection, physiology of deep core system/ spinal- thoracic-pelvis-lower kinetic chain as they  relate to pt's presentation, Sx, and past Hx. Explained what and how these areas of deficits need to be restored to balance and function    See Therapeutic activity / neuromuscular re-education section  Answered pt's questions.   Person educated: Patient Education method: Explanation, Demonstration, Tactile cues, Verbal cues, and Handouts Education comprehension: verbalized understanding, returned demonstration, verbal cues required, tactile cues required, and needs further education     PLAN: PT FREQUENCY: 1x/week   PT DURATION: 10 weeks   PLANNED INTERVENTIONS:   Gait training;Stair training;Functional mobility training;DME Instruction;Therapeutic activities;Therapeutic exercise;Balance training;Neuromuscular re-education;Patient/family education;Vestibular;Visual/perceptual remediation/compensation;Passive range of motion;Moist Heat;Cryotherapy;Traction;Canalith Repostioning;Joint Manipulations;Manual lymph drainage;Manual techniques;Scar mobilization;Energy conservation;Dry needling;ADLs/Self Care Home Management;Biofeedback;Electrical Stimulation;Taping    PLAN FOR NEXT SESSION: See clinical impression for plan     GOALS: Goals reviewed with patient? Yes  SHORT TERM GOALS: Target date: 05/14/2024    Pt will demo IND with HEP                    Baseline: Not IND            Goal status: INITIAL   LONG TERM GOALS: Target date: 06/25/2024    1.Pt will demo proper deep core coordination without chest breathing and optimal excursion of diaphragm/pelvic floor in order to promote spinal stability and pelvic floor function  Baseline: dyscoordination Goal status: INITIAL  2.  Pt will demo proper body mechanics in against gravity tasks and ADLs  work tasks, fitness  to minimize straining pelvic floor / back    Baseline: not IND, improper form that places strain on pelvic floor  Goal status: INITIAL    3. Pt will demo increased gait speed > 1.3 m/s with reciprocal gait  pattern, longer stride length  in order to ambulate safely in community and return to fitness routine  Baseline:  1.09 m/s , decreased stance on R, minimal posterior rotation of R thorax,  Goal status: INITIAL    4. Pt will demo levelled pelvic girdle and shoulder height in order to progress to deep core strengthening HEP and restore mobility at spine, pelvis, gait, posture minimize falls, and improve balance  Baseline:  R shoulder/ R iliac crest  Goal status: INITIAL   5. Pt will improve IPSS   questionnaire to  5-10 pts  for total score ,  1-3 points for QOL  score change  to demo improved QOL   Baseline:  21 pts ( severe)  , QOL  3 ( mixed )  Goal status: INITIAL   6.   Pt will report:  increase water intake from 16 fl oz to > 32 floz  and decrease from 2 soda and coffees to 1 one of each   Straining to urinate occurs  < 50% of the time    Baseline:  Prior to urolift:  straining across 50% of the time. After surgery: straining 85% of the time and worse to start urination.  Daily fluid intake:  16 fl oz of water, no juices, 12 fl oz soda, 16 flo z of coffee,  48 fl oz beer  - drinks at night 10pm-1am  Water : bladder irritant ratio  :  16 fl oz to :   66  fl oz  bladder irritants  Goal status: INITIAL   7.  Pt will demo decreased separation at rectus abdominus < 1-2 fingers width along linea alba to improve IAP function for continence  Baseline: 2 fingers below sternum, 3 fingers width above umbilicus, 1 fingers below umbilicus  Goal status: INITIAL  Pia Lupe Plump, PT 04/30/2024, 4:13 PM

## 2024-04-30 NOTE — Progress Notes (Signed)
 We will review the labs at his upcoming office visit

## 2024-04-30 NOTE — Patient Instructions (Signed)
  Avoid straining pelvic floor, abdominal muscles , spine  Use log rolling technique instead of getting out of bed with your neck or the sit-up     Log rolling into and out of bed   Log rolling into and out of bed If getting out of bed on R side, Bent knees, scoot hips/ shoulder to L  Raise R arm completely overhead, rolling onto armpit  Then lower bent knees to bed to get into complete side lying position  Then drop legs off bed, and push up onto R elbow/forearm, and use L hand to push onto the bed    Dig elbows and feet to lift hte buttocks and scoot without lifting head   __   Lengthen Back rib by L  shoulder ( winging)  Lie on R  side , pillow between knees and under head  Pull  L arm overhead over mattress, grab the edge of mattress,pull it upward, drawing elbow away from ears  Breathing 10 reps Brushing arm with 3/4 turn onto pillow behind back  Lying on R  side ,Pillow/ Block between knees  dragging top forearm across ribs below breast rotating 3/4 turn,  rotating  _L_ only this week ,  relax onto the pillow behind the back  and then back to other palm , maintain top palm on body whole top and not lift shoulder Do this side this week       Wait do both sides until we have levelled out your spine and shoulders ___ Lengthen Back rib by R  shoulder   ( winging)  Lie on L  side , pillow between knees and under head  Pull  R arm overhead over mattress, grab the edge of mattress,pull it upward, drawing elbow away from ears  Breathing 10 reps Brushing arm with 3/4 turn onto pillow behind back  Lying on L  side ,Pillow/ Block between knees  dragging top forearm across ribs below breast rotating 3/4 turn,  rotating  _R_ only this week ,  relax onto the pillow behind the back  and then back to other palm , maintain top palm on body whole top and not lift shoulder Do this side this week       Wait do both sides until we have levelled out your spine

## 2024-05-05 ENCOUNTER — Ambulatory Visit: Admitting: Physical Therapy

## 2024-05-07 ENCOUNTER — Ambulatory Visit: Payer: 59 | Admitting: Nurse Practitioner

## 2024-05-07 ENCOUNTER — Encounter: Payer: Self-pay | Admitting: Nurse Practitioner

## 2024-05-07 VITALS — BP 136/84 | HR 65 | Temp 98.3°F | Resp 16 | Ht 68.0 in | Wt 146.0 lb

## 2024-05-07 DIAGNOSIS — E875 Hyperkalemia: Secondary | ICD-10-CM

## 2024-05-07 DIAGNOSIS — F331 Major depressive disorder, recurrent, moderate: Secondary | ICD-10-CM

## 2024-05-07 DIAGNOSIS — Z1212 Encounter for screening for malignant neoplasm of rectum: Secondary | ICD-10-CM | POA: Diagnosis not present

## 2024-05-07 DIAGNOSIS — R600 Localized edema: Secondary | ICD-10-CM

## 2024-05-07 DIAGNOSIS — G8929 Other chronic pain: Secondary | ICD-10-CM | POA: Diagnosis not present

## 2024-05-07 DIAGNOSIS — F17210 Nicotine dependence, cigarettes, uncomplicated: Secondary | ICD-10-CM

## 2024-05-07 DIAGNOSIS — M25511 Pain in right shoulder: Secondary | ICD-10-CM

## 2024-05-07 DIAGNOSIS — I10 Essential (primary) hypertension: Secondary | ICD-10-CM | POA: Diagnosis not present

## 2024-05-07 DIAGNOSIS — Z0001 Encounter for general adult medical examination with abnormal findings: Secondary | ICD-10-CM

## 2024-05-07 DIAGNOSIS — J432 Centrilobular emphysema: Secondary | ICD-10-CM

## 2024-05-07 DIAGNOSIS — Z1211 Encounter for screening for malignant neoplasm of colon: Secondary | ICD-10-CM

## 2024-05-07 DIAGNOSIS — I2510 Atherosclerotic heart disease of native coronary artery without angina pectoris: Secondary | ICD-10-CM | POA: Diagnosis not present

## 2024-05-07 DIAGNOSIS — E559 Vitamin D deficiency, unspecified: Secondary | ICD-10-CM | POA: Diagnosis not present

## 2024-05-07 DIAGNOSIS — I700 Atherosclerosis of aorta: Secondary | ICD-10-CM

## 2024-05-07 DIAGNOSIS — Z0000 Encounter for general adult medical examination without abnormal findings: Secondary | ICD-10-CM

## 2024-05-07 MED ORDER — MELOXICAM 15 MG PO TABS: 15.0000 mg | ORAL_TABLET | Freq: Every day | ORAL | 1 refills | Status: AC

## 2024-05-07 MED ORDER — BUPROPION HCL ER (XL) 300 MG PO TB24: 300.0000 mg | ORAL_TABLET | Freq: Every day | ORAL | 1 refills | Status: AC

## 2024-05-07 MED ORDER — HYDROCHLOROTHIAZIDE 12.5 MG PO CAPS: 12.5000 mg | ORAL_CAPSULE | Freq: Every day | ORAL | 1 refills | Status: AC

## 2024-05-07 MED ORDER — LISINOPRIL 40 MG PO TABS: 40.0000 mg | ORAL_TABLET | Freq: Every day | ORAL | 2 refills | Status: AC

## 2024-05-07 MED ORDER — VITAMIN D (ERGOCALCIFEROL) 1.25 MG (50000 UNIT) PO CAPS: 50000.0000 [IU] | ORAL_CAPSULE | ORAL | 0 refills | Status: DC

## 2024-05-07 MED ORDER — AMLODIPINE BESYLATE 5 MG PO TABS: 5.0000 mg | ORAL_TABLET | Freq: Every day | ORAL | 1 refills | Status: AC

## 2024-05-07 MED ORDER — ROSUVASTATIN CALCIUM 10 MG PO TABS
10.0000 mg | ORAL_TABLET | Freq: Every day | ORAL | 1 refills | Status: DC
Start: 2024-05-07 — End: 2024-07-14

## 2024-05-07 NOTE — Progress Notes (Signed)
 [facility] [address] [place], [place] [address]  Internal MEDICINE  Office Visit Note  Patient Name: Alex Bowers  [id number]  [id number]  Date of Service: 05/07/2024  Chief Complaint  Patient presents with   Depression   Hypertension   Hyperlipidemia   Medicare Wellness    HPI Alex Bowers presents for an annual well visit and physical exam.  Well-appearing 68 y.o. male with hypertension, 3-vessel CAD. Emphysema, depression, anxiety, high cholesterol on statin therapy Routine CRC screening: due now, opt for cologuard  Labs: labs done in August. Slightly elevated fasting glucose Slightly elevated creatinine at 1.10 Elevated potassium 5.9 Anemic with low RBC at 3.82 Elevated MCV, MCH and RDW PSA 0.4 in July.  Cholesterol panel is normal on statin therapy.  New or worsening pain: none  Other concerns: none      05/07/2024    1:46 PM 05/02/2023    2:06 PM  MMSE - Mini Mental State Exam  Orientation to time 5 5  Orientation to Place 5 5  Registration 3 3  Attention/ Calculation 5 5  Recall 3 3  Language- name 2 objects 2 2  Language- repeat 1 1  Language- follow 3 step command 3 3  Language- read & follow direction 1 1  Write a sentence 1 1  Copy design 1 1  Total score 30 30    Functional Status Survey: Is the patient deaf or have difficulty hearing?: No Does the patient have difficulty seeing, even when wearing glasses/contacts?: No Does the patient have difficulty concentrating, remembering, or making decisions?: Yes Does the patient have difficulty walking or climbing stairs?: No Does the patient have difficulty dressing or bathing?: No Does the patient have difficulty doing errands alone such as visiting a doctor's office or shopping?: No     04/26/2022    2:09 PM 10/25/2022    2:41 PM 05/02/2023    2:05 PM 10/31/2023    3:01 PM 05/07/2024    1:43 PM  Fall Risk  Falls in the past year? 0 0 0 0 0  Was there an injury with Fall?  0 0   0  Fall Risk Category Calculator  0 0  0  (RETIRED) Patient Fall Risk Level Low fall risk       Patient at Risk for Falls Due to No Fall Risks No Fall Risks No Fall Risks    Fall risk Follow up Falls evaluation completed  Falls evaluation completed Falls evaluation completed  Falls evaluation completed     Data saved with a previous flowsheet row definition       10/31/2023    3:01 PM  Depression screen PHQ 2/9  Decreased Interest 0  Down, Depressed, Hopeless 0  PHQ - 2 Score 0        Current Medication: Outpatient Encounter Medications as of 05/07/2024  Medication Sig   hydrochlorothiazide  (MICROZIDE ) 12.5 MG capsule Take 1 capsule (12.5 mg total) by mouth daily.   amLODipine  (NORVASC ) 5 MG tablet Take 1 tablet (5 mg total) by mouth daily.   aspirin EC 81 MG tablet Take 81 mg by mouth daily.   buPROPion  (WELLBUTRIN  XL) 300 MG 24 hr tablet Take 1 tablet (300 mg total) by mouth daily.   lisinopril  (ZESTRIL ) 40 MG tablet Take 1 tablet (40 mg total) by mouth daily.   meloxicam  (MOBIC ) 15 MG tablet Take 1 tablet (15 mg total) by mouth daily.   rosuvastatin  (CRESTOR ) 10 MG tablet Take 1 tablet (10  mg total) by mouth daily. For high cholesterol   tadalafil  (CIALIS ) 5 MG tablet Take 1 tablet (5 mg total) by mouth daily as needed for erectile dysfunction.   Vitamin D , Ergocalciferol , (DRISDOL ) 1.25 MG (50000 UNIT) CAPS capsule Take 1 capsule (50,000 Units total) by mouth every 7 (seven) days.   [DISCONTINUED] amLODipine  (NORVASC ) 5 MG tablet TAKE 1 TABLET (5 MG TOTAL) BY MOUTH DAILY.   [DISCONTINUED] buPROPion  (WELLBUTRIN  XL) 300 MG 24 hr tablet TAKE 1 TABLET BY MOUTH EVERY DAY   [DISCONTINUED] finasteride  (PROSCAR ) 5 MG tablet Take 1 tablet (5 mg total) by mouth daily.   [DISCONTINUED] lisinopril  (ZESTRIL ) 40 MG tablet Take 40 mg by mouth daily.   [DISCONTINUED] meloxicam  (MOBIC ) 15 MG tablet Take 1 tablet (15 mg total) by mouth daily.   [DISCONTINUED] methocarbamol  (ROBAXIN ) 500 MG  tablet TAKE 1 TABLET BY MOUTH THREE TIMES A DAY AS NEEDED FOR MUSCLE SPASM   [DISCONTINUED] rosuvastatin  (CRESTOR ) 10 MG tablet TAKE 1 TABLET (10 MG TOTAL) BY MOUTH DAILY. FOR HIGH CHOLESTEROL   [DISCONTINUED] Vitamin D , Ergocalciferol , (DRISDOL ) 1.25 MG (50000 UNIT) CAPS capsule TAKE 1 CAPSULE (50,000 UNITS TOTAL) BY MOUTH EVERY 7 (SEVEN) DAYS   No facility-administered encounter medications on file as of 05/07/2024.    Surgical History: Past Surgical History:  Procedure Laterality Date   COLONOSCOPY  2015   CYSTOSCOPY WITH INSERTION OF UROLIFT N/A 06/03/2023   Procedure: CYSTOSCOPY WITH INSERTION OF UROLIFT;  Surgeon: Penne Knee, MD;  Location: [facility] ORS;  Service: Urology;  Laterality: N/A;   HERNIA REPAIR Right 06/09/14   inguinal    teeth removal       Medical History: Past Medical History:  Diagnosis Date   BPH (benign prostatic hyperplasia)    Depression    Dysplastic nevus 07/08/2014   Right proximal lateral deltoid. Sever atypia, lateral margin involved. Excised 09/07/2014, margins free.   Dysplastic nevus 11/16/2014   Right distal post. deltoid. Melanocytic hyperplasia and incidental dysplastic nevus with mild atypia, margins free. Excised.   Dysplastic nevus 07/03/2021   L lat buttocks - moderate   Dyspnea    Headache    younger   History of dysplastic nevus 05/18/2020   left anterior deltoid, severe / shave removal   Hyperlipidemia    Hypertension    Pelvic floor dysfunction    Personal history of tobacco use, presenting hazards to health 12/29/2015   Stroke [facility]) 2014    Family History: Family History  Problem Relation Age of Onset   Colon polyps Mother    Heart attack Father    Cancer - Other Brother        phageal   Prostate cancer Neg Hx    Kidney cancer Neg Hx    Bladder Cancer Neg Hx     Social History   Socioeconomic History   Marital status: Divorced    Spouse name: Not on file   Number of children: Not on file   Years of education: Not  on file   Highest education level: Not on file  Occupational History   Not on file  Tobacco Use   Smoking status: Every Day    Types: Pipe    Start date: 02/19/2022   Smokeless tobacco: Never   Tobacco comments:    No more smoking only vaping now 10/31/23.  Vaping Use   Vaping status: Every Day   Substances: Nicotine, Flavoring  Substance and Sexual Activity   Alcohol use: Yes    Comment: occasionally  Drug use: No   Sexual activity: Not on file  Other Topics Concern   Not on file  Social History Narrative   Not on file   Social Drivers of Health   Financial Resource Strain: Not on file  Food Insecurity: Not on file  Transportation Needs: Not on file  Physical Activity: Not on file  Stress: Not on file  Social Connections: Not on file  Intimate Partner Violence: Not on file      Review of Systems  Constitutional:  Negative for activity change, appetite change, chills, fatigue, fever and unexpected weight change.  HENT: Negative.  Negative for congestion, ear pain, rhinorrhea, sore throat and trouble swallowing.   Eyes: Negative.   Respiratory: Negative.  Negative for cough, chest tightness, shortness of breath and wheezing.   Cardiovascular: Negative.  Negative for chest pain and palpitations.  Gastrointestinal: Negative.  Negative for abdominal pain, blood in stool, constipation, diarrhea, nausea and vomiting.  Endocrine: Negative.   Genitourinary: Negative.  Negative for difficulty urinating, dysuria, frequency, hematuria and urgency.  Musculoskeletal: Negative.  Negative for arthralgias, back pain, joint swelling, myalgias and neck pain.  Skin: Negative.  Negative for rash and wound.  Allergic/Immunologic: Negative.  Negative for immunocompromised state.  Neurological: Negative.  Negative for dizziness, seizures, numbness and headaches.  Hematological: Negative.   Psychiatric/Behavioral: Negative.  Negative for behavioral problems, self-injury and suicidal ideas.  The patient is not nervous/anxious.     Vital Signs: BP 136/84   Pulse 65   Temp 98.3 F (36.8 C)   Resp 16   Ht 5' 8 (1.727 m)   Wt 146 lb (66.2 kg)   SpO2 99%   BMI 22.20 kg/m    Physical Exam Vitals reviewed.  Constitutional:      General: He is awake. He is not in acute distress.    Appearance: Normal appearance. He is well-developed, well-groomed and normal weight. He is not ill-appearing or diaphoretic.  HENT:     Head: Normocephalic and atraumatic.     Right Ear: Tympanic membrane, ear canal and external ear normal.     Left Ear: Tympanic membrane, ear canal and external ear normal.     Nose: Nose normal. No congestion or rhinorrhea.     Mouth/Throat:     Lips: Pink.     Mouth: Mucous membranes are moist.     Pharynx: Oropharynx is clear. Uvula midline. No oropharyngeal exudate.  Eyes:     General: Lids are normal. Vision grossly intact. Gaze aligned appropriately. No scleral icterus.       Right eye: No discharge.        Left eye: No discharge.     Extraocular Movements: Extraocular movements intact.     Conjunctiva/sclera: Conjunctivae normal.     Pupils: Pupils are equal, round, and reactive to light.     Funduscopic exam:    Right eye: Red reflex present.        Left eye: Red reflex present. Neck:     Thyroid: No thyromegaly.     Vascular: No carotid bruit or JVD.     Trachea: Trachea and phonation normal. No tracheal deviation.  Cardiovascular:     Rate and Rhythm: Normal rate and regular rhythm.     Pulses:          Carotid pulses are 3+ on the right side and 3+ on the left side.      Radial pulses are 2+ on the right side and 2+ on the  left side.       Posterior tibial pulses are 2+ on the right side and 2+ on the left side.     Heart sounds: Normal heart sounds, S1 normal and S2 normal. No murmur heard.    No friction rub. No gallop.  Pulmonary:     Effort: Pulmonary effort is normal. No respiratory distress.     Breath sounds: Normal breath  sounds. No stridor. No wheezing or rales.  Chest:     Chest wall: No tenderness.  Abdominal:     General: Bowel sounds are normal. There is no distension.     Palpations: Abdomen is soft. There is no mass.     Tenderness: There is no abdominal tenderness. There is no guarding or rebound.  Musculoskeletal:        General: No deformity.     Cervical back: Neck supple. No tenderness.     Right lower leg: No edema.     Left lower leg: No edema.  Lymphadenopathy:     Cervical: No cervical adenopathy.  Skin:    General: Skin is warm and dry.     Capillary Refill: Capillary refill takes less than 2 seconds.     Coloration: Skin is not pale.     Findings: No erythema or rash.  Neurological:     Mental Status: He is alert and oriented to person, place, and time.     Cranial Nerves: No cranial nerve deficit.     Motor: No abnormal muscle tone.     Coordination: Coordination normal.     Gait: Gait normal.     Deep Tendon Reflexes: Reflexes are normal and symmetric.  Psychiatric:        Mood and Affect: Mood normal.        Behavior: Behavior normal. Behavior is cooperative.        Thought Content: Thought content normal.        Judgment: Judgment normal.        Assessment/Plan: 1. Encounter for Medicare annual examination with abnormal findings (Primary) Age-appropriate preventive screenings and vaccinations discussed, annual physical exam completed. Routine labs for health maintenance ordered (if needed). PHM updated.    2. 3-vessel CAD Continue rosuvastatin  as prescribed.  - rosuvastatin  (CRESTOR ) 10 MG tablet; Take 1 tablet (10 mg total) by mouth daily. For high cholesterol  Dispense: 90 tablet; Refill: 1  3. Centrilobular emphysema (HCC) Due for PFT  Not currently on any maintenance inhaler, will review PFT and determine if an inhaler is needed.  - Pulmonary function test; Future  4. Essential hypertension Stable, continue amlodipine  and lisinopril  as prescribed and add  hydrochlorothiazide  as prescribed.  - amLODipine  (NORVASC ) 5 MG tablet; Take 1 tablet (5 mg total) by mouth daily.  Dispense: 90 tablet; Refill: 1 - lisinopril  (ZESTRIL ) 40 MG tablet; Take 1 tablet (40 mg total) by mouth daily.  Dispense: 90 tablet; Refill: 2 - hydrochlorothiazide  (MICROZIDE ) 12.5 MG capsule; Take 1 capsule (12.5 mg total) by mouth daily.  Dispense: 90 capsule; Refill: 1  5. Bilateral lower extremity edema Add hydrochlorothiazide  as prescribed. - hydrochlorothiazide  (MICROZIDE ) 12.5 MG capsule; Take 1 capsule (12.5 mg total) by mouth daily.  Dispense: 90 capsule; Refill: 1  6. Chronic right shoulder pain Continue meloxicam  as prescribed  - meloxicam  (MOBIC ) 15 MG tablet; Take 1 tablet (15 mg total) by mouth daily.  Dispense: 90 tablet; Refill: 1  7. Vitamin D  deficiency Continue vitamin D  supplement as prescribed.  - Vitamin D , Ergocalciferol , (DRISDOL )  1.25 MG (50000 UNIT) CAPS capsule; Take 1 capsule (50,000 Units total) by mouth every 7 (seven) days.  Dispense: 12 capsule; Refill: 0  8. Screening for colorectal cancer Cologuard test ordered  - Cologuard  9. Cigarette nicotine dependence without complication Continue bupropion  as prescribed. Will need lung cancer screening soon.  - buPROPion  (WELLBUTRIN  XL) 300 MG 24 hr tablet; Take 1 tablet (300 mg total) by mouth daily.  Dispense: 90 tablet; Refill: 1  10. Moderate recurrent major depression (HCC) Continue bupropion  as prescribed.  - buPROPion  (WELLBUTRIN  XL) 300 MG 24 hr tablet; Take 1 tablet (300 mg total) by mouth daily.  Dispense: 90 tablet; Refill: 1  11. Hyperkalemia Add hydrochlorothiazide      General Counseling: Alex Bowers verbalizes understanding of the findings of todays visit and agrees with plan of treatment. I have discussed any further diagnostic evaluation that may be needed or ordered today. We also reviewed his medications today. he has been encouraged to call the office with any questions or  concerns that should arise related to todays visit.    Orders Placed This Encounter  Procedures   Cologuard    Meds ordered this encounter  Medications   amLODipine  (NORVASC ) 5 MG tablet    Sig: Take 1 tablet (5 mg total) by mouth daily.    Dispense:  90 tablet    Refill:  1   buPROPion  (WELLBUTRIN  XL) 300 MG 24 hr tablet    Sig: Take 1 tablet (300 mg total) by mouth daily.    Dispense:  90 tablet    Refill:  1   lisinopril  (ZESTRIL ) 40 MG tablet    Sig: Take 1 tablet (40 mg total) by mouth daily.    Dispense:  90 tablet    Refill:  2   meloxicam  (MOBIC ) 15 MG tablet    Sig: Take 1 tablet (15 mg total) by mouth daily.    Dispense:  90 tablet    Refill:  1   rosuvastatin  (CRESTOR ) 10 MG tablet    Sig: Take 1 tablet (10 mg total) by mouth daily. For high cholesterol    Dispense:  90 tablet    Refill:  1   Vitamin D , Ergocalciferol , (DRISDOL ) 1.25 MG (50000 UNIT) CAPS capsule    Sig: Take 1 capsule (50,000 Units total) by mouth every 7 (seven) days.    Dispense:  12 capsule    Refill:  0   hydrochlorothiazide  (MICROZIDE ) 12.5 MG capsule    Sig: Take 1 capsule (12.5 mg total) by mouth daily.    Dispense:  90 capsule    Refill:  1    Fill new script today    Return in about 6 months (around 11/04/2024) for F/U, Alex Bowers PCP.   Total time spent:30 Minutes Time spent includes review of chart, medications, test results, and follow up plan with the patient.   [place] Controlled Substance Database was reviewed by me.  This patient was seen by Mardy Maxin, FNP-C in collaboration with Dr. Sigrid Bathe as a part of collaborative care agreement.  Cambre Matson R. Maxin, MSN, FNP-C Internal medicine

## 2024-05-18 DIAGNOSIS — Z1212 Encounter for screening for malignant neoplasm of rectum: Secondary | ICD-10-CM | POA: Diagnosis not present

## 2024-05-24 LAB — COLOGUARD: COLOGUARD: POSITIVE — AB

## 2024-05-26 NOTE — Progress Notes (Signed)
 Alex Bowers                                          MRN: [id number]   05/26/2024   The [facility] Quality Team Specialist reviewed this patient medical record for the purposes of chart review for care gap closure. The following were reviewed: abstraction for care gap closure-colorectal cancer screening.    [facility] Quality Team

## 2024-06-10 ENCOUNTER — Ambulatory Visit: Payer: Self-pay | Admitting: Internal Medicine

## 2024-06-11 NOTE — Progress Notes (Signed)
 Pt advised that his cologuard is positive we will go head add referral for GI

## 2024-06-11 NOTE — Progress Notes (Signed)
 [name] to call us back

## 2024-06-14 ENCOUNTER — Other Ambulatory Visit: Payer: Self-pay | Admitting: Nurse Practitioner

## 2024-06-14 DIAGNOSIS — R600 Localized edema: Secondary | ICD-10-CM | POA: Insufficient documentation

## 2024-06-14 DIAGNOSIS — I2510 Atherosclerotic heart disease of native coronary artery without angina pectoris: Secondary | ICD-10-CM | POA: Insufficient documentation

## 2024-06-14 DIAGNOSIS — R195 Other fecal abnormalities: Secondary | ICD-10-CM

## 2024-06-14 DIAGNOSIS — J432 Centrilobular emphysema: Secondary | ICD-10-CM | POA: Insufficient documentation

## 2024-06-14 NOTE — Progress Notes (Unsigned)
 Positive cologuard, referred to GI for follow up colonoscopy. Patient has already been notified of plan

## 2024-06-15 ENCOUNTER — Telehealth: Payer: Self-pay | Admitting: Internal Medicine

## 2024-06-15 DIAGNOSIS — B0230 Zoster ocular disease, unspecified: Secondary | ICD-10-CM | POA: Diagnosis not present

## 2024-06-15 DIAGNOSIS — H2511 Age-related nuclear cataract, right eye: Secondary | ICD-10-CM | POA: Diagnosis not present

## 2024-06-15 NOTE — Telephone Encounter (Signed)
 Lvm to schedule appointment with [facility]

## 2024-06-17 ENCOUNTER — Telehealth: Payer: Self-pay | Admitting: Nurse Practitioner

## 2024-06-17 NOTE — Telephone Encounter (Signed)
 GI referral sent via [facility] to [facility] on 06/15/24.

## 2024-06-17 NOTE — Telephone Encounter (Signed)
 Left 2nd vm to schedule f/u with [facility]

## 2024-06-24 ENCOUNTER — Telehealth: Payer: Self-pay | Admitting: Nurse Practitioner

## 2024-06-24 NOTE — Telephone Encounter (Signed)
 Gastroenterology appointment 07/29/2024 with [facility]

## 2024-06-30 ENCOUNTER — Encounter: Payer: Self-pay | Admitting: Internal Medicine

## 2024-06-30 ENCOUNTER — Ambulatory Visit: Admitting: Internal Medicine

## 2024-06-30 VITALS — BP 135/65 | HR 78 | Temp 98.0°F | Resp 16 | Ht 68.0 in | Wt 146.0 lb

## 2024-06-30 DIAGNOSIS — D692 Other nonthrombocytopenic purpura: Secondary | ICD-10-CM | POA: Diagnosis not present

## 2024-06-30 DIAGNOSIS — I10 Essential (primary) hypertension: Secondary | ICD-10-CM

## 2024-06-30 DIAGNOSIS — I25118 Atherosclerotic heart disease of native coronary artery with other forms of angina pectoris: Secondary | ICD-10-CM

## 2024-06-30 DIAGNOSIS — J4489 Other specified chronic obstructive pulmonary disease: Secondary | ICD-10-CM | POA: Diagnosis not present

## 2024-06-30 NOTE — Progress Notes (Signed)
 [facility] [address] [place], [place] [address]  Internal MEDICINE  Office Visit Note  Patient Name: Alex Bowers  [id number]  [id number]  Date of Service: 06/30/2024  Chief Complaint  Patient presents with   Follow-up    HTN, Hyperlipidemia    HPI Pt is here for f/u Cologuard is positive, GI was consulted for colonoscopy  Smokeless nicotine use, abnormal CT chest in the past with multiple abnormal findings IMPRESSION: a. Lung-RADS 2, benign appearance or behavior. Continue annual screening with low-dose chest CT without contrast in 12 months. b. Three-vessel coronary atherosclerosis. c. Aortic Atherosclerosis (ICD10-I70.0) and Emphysema (ICD10-J43.9  3. He continues to have SOB with minimal exertion 4. Hyperkalemia on recent labs, he is on Ace inh   5. C/O skin pigmentation    Current Medication: Outpatient Encounter Medications as of 06/30/2024  Medication Sig   amLODipine  (NORVASC ) 5 MG tablet Take 1 tablet (5 mg total) by mouth daily.   aspirin EC 81 MG tablet Take 81 mg by mouth daily.   buPROPion  (WELLBUTRIN  XL) 300 MG 24 hr tablet Take 1 tablet (300 mg total) by mouth daily.   finasteride  (PROSCAR ) 5 MG tablet TAKE 1 TABLET (5 MG TOTAL) BY MOUTH DAILY.   hydrochlorothiazide  (MICROZIDE ) 12.5 MG capsule Take 1 capsule (12.5 mg total) by mouth daily.   lisinopril  (ZESTRIL ) 40 MG tablet Take 1 tablet (40 mg total) by mouth daily.   meloxicam  (MOBIC ) 15 MG tablet Take 1 tablet (15 mg total) by mouth daily.   rosuvastatin  (CRESTOR ) 10 MG tablet Take 1 tablet (10 mg total) by mouth daily. For high cholesterol   tadalafil  (CIALIS ) 5 MG tablet Take 1 tablet (5 mg total) by mouth daily as needed for erectile dysfunction.   Vitamin D , Ergocalciferol , (DRISDOL ) 1.25 MG (50000 UNIT) CAPS capsule Take 1 capsule (50,000 Units total) by mouth every 7 (seven) days.   No facility-administered encounter medications on file as of 06/30/2024.    Surgical  History: Past Surgical History:  Procedure Laterality Date   COLONOSCOPY  2015   CYSTOSCOPY WITH INSERTION OF UROLIFT N/A 06/03/2023   Procedure: CYSTOSCOPY WITH INSERTION OF UROLIFT;  Surgeon: Penne Knee, MD;  Location: [facility] ORS;  Service: Urology;  Laterality: N/A;   HERNIA REPAIR Right 06/09/14   inguinal    teeth removal       Medical History: Past Medical History:  Diagnosis Date   BPH (benign prostatic hyperplasia)    Depression    Dysplastic nevus 07/08/2014   Right proximal lateral deltoid. Sever atypia, lateral margin involved. Excised 09/07/2014, margins free.   Dysplastic nevus 11/16/2014   Right distal post. deltoid. Melanocytic hyperplasia and incidental dysplastic nevus with mild atypia, margins free. Excised.   Dysplastic nevus 07/03/2021   L lat buttocks - moderate   Dyspnea    Headache    younger   History of dysplastic nevus 05/18/2020   left anterior deltoid, severe / shave removal   Hyperlipidemia    Hypertension    Pelvic floor dysfunction    Personal history of tobacco use, presenting hazards to health 12/29/2015   Stroke [facility]) 2014    Family History: Family History  Problem Relation Age of Onset   Colon polyps Mother    Heart attack Father    Cancer - Other Brother        phageal   Prostate cancer Neg Hx    Kidney cancer Neg Hx    Bladder Cancer Neg Hx  Social History   Socioeconomic History   Marital status: Divorced    Spouse name: Not on file   Number of children: Not on file   Years of education: Not on file   Highest education level: Not on file  Occupational History   Not on file  Tobacco Use   Smoking status: Every Day    Types: Pipe    Start date: 02/19/2022   Smokeless tobacco: Never   Tobacco comments:    No more smoking only vaping now 10/31/23.  Vaping Use   Vaping status: Every Day   Substances: Nicotine, Flavoring  Substance and Sexual Activity   Alcohol use: Yes    Comment: occasionally   Drug use: No    Sexual activity: Not on file  Other Topics Concern   Not on file  Social History Narrative   Not on file   Social Drivers of Health   Financial Resource Strain: Not on file  Food Insecurity: Not on file  Transportation Needs: Not on file  Physical Activity: Not on file  Stress: Not on file  Social Connections: Not on file  Intimate Partner Violence: Not on file      Review of Systems  Constitutional:  Negative for fatigue and fever.  HENT:  Negative for congestion, mouth sores and postnasal drip.   Respiratory:  Positive for shortness of breath. Negative for cough.   Cardiovascular:  Negative for chest pain.  Genitourinary:  Negative for flank pain.  Psychiatric/Behavioral: Negative.      Vital Signs: BP 135/65   Pulse 78   Temp 98 F (36.7 C)   Resp 16   Ht 5' 8 (1.727 m)   Wt 146 lb (66.2 kg)   SpO2 99%   BMI 22.20 kg/m    Physical Exam Constitutional:      Appearance: Normal appearance.  HENT:     Head: Normocephalic and atraumatic.     Nose: Nose normal.     Mouth/Throat:     Mouth: Mucous membranes are moist.     Pharynx: No posterior oropharyngeal erythema.  Eyes:     Extraocular Movements: Extraocular movements intact.     Pupils: Pupils are equal, round, and reactive to light.  Cardiovascular:     Rate and Rhythm: Normal rate.     Pulses: Normal pulses.     Heart sounds: Normal heart sounds.  Pulmonary:     Effort: Pulmonary effort is normal.     Breath sounds: Wheezing present.  Neurological:     General: No focal deficit present.     Mental Status: He is alert.  Psychiatric:        Mood and Affect: Mood normal.        Behavior: Behavior normal.        Assessment/Plan: 1. COPD (chronic obstructive pulmonary disease) with chronic bronchitis (HCC) (Primary) Samples of Trelegy, one puff a day, rinse after each use  - Pulmonary function test; Future  2. Atherosclerosis of native coronary artery of native heart with other form of  angina pectoris Worsening sob, multifactorial, will need both cardiopulmonary work up - Ambulatory referral to Cardiology Pt is on crestor    3. Senile purpura Will monitor, avoid alcohol and asa  4. Benign hypertension Controlled, will continue All medications, recheck K if needed will decrease Lisinopril     General Counseling: Mare verbalizes understanding of the findings of todays visit and agrees with plan of treatment. I have discussed any further diagnostic evaluation that may be  needed or ordered today. We also reviewed his medications today. he has been encouraged to call the office with any questions or concerns that should arise related to todays visit.    Orders Placed This Encounter  Procedures   Ambulatory referral to Cardiology   Pulmonary function test    No orders of the defined types were placed in this encounter.   Total time spent:35 Minutes Time spent includes review of chart, medications, test results, and follow up plan with the patient.   [place] Controlled Substance Database was reviewed by me.   Dr Jeovani Weisenburger M Lexiana Spindel Internal medicine

## 2024-07-03 ENCOUNTER — Other Ambulatory Visit: Payer: Self-pay | Admitting: Nurse Practitioner

## 2024-07-03 DIAGNOSIS — G8929 Other chronic pain: Secondary | ICD-10-CM

## 2024-07-06 ENCOUNTER — Ambulatory Visit: Admitting: Dermatology

## 2024-07-06 DIAGNOSIS — W908XXA Exposure to other nonionizing radiation, initial encounter: Secondary | ICD-10-CM | POA: Diagnosis not present

## 2024-07-06 DIAGNOSIS — L905 Scar conditions and fibrosis of skin: Secondary | ICD-10-CM

## 2024-07-06 DIAGNOSIS — D229 Melanocytic nevi, unspecified: Secondary | ICD-10-CM

## 2024-07-06 DIAGNOSIS — L814 Other melanin hyperpigmentation: Secondary | ICD-10-CM

## 2024-07-06 DIAGNOSIS — Z86018 Personal history of other benign neoplasm: Secondary | ICD-10-CM

## 2024-07-06 DIAGNOSIS — L578 Other skin changes due to chronic exposure to nonionizing radiation: Secondary | ICD-10-CM

## 2024-07-06 DIAGNOSIS — Z1283 Encounter for screening for malignant neoplasm of skin: Secondary | ICD-10-CM | POA: Diagnosis not present

## 2024-07-06 DIAGNOSIS — D1801 Hemangioma of skin and subcutaneous tissue: Secondary | ICD-10-CM

## 2024-07-06 DIAGNOSIS — D692 Other nonthrombocytopenic purpura: Secondary | ICD-10-CM

## 2024-07-06 DIAGNOSIS — L821 Other seborrheic keratosis: Secondary | ICD-10-CM

## 2024-07-06 NOTE — Progress Notes (Unsigned)
   Follow-Up Visit   Subjective  Alex Bowers is a 68 y.o. male who presents for the following: Skin Cancer Screening and Full Body Skin Exam  The patient presents for Total-Body Skin Exam (TBSE) for skin cancer screening and mole check. The patient has spots, moles and lesions to be evaluated, some may be new or changing and the patient may have concern these could be cancer.  The following portions of the chart were reviewed this encounter and updated as appropriate: medications, allergies, medical history  Review of Systems:  No other skin or systemic complaints except as noted in HPI or Assessment and Plan.  Objective  Well appearing patient in no apparent distress; mood and affect are within normal limits.  A full examination was performed including scalp, head, eyes, ears, nose, lips, neck, chest, axillae, abdomen, back, buttocks, bilateral upper extremities, bilateral lower extremities, hands, feet, fingers, toes, fingernails, and toenails. All findings within normal limits unless otherwise noted below.   Relevant physical exam findings are noted in the Assessment and Plan.    Assessment & Plan   SKIN CANCER SCREENING PERFORMED TODAY.  ACTINIC DAMAGE - Chronic condition, secondary to cumulative UV/sun exposure - diffuse scaly erythematous macules with underlying dyspigmentation - Recommend daily broad spectrum sunscreen SPF 30+ to sun-exposed areas, reapply every 2 hours as needed.  - Staying in the shade or wearing long sleeves, sun glasses (UVA+UVB protection) and wide brim hats (4-inch brim around the entire circumference of the hat) are also recommended for sun protection.  - Call for new or changing lesions.  LENTIGINES, SEBORRHEIC KERATOSES, HEMANGIOMAS - Benign normal skin lesions - Benign-appearing - Call for any changes  MELANOCYTIC NEVI - Tan-brown and/or pink-flesh-colored symmetric macules and papules - Benign appearing on exam today -  Observation - Call clinic for new or changing moles - Recommend daily use of broad spectrum spf 30+ sunscreen to sun-exposed areas.   SCAR Exam: Dyspigmented smooth macule or patch. R glabella and nose from shingles Benign-appearing.  Observation.  Call clinic for new or changing lesions. Recommend daily broad spectrum sunscreen SPF 30+, reapply every 2 hours as needed. Treatment: Recommend Serica moisturizing scar formula cream every night or [facility] brand or Mederma silicone scar sheet every night for the first year after a scar appears to help with scar remodeling if desired. Scars remodel on their own for a full year and will gradually improve in appearance over time. Continue care with ophthalmologist.  HISTORY OF DYSPLASTIC NEVI No evidence of recurrence today Recommend regular full body skin exams Recommend daily broad spectrum sunscreen SPF 30+ to sun-exposed areas, reapply every 2 hours as needed.  Call if any new or changing lesions are noted between office visits  Purpura - Chronic; persistent and recurrent.  Treatable, but not curable. - Violaceous macules and patches - Benign - Related to trauma, age, sun damage and/or use of blood thinners, chronic use of topical and/or oral steroids - Observe - Can use OTC arnica containing moisturizer such as Dermend Bruise Formula if desired - Call for worsening or other concerns   Return in about 1 year (around 07/06/2025) for TBSE - hx dysplastic nevi.  LILLETTE Rosina Mayans, CMA, am acting as scribe for Alm Rhyme, MD .   Documentation: I have reviewed the above documentation for accuracy and completeness, and I agree with the above.  Alm Rhyme, MD

## 2024-07-06 NOTE — Patient Instructions (Signed)

## 2024-07-07 ENCOUNTER — Encounter: Payer: Self-pay | Admitting: Dermatology

## 2024-07-09 ENCOUNTER — Ambulatory Visit: Payer: 59 | Admitting: Dermatology

## 2024-07-14 ENCOUNTER — Ambulatory Visit: Attending: Cardiovascular Disease | Admitting: Cardiovascular Disease

## 2024-07-14 ENCOUNTER — Encounter: Payer: Self-pay | Admitting: Cardiovascular Disease

## 2024-07-14 VITALS — BP 128/70 | HR 64 | Ht 68.0 in | Wt 156.6 lb

## 2024-07-14 DIAGNOSIS — I714 Abdominal aortic aneurysm, without rupture, unspecified: Secondary | ICD-10-CM | POA: Diagnosis not present

## 2024-07-14 DIAGNOSIS — I25118 Atherosclerotic heart disease of native coronary artery with other forms of angina pectoris: Secondary | ICD-10-CM

## 2024-07-14 DIAGNOSIS — I209 Angina pectoris, unspecified: Secondary | ICD-10-CM

## 2024-07-14 DIAGNOSIS — I2510 Atherosclerotic heart disease of native coronary artery without angina pectoris: Secondary | ICD-10-CM

## 2024-07-14 DIAGNOSIS — I10 Essential (primary) hypertension: Secondary | ICD-10-CM

## 2024-07-14 DIAGNOSIS — R0602 Shortness of breath: Secondary | ICD-10-CM

## 2024-07-14 DIAGNOSIS — E785 Hyperlipidemia, unspecified: Secondary | ICD-10-CM

## 2024-07-14 MED ORDER — ROSUVASTATIN CALCIUM 20 MG PO TABS: 20.0000 mg | ORAL_TABLET | Freq: Every day | ORAL | 3 refills | Status: AC

## 2024-07-14 NOTE — Patient Instructions (Signed)
 Medication Instructions:  Your physician recommends the following medication changes.   INCREASE: Crestor  to 20 mg daily.  *If you need a refill on your cardiac medications before your next appointment, please call your pharmacy*  Lab Work: No labs ordered today    Testing/Procedures: Your provider has ordered a exercise tolerance test. This test will evaluate the blood supply to your heart muscle during periods of exercise and rest. For this test, you will raise your heart rate by walking on a treadmill at different levels.   you may eat a light breakfast/ lunch prior to your procedure no caffeine for 24 hours prior to your test (coffee, tea, soft drinks, or chocolate)  no smoking/ vaping for 4 hours prior to your test bring any inhalers with you to your test wear comfortable clothing & tennis/ non-skid shoes to walk on the treadmill  This will take place at [address] [facility])  [place] [phone]   Your physician has requested that you have an abdominal aorta duplex. During this test, an ultrasound is used to evaluate the aorta. Allow 30 minutes for this exam. Do not eat after midnight the day before and avoid carbonated beverages. This will take place at [address] [address], [place] [address]   Follow-Up: At [facility], you and your health needs are our priority.  As part of our continuing mission to provide you with exceptional heart care, our providers are all part of one team.  This team includes your primary Cardiologist (physician) and Advanced Practice Providers or APPs (Physician Assistants and Nurse Practitioners) who all work together to provide you with the care you need, when you need it.  Your next appointment:   2 month(s)  Provider:   You will see one of the following Advanced Practice Providers on your designated Care Team:   Lonni Meager, NP Lesley Maffucci, PA-C Bernardino Bring, PA-C Cadence Red Chute,  PA-C Tylene Lunch, NP Barnie Hila, NP

## 2024-07-14 NOTE — Progress Notes (Signed)
 Cardiology Office Note   Date:  07/14/2024   ID:  Alex Bowers, DOB [birth year], MRN [id number]  PCP:  Liana Fish, NP  Cardiologist:   Deatrice Cage, MD   Chief Complaint  Patient presents with   New Patient (Initial Visit)    New pt /  pt has been doing well with no complaints of chest pain, chest pressure or SOB, medication reviewed verbally with patient .       History of Present Illness: Alex Bowers is a 68 y.o. male who was referred by Dr. Fernand for evaluation of coronary and aortic atherosclerosis.  He has no prior cardiac history.  He has known history of essential hypertension, hyperlipidemia and tobacco use.  Used to smoke 2 packs/day but switched to vaping.  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 He had CT scan of the chest in 2021 for cancer screening.  The study was personally reviewed by me and it showed at least moderate coronary artery calcifications and moderate aortic calcifications.  He denies any chest pain but does report progressive exertional dyspnea.  No lower extremity claudication. He takes his medications regularly.    Past Medical History:  Diagnosis Date   BPH (benign prostatic hyperplasia)    Depression    Dysplastic nevus 07/08/2014   Right proximal lateral deltoid. Sever atypia, lateral margin involved. Excised 09/07/2014, margins free.   Dysplastic nevus 11/16/2014   Right distal post. deltoid. Melanocytic hyperplasia and incidental dysplastic nevus with mild atypia, margins free. Excised.   Dysplastic nevus 07/03/2021   L lat buttocks - moderate   Dyspnea    Headache    younger   History of dysplastic nevus 05/18/2020   left anterior deltoid, severe / shave removal   Hyperlipidemia    Hypertension    Pelvic floor dysfunction    Personal history of tobacco use, presenting hazards to health 12/29/2015   Stroke [facility]) 2014    Past Surgical History:  Procedure Laterality Date   COLONOSCOPY   2015   CYSTOSCOPY WITH INSERTION OF UROLIFT N/A 06/03/2023   Procedure: CYSTOSCOPY WITH INSERTION OF UROLIFT;  Surgeon: Penne Knee, MD;  Location: [facility] ORS;  Service: Urology;  Laterality: N/A;   HERNIA REPAIR Right 06/09/14   inguinal    teeth removal        Current Outpatient Medications  Medication Sig Dispense Refill   amLODipine  (NORVASC ) 5 MG tablet Take 1 tablet (5 mg total) by mouth daily. 90 tablet 1   aspirin EC 81 MG tablet Take 81 mg by mouth daily.     buPROPion  (WELLBUTRIN  XL) 300 MG 24 hr tablet Take 1 tablet (300 mg total) by mouth daily. 90 tablet 1   finasteride  (PROSCAR ) 5 MG tablet TAKE 1 TABLET (5 MG TOTAL) BY MOUTH DAILY. 90 tablet 3   hydrochlorothiazide  (MICROZIDE ) 12.5 MG capsule Take 1 capsule (12.5 mg total) by mouth daily. 90 capsule 1   lisinopril  (ZESTRIL ) 40 MG tablet Take 1 tablet (40 mg total) by mouth daily. 90 tablet 2   meloxicam  (MOBIC ) 15 MG tablet Take 1 tablet (15 mg total) by mouth daily. 90 tablet 1   rosuvastatin  (CRESTOR ) 10 MG tablet Take 1 tablet (10 mg total) by mouth daily. For high cholesterol 90 tablet 1   tadalafil  (CIALIS ) 5 MG tablet Take 1 tablet (5 mg total) by mouth daily as needed for erectile dysfunction. 90 tablet 3   Vitamin D , Ergocalciferol , (DRISDOL ) 1.25 MG (  50000 UNIT) CAPS capsule Take 1 capsule (50,000 Units total) by mouth every 7 (seven) days. 12 capsule 0   No current facility-administered medications for this visit.    Allergies:   Patient has no known allergies.    Social History:  The patient  reports that he has been smoking pipe. He started smoking about 2 years ago. He has never used smokeless tobacco. He reports current alcohol use. He reports that he does not use drugs.   Family History:  The patient's family history includes Cancer - Other in his brother; Colon polyps in his mother; Heart attack in his father.    ROS:  Please see the history of present illness.   Otherwise, review of systems are  positive for none.   All other systems are reviewed and negative.    PHYSICAL EXAM: VS:  BP 128/70 (BP Location: Right Arm, Patient Position: Sitting, Cuff Size: Normal)   Pulse 64   Ht 5' 8 (1.727 m)   Wt 156 lb 9.6 oz (71 kg)   SpO2 99%   BMI 23.81 kg/m  , BMI Body mass index is 23.81 kg/m. GEN: Well nourished, well developed, in no acute distress  HEENT: normal  Neck: no JVD, carotid bruits, or masses Cardiac: RRR; no murmurs, rubs, or gallops,no edema  Respiratory:  clear to auscultation bilaterally, normal work of breathing GI: soft, nontender, nondistended, + BS MS: no deformity or atrophy  Skin: warm and dry, no rash Neuro:  Strength and sensation are intact Psych: euthymic mood, full affect Vascular: Dorsalis pedis is +1 bilaterally.   EKG:  EKG is ordered today. The ekg ordered today demonstrates : Normal sinus rhythm Normal ECG When compared with ECG of 27-May-2023 13:58, Nonspecific T wave abnormality now evident in Lateral leads    Recent Labs: 04/07/2024: ALT 29; BUN 14; Creatinine, Ser 1.10; Hemoglobin 13.8; Platelets 280; Potassium 5.9; Sodium 134    Lipid Panel    Component Value Date/Time   CHOL 154 04/07/2024 1606   CHOL 233 (H) 07/09/2013 0407   TRIG 59 04/07/2024 1606   TRIG 97 07/09/2013 0407   HDL 76 04/07/2024 1606   HDL 35 (L) 07/09/2013 0407   CHOLHDL 2.0 04/07/2024 1606   VLDL 19 07/09/2013 0407   LDLCALC 66 04/07/2024 1606   LDLCALC 179 (H) 07/09/2013 0407      Wt Readings from Last 3 Encounters:  07/14/24 156 lb 9.6 oz (71 kg)  06/30/24 146 lb (66.2 kg)  05/07/24 146 lb (66.2 kg)          07/14/2024    1:57 PM  PAD Screen  Previous surgical procedure? No  Pain with walking? No  Subsides with rest? Yes  Feet/toe relief with dangling? No  Painful, non-healing ulcers? No  Extremities discolored? No      ASSESSMENT AND PLAN:  1.  Coronary artery disease involving native coronary arteries: Exertional dyspnea could be  anginal equivalent.  Previous CT scan in 2021 showed at least moderate coronary artery calcifications.  Recommend continuing aspirin 81 mg once daily.  I requested a treadmill stress test to evaluate for obstructive disease.  Continue treatment of risk factors.  2.  Abdominal aortic aneurysm screening: Given the presence of aortic calcifications and his smoking history, recommend abdominal ultrasound for aneurysm screening.  This was requested today.  He has no convincing symptoms of claudication.  He does have mildly diminished distal pulses.  3.  Hyperlipidemia: I recommend a target LDL of less than  55.  I elected to increase rosuvastatin  20 mg daily.  4.  Essential hypertension: Blood pressure is controlled on current medications.    Disposition:   FU in 2 months.  Signed,  Deatrice Cage, MD  07/14/2024 3:41 PM    [facility]

## 2024-07-15 ENCOUNTER — Other Ambulatory Visit: Payer: Self-pay | Admitting: Nurse Practitioner

## 2024-07-15 DIAGNOSIS — E559 Vitamin D deficiency, unspecified: Secondary | ICD-10-CM

## 2024-07-22 ENCOUNTER — Ambulatory Visit: Attending: Cardiovascular Disease

## 2024-07-22 DIAGNOSIS — I714 Abdominal aortic aneurysm, without rupture, unspecified: Secondary | ICD-10-CM

## 2024-07-24 ENCOUNTER — Ambulatory Visit: Payer: Self-pay | Admitting: Cardiovascular Disease

## 2024-07-27 ENCOUNTER — Telehealth (HOSPITAL_COMMUNITY): Payer: Self-pay

## 2024-07-27 NOTE — Telephone Encounter (Signed)
 Detailed instructions left on the patient's answering machine. Alex Bowers CCT

## 2024-07-28 ENCOUNTER — Inpatient Hospital Stay (HOSPITAL_COMMUNITY)
Admission: RE | Admit: 2024-07-28 | Discharge: 2024-07-28 | Attending: Cardiovascular Disease | Admitting: Cardiovascular Disease

## 2024-07-28 DIAGNOSIS — R0602 Shortness of breath: Secondary | ICD-10-CM

## 2024-07-28 LAB — EXERCISE TOLERANCE TEST
Angina Index: 0
Duke Treadmill Score: 5
Estimated workload: 6.4
Exercise duration (min): 4 min
Exercise duration (sec): 30 s
MPHR: 153 {beats}/min
Peak HR: 151 {beats}/min
Percent HR: 98 %
Rest HR: 81 {beats}/min
ST Depression (mm): 0 mm

## 2024-08-11 ENCOUNTER — Telehealth: Payer: Self-pay | Admitting: Nurse Practitioner

## 2024-08-11 NOTE — Telephone Encounter (Signed)
 Lvm to move 11/05/2024 appointment -Alex Bowers

## 2024-08-26 ENCOUNTER — Telehealth: Payer: Self-pay

## 2024-08-26 ENCOUNTER — Ambulatory Visit: Admitting: Internal Medicine

## 2024-08-26 DIAGNOSIS — J4489 Other specified chronic obstructive pulmonary disease: Secondary | ICD-10-CM | POA: Diagnosis not present

## 2024-08-26 DIAGNOSIS — R252 Cramp and spasm: Secondary | ICD-10-CM

## 2024-09-03 NOTE — Procedures (Signed)
 [facility] [address] [place], [address]    Complete Pulmonary Function Testing Interpretation:  FINDINGS:  Forced vital capacity is normal.  FEV1 is 2.28 L which is 74% predicted and is mildly decreased.  FEV1 FVC ratio is decreased.  Postbronchodilator no significant change was noted in the FEV1.  Total lung capacity is increased residual volume is increased.  FRC is increased.  DLCO is severely decreased.  IMPRESSION:  This pulmonary function study is suggestive of mild obstructive lung disease clinical correlation is recommended.  Alex DELENA Bathe, MD [facility] Pulmonary Critical Care Medicine Sleep Medicine

## 2024-09-07 LAB — PULMONARY FUNCTION TEST

## 2024-09-08 ENCOUNTER — Ambulatory Visit: Admitting: Internal Medicine

## 2024-09-08 VITALS — BP 137/96 | HR 70 | Temp 98.0°F | Resp 16 | Ht 68.0 in | Wt 161.4 lb

## 2024-09-08 DIAGNOSIS — J4489 Other specified chronic obstructive pulmonary disease: Secondary | ICD-10-CM

## 2024-09-08 DIAGNOSIS — F17290 Nicotine dependence, other tobacco product, uncomplicated: Secondary | ICD-10-CM

## 2024-09-08 MED ORDER — BREZTRI AEROSPHERE 160-9-4.8 MCG/ACT IN AERO: 2.0000 | INHALATION_SPRAY | Freq: Two times a day (BID) | RESPIRATORY_TRACT | 11 refills | Status: DC

## 2024-09-08 NOTE — Progress Notes (Unsigned)
 [facility] [address] [place], [place] [address]  Pulmonary Sleep Medicine   Office Visit Note  Patient Name: Alex Bowers DOB: May 22, 1956 MRN [id number]  Date of Service: 09/08/2024  Complaints/HPI: He is here for COPD. He had PFT done and this shows FEV1 of 74%. He states he used to smoke 2PPD and now he states he vapes. He states he does experience shortness of breath. Currently he states he does not use any inhalers. He states minimal cough noted. Denies having chest pain. He states he has a hard time quitting nicotine  Office Spirometry Results:     ROS  General: (-) fever, (-) chills, (-) night sweats, (-) weakness Skin: (-) rashes, (-) itching,. Eyes: (-) visual changes, (-) redness, (-) itching. Nose and Sinuses: (-) nasal stuffiness or itchiness, (-) postnasal drip, (-) nosebleeds, (-) sinus trouble. Mouth and Throat: (-) sore throat, (-) hoarseness. Neck: (-) swollen glands, (-) enlarged thyroid, (-) neck pain. Respiratory: + cough, (-) bloody sputum, + shortness of breath, - wheezing. Cardiovascular: - ankle swelling, (-) chest pain. Lymphatic: (-) lymph node enlargement. Neurologic: (-) numbness, (-) tingling. Psychiatric: (-) anxiety, (-) depression   Current Medication: Outpatient Encounter Medications as of 09/08/2024  Medication Sig   amLODipine  (NORVASC ) 5 MG tablet Take 1 tablet (5 mg total) by mouth daily.   aspirin EC 81 MG tablet Take 81 mg by mouth daily.   buPROPion  (WELLBUTRIN  XL) 300 MG 24 hr tablet Take 1 tablet (300 mg total) by mouth daily.   finasteride  (PROSCAR ) 5 MG tablet TAKE 1 TABLET (5 MG TOTAL) BY MOUTH DAILY.   hydrochlorothiazide  (MICROZIDE ) 12.5 MG capsule Take 1 capsule (12.5 mg total) by mouth daily.   lisinopril  (ZESTRIL ) 40 MG tablet Take 1 tablet (40 mg total) by mouth daily.   meloxicam  (MOBIC ) 15 MG tablet Take 1 tablet (15 mg total) by mouth daily.   rosuvastatin  (CRESTOR ) 20 MG tablet Take 1 tablet  (20 mg total) by mouth daily. For high cholesterol   tadalafil  (CIALIS ) 5 MG tablet Take 1 tablet (5 mg total) by mouth daily as needed for erectile dysfunction.   Vitamin D , Ergocalciferol , (DRISDOL ) 1.25 MG (50000 UNIT) CAPS capsule TAKE ONE CAPSULE (50,000 UNITS TOTAL) BY MOUTH EVERY 7 DAYS   No facility-administered encounter medications on file as of 09/08/2024.    Surgical History: Past Surgical History:  Procedure Laterality Date   COLONOSCOPY  2015   CYSTOSCOPY WITH INSERTION OF UROLIFT N/A 06/03/2023   Procedure: CYSTOSCOPY WITH INSERTION OF UROLIFT;  Surgeon: Penne Knee, MD;  Location: [facility] ORS;  Service: Urology;  Laterality: N/A;   HERNIA REPAIR Right 06/09/14   inguinal    teeth removal       Medical History: Past Medical History:  Diagnosis Date   BPH (benign prostatic hyperplasia)    Depression    Dysplastic nevus 07/08/2014   Right proximal lateral deltoid. Sever atypia, lateral margin involved. Excised 09/07/2014, margins free.   Dysplastic nevus 11/16/2014   Right distal post. deltoid. Melanocytic hyperplasia and incidental dysplastic nevus with mild atypia, margins free. Excised.   Dysplastic nevus 07/03/2021   L lat buttocks - moderate   Dyspnea    Headache    younger   History of dysplastic nevus 05/18/2020   left anterior deltoid, severe / shave removal   Hyperlipidemia    Hypertension    Pelvic floor dysfunction    Personal history of tobacco use, presenting hazards to health 12/29/2015   Stroke (HCC)  2014    Family History: Family History  Problem Relation Age of Onset   Colon polyps Mother    Heart attack Father    Cancer - Other Brother        phageal   Prostate cancer Neg Hx    Kidney cancer Neg Hx    Bladder Cancer Neg Hx     Social History: Social History   Socioeconomic History   Marital status: Divorced    Spouse name: Not on file   Number of children: Not on file   Years of education: Not on file   Highest education  level: Not on file  Occupational History   Not on file  Tobacco Use   Smoking status: Every Day    Types: Pipe    Start date: 02/19/2022   Smokeless tobacco: Never   Tobacco comments:    No more smoking only vaping now 10/31/23.  Vaping Use   Vaping status: Every Day   Substances: Nicotine, Flavoring  Substance and Sexual Activity   Alcohol use: Yes    Comment: occasionally   Drug use: No   Sexual activity: Not on file  Other Topics Concern   Not on file  Social History Narrative   Not on file   Social Drivers of Health   Tobacco Use: High Risk (07/29/2024)   Received from [facility]   Patient History    Smoking Tobacco Use: Every Day    Smokeless Tobacco Use: Never    Passive Exposure: Not on file  Financial Resource Strain: Not on file  Food Insecurity: Not on file  Transportation Needs: Not on file  Physical Activity: Not on file  Stress: Not on file  Social Connections: Not on file  Intimate Partner Violence: Not on file  Depression (PHQ2-9): Low Risk (10/31/2023)   Depression (PHQ2-9)    PHQ-2 Score: 0  Alcohol Screen: Low Risk (06/30/2024)   Alcohol Screen    Last Alcohol Screening Score (AUDIT): 3  Housing: Not on file  Utilities: Not on file  Health Literacy: Not on file    Vital Signs: Blood pressure (!) 137/96, pulse 70, temperature 98 F (36.7 C), resp. rate 16, height 5' 8 (1.727 m), weight 161 lb 6.4 oz (73.2 kg), SpO2 99%.  Examination: General Appearance: The patient is well-developed, well-nourished, and in no distress. Skin: Gross inspection of skin unremarkable. Head: normocephalic, no gross deformities. Eyes: no gross deformities noted. ENT: ears appear grossly normal no exudates. Neck: Supple. No thyromegaly. No LAD. Respiratory: few rhonchi noted. Cardiovascular: Normal S1 and S2 without murmur or rub. Extremities: No cyanosis. pulses are equal. Neurologic: Alert and oriented. No involuntary  movements.  LABS: Recent Results (from the past 2160 hours)  EXERCISE TOLERANCE TEST (ETT)     Status: None   Collection Time: 07/28/24  2:27 PM  Result Value Ref Range   Angina Index 0    Rest HR 81.0 bpm   Rest BP 152/91 mmHg   Exercise duration (min) 4 min   Exercise duration (sec) 30 sec   Estimated workload 6.4    Peak HR 151 bpm   Peak BP 201/102 mmHg   MPHR 153 bpm   Percent HR 98.0 %   Duke Treadmill Score 5    ST Depression (mm) 0 mm  Pulmonary Function Test     Status: None   Collection Time: 09/07/24  3:10 PM  Result Value Ref Range   FEV1     FVC  FEV1/FVC     TLC     DLCO      Radiology: EXERCISE TOLERANCE TEST (ETT) Result Date: 07/28/2024   Patient exercised for 4 minutes and 30 seconds on the Bruce protocol achieving a maximum heart rate of 153 bpm which was 98% of the predicted maximum.   There were no ST segment changes   Overall low risk exercise treadmill test with no electrocardiographic evidence of ischemia.    No results found.  No results found.  Assessment and Plan: Patient Active Problem List   Diagnosis Date Noted   Bilateral lower extremity edema 06/14/2024   3-vessel CAD 06/14/2024   Centrilobular emphysema (HCC) 06/14/2024   Cigarette smoker 12/14/2019   Chronic right shoulder pain 04/19/2019   Atherosclerosis of aorta 04/14/2018   Decreased visual acuity 04/14/2018   Atopic dermatitis and related condition 10/27/2017   Essential hypertension 10/27/2017   Mixed hyperlipidemia 10/27/2017   Moderate recurrent major depression (HCC) 10/27/2017   Attention and concentration deficit 10/27/2017   Erectile dysfunction 09/05/2015   Microscopic hematuria 09/05/2015   Hypogonadism in male 09/05/2015   Right inguinal hernia 05/18/2014    1. COPD (chronic obstructive pulmonary disease) with chronic bronchitis (HCC) (Primary) *** - budesonide-glycopyrrolate -formoterol (BREZTRI  AEROSPHERE) 160-9-4.8 MCG/ACT AERO inhaler; Inhale 2 puffs  into the lungs 2 (two) times daily.  Dispense: 10.7 g; Refill: 11  2. Vaping nicotine dependence, tobacco product ***    General Counseling: I have discussed the findings of the evaluation and examination with Lonni.  I have also discussed any further diagnostic evaluation thatmay be needed or ordered today. Donya verbalizes understanding of the findings of todays visit. We also reviewed his medications today and discussed drug interactions and side effects including but not limited excessive drowsiness and altered mental states. We also discussed that there is always a risk not just to him but also people around him. he has been encouraged to call the office with any questions or concerns that should arise related to todays visit.  No orders of the defined types were placed in this encounter.    Time spent: 64  I have personally obtained a history, examined the patient, evaluated laboratory and imaging results, formulated the assessment and plan and placed orders.    Elfreda DELENA Bathe, MD [facility] Pulmonary and Critical Care Sleep medicine

## 2024-09-16 ENCOUNTER — Ambulatory Visit: Attending: Cardiovascular Disease | Admitting: Cardiovascular Disease

## 2024-09-16 ENCOUNTER — Encounter: Payer: Self-pay | Admitting: Cardiovascular Disease

## 2024-09-16 VITALS — BP 124/84 | HR 79 | Ht 68.0 in | Wt 160.2 lb

## 2024-09-16 DIAGNOSIS — I2510 Atherosclerotic heart disease of native coronary artery without angina pectoris: Secondary | ICD-10-CM

## 2024-09-16 DIAGNOSIS — Z720 Tobacco use: Secondary | ICD-10-CM

## 2024-09-16 DIAGNOSIS — I714 Abdominal aortic aneurysm, without rupture, unspecified: Secondary | ICD-10-CM

## 2024-09-16 DIAGNOSIS — I10 Essential (primary) hypertension: Secondary | ICD-10-CM

## 2024-09-16 DIAGNOSIS — E785 Hyperlipidemia, unspecified: Secondary | ICD-10-CM

## 2024-09-16 NOTE — Progress Notes (Unsigned)
 "    Cardiology Office Note   Date:  09/17/2024   ID:  WADDELL ITEN, DOB 1956/06/13, MRN [id number]  PCP:  Liana Fish, NP  Cardiologist:   Deatrice Cage, MD   Chief Complaint  Patient presents with   Follow-up    F/u ETT/AAA 2 month f/u c/o leg cramping and itching with hydrochlorothiazide . Meds reviewed verbally with pt.      History of Present Illness: Alex Bowers is a 69 y.o. male who is here today for a follow-up visit regarding coronary and aortic atherosclerosis.   He has known history of essential hypertension, hyperlipidemia and tobacco use.  Used to smoke 2 packs/day but switched to vaping.  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 He had CT scan of the chest in 2021 for cancer screening.  The study was personally reviewed by me and it showed at least moderate coronary artery calcifications and moderate aortic calcifications.   He underwent a treadmill stress test in December which showed no evidence of ischemia but there was evidence of significant deconditioning as he was able to exercise for 4 minutes and 30 seconds with deconditioned heart rate response.  Abdominal aortic ultrasound showed no evidence of aortic aneurysm but there was mild iliac disease.  He reports no change in symptoms.  He does have some cramps in his legs and he thinks it is due to hydrochlorothiazide  which was discontinued with some improvement.   Past Medical History:  Diagnosis Date   BPH (benign prostatic hyperplasia)    Depression    Dysplastic nevus 07/08/2014   Right proximal lateral deltoid. Sever atypia, lateral margin involved. Excised 09/07/2014, margins free.   Dysplastic nevus 11/16/2014   Right distal post. deltoid. Melanocytic hyperplasia and incidental dysplastic nevus with mild atypia, margins free. Excised.   Dysplastic nevus 07/03/2021   L lat buttocks - moderate   Dyspnea    Headache    younger   History of dysplastic nevus  05/18/2020   left anterior deltoid, severe / shave removal   Hyperlipidemia    Hypertension    Pelvic floor dysfunction    Personal history of tobacco use, presenting hazards to health 12/29/2015   Stroke [facility]) 2014    Past Surgical History:  Procedure Laterality Date   COLONOSCOPY  2015   CYSTOSCOPY WITH INSERTION OF UROLIFT N/A 06/03/2023   Procedure: CYSTOSCOPY WITH INSERTION OF UROLIFT;  Surgeon: Penne Knee, MD;  Location: [facility] ORS;  Service: Urology;  Laterality: N/A;   HERNIA REPAIR Right 06/09/14   inguinal    teeth removal        Current Outpatient Medications  Medication Sig Dispense Refill   amLODipine  (NORVASC ) 5 MG tablet Take 1 tablet (5 mg total) by mouth daily. 90 tablet 1   aspirin EC 81 MG tablet Take 81 mg by mouth daily.     budesonide-glycopyrrolate -formoterol (BREZTRI  AEROSPHERE) 160-9-4.8 MCG/ACT AERO inhaler Inhale 2 puffs into the lungs 2 (two) times daily. 10.7 g 11   buPROPion  (WELLBUTRIN  XL) 300 MG 24 hr tablet Take 1 tablet (300 mg total) by mouth daily. 90 tablet 1   finasteride  (PROSCAR ) 5 MG tablet TAKE 1 TABLET (5 MG TOTAL) BY MOUTH DAILY. 90 tablet 3   hydrochlorothiazide  (MICROZIDE ) 12.5 MG capsule Take 1 capsule (12.5 mg total) by mouth daily. 90 capsule 1   lisinopril  (ZESTRIL ) 40 MG tablet Take 1 tablet (40 mg total) by mouth daily. 90 tablet 2   meloxicam  (MOBIC )  15 MG tablet Take 1 tablet (15 mg total) by mouth daily. 90 tablet 1   rosuvastatin  (CRESTOR ) 20 MG tablet Take 1 tablet (20 mg total) by mouth daily. For high cholesterol 90 tablet 3   tadalafil  (CIALIS ) 5 MG tablet Take 1 tablet (5 mg total) by mouth daily as needed for erectile dysfunction. 90 tablet 3   Vitamin D , Ergocalciferol , (DRISDOL ) 1.25 MG (50000 UNIT) CAPS capsule TAKE ONE CAPSULE (50,000 UNITS TOTAL) BY MOUTH EVERY 7 DAYS 12 capsule 0   No current facility-administered medications for this visit.    Allergies:   Patient has no known allergies.    Social History:   The patient  reports that he quit smoking about 2 years ago. His smoking use included cigarettes. He has never used smokeless tobacco. He reports current alcohol use. He reports that he does not use drugs.   Family History:  The patient's family history includes Cancer - Other in his brother; Colon polyps in his mother; Heart attack in his father.    ROS:  Please see the history of present illness.   Otherwise, review of systems are positive for none.   All other systems are reviewed and negative.    PHYSICAL EXAM: VS:  BP 124/84 (BP Location: Left Arm, Patient Position: Sitting, Cuff Size: Normal)   Pulse 79   Ht 5' 8 (1.727 m)   Wt 160 lb 4 oz (72.7 kg)   SpO2 99%   BMI 24.37 kg/m  , BMI Body mass index is 24.37 kg/m. GEN: Well nourished, well developed, in no acute distress  HEENT: normal  Neck: no JVD, carotid bruits, or masses Cardiac: RRR; no murmurs, rubs, or gallops,no edema  Respiratory:  clear to auscultation bilaterally, normal work of breathing GI: soft, nontender, nondistended, + BS MS: no deformity or atrophy  Skin: warm and dry, no rash Neuro:  Strength and sensation are intact Psych: euthymic mood, full affect Vascular: Dorsalis pedis is +1 bilaterally.   EKG:  EKG is ordered today. The ekg ordered today demonstrates : Normal sinus rhythm Normal ECG When compared with ECG of 27-May-2023 13:58, Nonspecific T wave abnormality now evident in Lateral leads    Recent Labs: 04/07/2024: ALT 29; BUN 14; Creatinine, Ser 1.10; Hemoglobin 13.8; Platelets 280; Potassium 5.9; Sodium 134    Lipid Panel    Component Value Date/Time   CHOL 154 04/07/2024 1606   CHOL 233 (H) 07/09/2013 0407   TRIG 59 04/07/2024 1606   TRIG 97 07/09/2013 0407   HDL 76 04/07/2024 1606   HDL 35 (L) 07/09/2013 0407   CHOLHDL 2.0 04/07/2024 1606   VLDL 19 07/09/2013 0407   LDLCALC 66 04/07/2024 1606   LDLCALC 179 (H) 07/09/2013 0407      Wt Readings from Last 3 Encounters:   09/16/24 160 lb 4 oz (72.7 kg)  09/08/24 161 lb 6.4 oz (73.2 kg)  07/14/24 156 lb 9.6 oz (71 kg)          07/14/2024    1:57 PM  PAD Screen  Previous surgical procedure? No   Pain with walking? No   Subsides with rest? Yes   Feet/toe relief with dangling? No   Painful, non-healing ulcers? No   Extremities discolored? No      Manually entered by patient      ASSESSMENT AND PLAN:  1.  Coronary artery disease involving native coronary arteries without angina: Treadmill stress test showed no evidence of ischemia.  Recommend treatment of risk  factors and low-dose aspirin. I discussed with the patient the importance of lifestyle changes in order to decrease the chance of future coronary artery disease and cardiovascular events. We discussed the importance of controlling risk factors, healthy diet as well as regular exercise. I also explained to him that a normal stress test does not rule out atherosclerosis.   2.  Tobacco use: He stopped smoking but he vapes.  I discussed with him the importance of cessation.  3.  Hyperlipidemia: Continue treatment with rosuvastatin  20 mg daily with a target LDL of less than 55.  4.  Essential hypertension: Blood pressure is controlled on current medications.    Disposition:   FU in 12 months.  Signed,  Deatrice Cage, MD  09/17/2024 8:52 AM    [facility] "

## 2024-09-16 NOTE — Patient Instructions (Signed)
 Medication Instructions:  No changes *If you need a refill on your cardiac medications before your next appointment, please call your pharmacy*  Lab Work: None ordered If you have labs (blood work) drawn today and your tests are completely normal, you will receive your results only by: MyChart Message (if you have MyChart) OR A paper copy in the mail If you have any lab test that is abnormal or we need to change your treatment, we will call you to review the results.  Testing/Procedures: None ordered  Follow-Up: At [facility], you and your health needs are our priority.  As part of our continuing mission to provide you with exceptional heart care, our providers are all part of one team.  This team includes your primary Cardiologist (physician) and Advanced Practice Providers or APPs (Physician Assistants and Nurse Practitioners) who all work together to provide you with the care you need, when you need it.  Your next appointment:   12 month(s)  Provider:   You may see Dr. Kirke Corin or one of the following Advanced Practice Providers on your designated Care Team:   Nicolasa Ducking, NP Ames Dura, PA-C Eula Listen, PA-C Cadence Folsom, PA-C Charlsie Quest, NP Carlos Levering, NP    We recommend signing up for the patient portal called "MyChart".  Sign up information is provided on this After Visit Summary.  MyChart is used to connect with patients for Virtual Visits (Telemedicine).  Patients are able to view lab/test results, encounter notes, upcoming appointments, etc.  Non-urgent messages can be sent to your provider as well.   To learn more about what you can do with MyChart, go to [email or web address].

## 2024-09-18 NOTE — Telephone Encounter (Signed)
 [name] to call us back

## 2024-09-18 NOTE — Telephone Encounter (Signed)
 Pt notified that we ordered labs please do before we change med and pt don't have bp machine to check BP and also he still having leg cramp advised him to do labs

## 2024-09-19 LAB — BASIC METABOLIC PANEL WITH GFR
BUN/Creatinine Ratio: 12 (ref 10–24)
BUN: 14 mg/dL (ref 8–27)
CO2: 20 mmol/L (ref 20–29)
Calcium: 9.3 mg/dL (ref 8.6–10.2)
Chloride: 103 mmol/L (ref 96–106)
Creatinine, Ser: 1.2 mg/dL (ref 0.76–1.27)
Glucose: 96 mg/dL (ref 70–99)
Potassium: 5.6 mmol/L — ABNORMAL HIGH (ref 3.5–5.2)
Sodium: 138 mmol/L (ref 134–144)
eGFR: 66 mL/min/{1.73_m2}

## 2024-09-22 ENCOUNTER — Ambulatory Visit: Admitting: Certified Registered"

## 2024-09-22 ENCOUNTER — Other Ambulatory Visit: Payer: Self-pay | Admitting: Nurse Practitioner

## 2024-09-22 ENCOUNTER — Ambulatory Visit
Admission: RE | Admit: 2024-09-22 | Discharge: 2024-09-22 | Disposition: A | Attending: Gastroenterology | Admitting: Gastroenterology

## 2024-09-22 ENCOUNTER — Encounter: Admission: RE | Disposition: A | Payer: Self-pay | Source: Home / Self Care | Attending: Gastroenterology

## 2024-09-22 DIAGNOSIS — Z1211 Encounter for screening for malignant neoplasm of colon: Secondary | ICD-10-CM | POA: Insufficient documentation

## 2024-09-22 DIAGNOSIS — E559 Vitamin D deficiency, unspecified: Secondary | ICD-10-CM

## 2024-09-22 DIAGNOSIS — J4489 Other specified chronic obstructive pulmonary disease: Secondary | ICD-10-CM

## 2024-09-22 DIAGNOSIS — I10 Essential (primary) hypertension: Secondary | ICD-10-CM | POA: Insufficient documentation

## 2024-09-22 DIAGNOSIS — J449 Chronic obstructive pulmonary disease, unspecified: Secondary | ICD-10-CM | POA: Insufficient documentation

## 2024-09-22 DIAGNOSIS — Z87891 Personal history of nicotine dependence: Secondary | ICD-10-CM | POA: Insufficient documentation

## 2024-09-22 DIAGNOSIS — Z8673 Personal history of transient ischemic attack (TIA), and cerebral infarction without residual deficits: Secondary | ICD-10-CM | POA: Insufficient documentation

## 2024-09-22 DIAGNOSIS — K5730 Diverticulosis of large intestine without perforation or abscess without bleeding: Secondary | ICD-10-CM | POA: Insufficient documentation

## 2024-09-22 DIAGNOSIS — K641 Second degree hemorrhoids: Secondary | ICD-10-CM | POA: Insufficient documentation

## 2024-09-22 MED ORDER — GLYCOPYRROLATE 0.2 MG/ML IJ SOLN
INTRAMUSCULAR | Status: DC | PRN
  Administered 2024-09-22: .2 mg via INTRAVENOUS

## 2024-09-22 MED ORDER — PROPOFOL 500 MG/50ML IV EMUL
INTRAVENOUS | Status: DC | PRN
  Administered 2024-09-22: 165 ug/kg/min via INTRAVENOUS

## 2024-09-22 MED ORDER — SODIUM CHLORIDE 0.9 % IV SOLN
INTRAVENOUS | Status: DC
  Administered 2024-09-22: 500 mL via INTRAVENOUS

## 2024-09-22 MED ORDER — SODIUM CHLORIDE 0.9 % IV SOLN: INTRAVENOUS | Status: DC | PRN

## 2024-09-22 MED ORDER — LIDOCAINE HCL (CARDIAC) PF 100 MG/5ML IV SOSY
PREFILLED_SYRINGE | INTRAVENOUS | Status: DC | PRN
  Administered 2024-09-22: 100 mg via INTRAVENOUS

## 2024-09-22 MED ORDER — PROPOFOL 10 MG/ML IV BOLUS
INTRAVENOUS | Status: DC | PRN
  Administered 2024-09-22: 20 mg via INTRAVENOUS
  Administered 2024-09-22: 60 mg via INTRAVENOUS

## 2024-09-22 NOTE — Anesthesia Procedure Notes (Signed)
 Procedure Name: General with mask airway Date/Time: 09/22/2024 9:50 AM  Performed by: Ledora Duncan, CRNAPre-anesthesia Checklist: Patient identified, Emergency Drugs available, Suction available and Patient being monitored Patient Re-evaluated:Patient Re-evaluated prior to induction Oxygen Delivery Method: Simple face mask Induction Type: IV induction Placement Confirmation: positive ETCO2 and breath sounds checked- equal and bilateral Dental Injury: Teeth and Oropharynx as per pre-operative assessment

## 2024-09-22 NOTE — Interval H&P Note (Signed)
 History and Physical Interval Note:  09/22/2024 9:36 AM  Alex Bowers  has presented today for surgery, with the diagnosis of Colon cancer screening (Z12.11) Positive colorectal cancer screening using Cologuard test (R19.5).  The various methods of treatment have been discussed with the patient and family. After consideration of risks, benefits and other options for treatment, the patient has consented to  Procedures: COLONOSCOPY (N/A) as a surgical intervention.  The patient's history has been reviewed, patient examined, no change in status, stable for surgery.  I have reviewed the patient's chart and labs.  Questions were answered to the patient's satisfaction.     [name]  Ok to proceed with colonoscopy

## 2024-09-23 ENCOUNTER — Other Ambulatory Visit: Payer: Self-pay

## 2024-09-23 ENCOUNTER — Ambulatory Visit: Payer: Self-pay | Admitting: Nurse Practitioner

## 2024-09-23 DIAGNOSIS — J4489 Other specified chronic obstructive pulmonary disease: Secondary | ICD-10-CM

## 2024-09-23 MED ORDER — FUROSEMIDE 20 MG PO TABS: 20.0000 mg | ORAL_TABLET | Freq: Every day | ORAL | 3 refills | Status: AC

## 2024-09-23 MED ORDER — BREZTRI AEROSPHERE 160-9-4.8 MCG/ACT IN AERO: 2.0000 | INHALATION_SPRAY | Freq: Two times a day (BID) | RESPIRATORY_TRACT | 1 refills | Status: AC

## 2024-09-23 NOTE — Progress Notes (Signed)
 He does need to be on a diuretic. His potassium level is still elevated. Since he did not tolerate hydrochlorothiazide , I have switched him to furosemide  20 mg daily.

## 2024-09-23 NOTE — Progress Notes (Signed)
 Sent MyChart message

## 2024-09-23 NOTE — Progress Notes (Signed)
Pt notified for labs  

## 2024-11-05 ENCOUNTER — Ambulatory Visit: Admitting: Nurse Practitioner

## 2024-11-09 ENCOUNTER — Ambulatory Visit: Admitting: Nurse Practitioner

## 2025-03-09 ENCOUNTER — Ambulatory Visit: Admitting: Internal Medicine

## 2025-03-11 ENCOUNTER — Ambulatory Visit: Admitting: Urology

## 2025-05-10 ENCOUNTER — Ambulatory Visit: Admitting: Nurse Practitioner

## 2025-07-06 ENCOUNTER — Ambulatory Visit: Admitting: Dermatology

## 2025-09-01 ENCOUNTER — Encounter: Admitting: Internal Medicine
